# Patient Record
Sex: Female | Born: 1990 | Race: Black or African American | Hispanic: No | Marital: Single | Smoking: Never smoker
Health system: Southern US, Community
[De-identification: ages and names within clinical notes are randomized; demographics above are authoritative.]

## PROBLEM LIST (undated history)

## (undated) ENCOUNTER — Inpatient Hospital Stay (HOSPITAL_COMMUNITY): Payer: Self-pay

## (undated) DIAGNOSIS — Q613 Polycystic kidney, unspecified: Secondary | ICD-10-CM

## (undated) DIAGNOSIS — Z789 Other specified health status: Secondary | ICD-10-CM

---
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
## 2011-03-20 LAB — URINALYSIS, ROUTINE W REFLEX MICROSCOPIC
Glucose, UA: NEGATIVE
Ketones, ur: 15 — AB
Nitrite: NEGATIVE
Nitrite: POSITIVE — AB
Protein, ur: 30 — AB
Specific Gravity, Urine: >1.03 — ABNORMAL HIGH
Urobilinogen, UA: 0.2
Urobilinogen, UA: 1
Urobilinogen, UA: 0.2
pH: 6

## 2011-03-20 LAB — CBC
Hemoglobin: 11.4 — ABNORMAL LOW
MCHC: 34.3
RBC: 3.56 — ABNORMAL LOW
WBC: 9.4

## 2011-03-20 LAB — URINE MICROSCOPIC-ADD ON

## 2011-03-20 LAB — GC/CHLAMYDIA PROBE AMP, GENITAL
Chlamydia, DNA Probe: POSITIVE — AB
GC Probe Amp, Genital: NEGATIVE

## 2011-03-20 LAB — WET PREP, GENITAL

## 2011-03-20 LAB — URINE CULTURE

## 2011-03-20 LAB — PREGNANCY, URINE: Preg Test, Ur: POSITIVE

## 2011-03-20 LAB — HCG, QUANTITATIVE, PREGNANCY: hCG, Beta Chain, Quant, S: 51935 — ABNORMAL HIGH

## 2011-12-21 ENCOUNTER — Encounter (HOSPITAL_COMMUNITY): Payer: Self-pay | Admitting: *Deleted

## 2011-12-21 ENCOUNTER — Inpatient Hospital Stay (HOSPITAL_COMMUNITY)
Admission: AD | Admit: 2011-12-21 | Discharge: 2011-12-21 | Disposition: A | Discharge status: Home / Self Care | Payer: Self-pay | Source: Ambulatory Visit | Attending: Obstetrics and Gynecology | Admitting: Obstetrics and Gynecology

## 2011-12-21 DIAGNOSIS — O210 Mild hyperemesis gravidarum: Secondary | ICD-10-CM | POA: Insufficient documentation

## 2011-12-21 DIAGNOSIS — O219 Vomiting of pregnancy, unspecified: Secondary | ICD-10-CM

## 2011-12-21 LAB — URINE MICROSCOPIC-ADD ON

## 2011-12-21 LAB — POCT PREGNANCY, URINE: Preg Test, Ur: POSITIVE — AB

## 2011-12-21 LAB — URINALYSIS, ROUTINE W REFLEX MICROSCOPIC
Glucose, UA: NEGATIVE mg/dL
Ketones, ur: 15 mg/dL — AB
Nitrite: NEGATIVE
Protein, ur: 30 mg/dL — AB
Specific Gravity, Urine: >1.03 — ABNORMAL HIGH (ref 1.005–1.030)
Urobilinogen, UA: 0.2 mg/dL (ref 0.0–1.0)
pH: 6 (ref 5.0–8.0)

## 2011-12-21 MED ORDER — PROMETHAZINE HCL 25 MG PO TABS: 25.0000 mg | ORAL_TABLET | Freq: Four times a day (QID) | ORAL | Status: DC | PRN

## 2011-12-21 MED ORDER — PROMETHAZINE HCL 25 MG/ML IJ SOLN
25.0000 mg | Freq: Once | INTRAVENOUS | Status: AC
  Administered 2011-12-21: 25 mg via INTRAVENOUS
  Filled 2011-12-21: qty 1

## 2011-12-21 NOTE — Discharge Instructions (Signed)
Morning Sickness Morning sickness is when you feel sick to your stomach (nauseous) during pregnancy. This nauseous feeling may or may not come with throwing up (vomiting). It often occurs in the morning, but can be a problem any time of day. While morning sickness is unpleasant, it is usually harmless unless you develop severe and continual vomiting (hyperemesis gravidarum). This condition requires more intense treatment. CAUSES  The cause of morning sickness is not completely known but seems to be related to a sudden increase of two hormones:   Human chorionic gonadotropin (hCG).   Estrogen hormone.  These are elevated in the first part of the pregnancy. TREATMENT  Do not use any medicines (prescription, over-the-counter, or herbal) for morning sickness without first talking to your caregiver. Some patients are helped by the following:  Vitamin B6 (25mg every 8 hours) or vitamin B6 shots.   An antihistamine called doxylamine (10mg every 8 hours).   The herbal medication ginger.  HOME CARE INSTRUCTIONS   Taking multivitamins before getting pregnant can prevent or decrease the severity of morning sickness in most women.   Eat a piece of dry toast or unsalted crackers before getting out of bed in the morning.   Eat 5 or 6 small meals a day.   Eat dry and bland foods (rice, baked potato).   Do not drink liquids with your meals. Drink liquids between meals.   Avoid greasy, fatty, and spicy foods.   Get someone to cook for you if the smell of any food causes nausea and vomiting.   Avoid vitamin pills with iron because iron can cause nausea.   Snack on protein foods between meals if you are hungry.   Eat unsweetened gelatins for deserts.   Wear an acupressure wristband (worn for sea sickness) may be helpful.   Acupuncture may be helpful.   Do not smoke.   Get a humidifier to keep the air in your house free of odors.  SEEK MEDICAL CARE IF:   Your home remedies are not working  and you need medication.   You feel dizzy or lightheaded.   You are losing weight.   You need help with your diet.  SEEK IMMEDIATE MEDICAL CARE IF:   You have persistent and uncontrolled nausea and vomiting.   You pass out (faint).   You have a fever.  MAKE SURE YOU:   Understand these instructions.   Will watch your condition.   Will get help right away if you are not doing well or get worse.  Document Released: 07/31/2006 Document Revised: 05/29/2011 Document Reviewed: 05/28/2007 ExitCare Patient Information 2012 [employer]. 

## 2011-12-21 NOTE — MAU Provider Note (Signed)
  History     CSN: [id number]  Arrival date and time: 12/21/11 1438   First Provider Initiated Contact with Patient 12/21/11 1716      Chief Complaint  Patient presents with  . Emesis   HPI Colleen Oneill is a 21 y.o. G2P2002 at 6 1/[redacted] wks EGA.  She c/o nausea and vomiting x 3-4 days, vomiting 7-10x/day. No cramping, bleeding, change in discharge, UTI S&S.    History reviewed. No pertinent past medical history.  History reviewed. No pertinent past surgical history.  Family History  Problem Relation Age of Onset  . Other Neg Hx     History  Substance Use Topics  . Smoking status: Never Smoker   . Smokeless tobacco: Never Used  . Alcohol Use: No    Allergies: No Known Allergies  No prescriptions prior to admission    Review of Systems  Constitutional: Negative for fever and chills.  Gastrointestinal: Positive for nausea and vomiting. Negative for heartburn and abdominal pain.  Genitourinary: Negative for dysuria, urgency and frequency.   Physical Exam   Blood pressure 116/68, pulse 78, temperature 99.3 F (37.4 C), temperature source Oral, resp. rate 18, height 5\' 8"  (1.727 m), weight 131 lb 9.6 oz (59.693 kg), last menstrual period 11/09/2011.  Physical Exam  Constitutional: She is oriented to person, place, and time. She appears well-developed and well-nourished.  GI: Soft. There is no tenderness. There is no rebound and no guarding.  Musculoskeletal: Normal range of motion.  Neurological: She is alert and oriented to person, place, and time.  Skin: Skin is warm and dry.  Psychiatric: She has a normal mood and affect. Her behavior is normal.    MAU Course  Procedures  MDM IV in Pt feeling better, keeping crackers and soda down.   Assessment and Plan  Rx Phenergan tabs Make an appt with [facility] for care.  Bessye Stith, Olegario Messier M. 12/21/2011, 5:20 PM

## 2011-12-21 NOTE — MAU Note (Signed)
Pt reports having n/v x 3 days. Has missed period LMP 5/19. Denies pain or fever. States unable to keep anything down.

## 2011-12-28 NOTE — MAU Provider Note (Signed)
Attestation of Attending Supervision of Advanced Practitioner: Evaluation and management procedures were performed by the PA/NP/CNM/OB Fellow under my supervision/collaboration. Chart reviewed and agree with management and plan.  Ammy Lienhard V 12/28/2011 10:55 AM

## 2012-08-08 ENCOUNTER — Inpatient Hospital Stay (HOSPITAL_COMMUNITY)
Admission: AD | Admit: 2012-08-08 | Discharge: 2012-08-12 | DRG: 766 | Disposition: A | Discharge status: Home / Self Care | Payer: Medicaid Other | Source: Ambulatory Visit | Attending: Obstetrics | Admitting: Obstetrics

## 2012-08-08 ENCOUNTER — Encounter (HOSPITAL_COMMUNITY): Payer: Self-pay | Admitting: *Deleted

## 2012-08-08 DIAGNOSIS — O34219 Maternal care for unspecified type scar from previous cesarean delivery: Principal | ICD-10-CM | POA: Diagnosis present

## 2012-08-08 DIAGNOSIS — O9903 Anemia complicating the puerperium: Secondary | ICD-10-CM | POA: Diagnosis not present

## 2012-08-08 DIAGNOSIS — Z98891 History of uterine scar from previous surgery: Secondary | ICD-10-CM

## 2012-08-08 DIAGNOSIS — D649 Anemia, unspecified: Secondary | ICD-10-CM | POA: Diagnosis not present

## 2012-08-08 NOTE — MAU Note (Signed)
Pt states contractions every 7-8 min and feeling increased pressure

## 2012-08-09 ENCOUNTER — Inpatient Hospital Stay (HOSPITAL_COMMUNITY): Payer: Medicaid Other | Admitting: Registered Nurse

## 2012-08-09 ENCOUNTER — Encounter (HOSPITAL_COMMUNITY): Payer: Self-pay | Admitting: Registered Nurse

## 2012-08-09 ENCOUNTER — Encounter (HOSPITAL_COMMUNITY)
Admission: AD | Disposition: A | Discharge status: Home / Self Care | Payer: Self-pay | Source: Ambulatory Visit | Attending: Obstetrics

## 2012-08-09 LAB — CBC
MCH: 29.3 pg (ref 26.0–34.0)
MCHC: 33 g/dL (ref 30.0–36.0)
MCV: 88.8 fL (ref 78.0–100.0)
Platelets: 252 10*3/uL (ref 150–400)
RDW: 14.1 % (ref 11.5–15.5)

## 2012-08-09 LAB — RPR: RPR Ser Ql: NONREACTIVE

## 2012-08-09 LAB — TYPE AND SCREEN: Antibody Screen: NEGATIVE

## 2012-08-09 SURGERY — Surgical Case
Anesthesia: Spinal | Site: Abdomen | Wound class: Clean Contaminated

## 2012-08-09 MED ORDER — ONDANSETRON HCL 4 MG/2ML IJ SOLN
INTRAMUSCULAR | Status: DC | PRN
  Administered 2012-08-09: 4 mg via INTRAVENOUS

## 2012-08-09 MED ORDER — FENTANYL CITRATE 0.05 MG/ML IJ SOLN
INTRAMUSCULAR | Status: DC | PRN
  Administered 2012-08-09: 25 ug via INTRATHECAL

## 2012-08-09 MED ORDER — LACTATED RINGERS IV SOLN
INTRAVENOUS | Status: DC
  Administered 2012-08-09 (×2): via INTRAVENOUS

## 2012-08-09 MED ORDER — ZOLPIDEM TARTRATE 5 MG PO TABS: 5.0000 mg | ORAL_TABLET | Freq: Every evening | ORAL | Status: DC | PRN

## 2012-08-09 MED ORDER — SCOPOLAMINE 1 MG/3DAYS TD PT72
1.0000 | MEDICATED_PATCH | Freq: Once | TRANSDERMAL | Status: AC
  Administered 2012-08-09: 1.5 mg via TRANSDERMAL

## 2012-08-09 MED ORDER — DIPHENHYDRAMINE HCL 50 MG/ML IJ SOLN: 25.0000 mg | INTRAMUSCULAR | Status: DC | PRN

## 2012-08-09 MED ORDER — MORPHINE SULFATE (PF) 0.5 MG/ML IJ SOLN
INTRAMUSCULAR | Status: DC | PRN
  Administered 2012-08-09: .15 mg via INTRATHECAL

## 2012-08-09 MED ORDER — SODIUM CHLORIDE 0.9 % IJ SOLN: 3.0000 mL | INTRAMUSCULAR | Status: DC | PRN

## 2012-08-09 MED ORDER — TETANUS-DIPHTH-ACELL PERTUSSIS 5-2.5-18.5 LF-MCG/0.5 IM SUSP
0.5000 mL | Freq: Once | INTRAMUSCULAR | Status: AC
  Administered 2012-08-11: 0.5 mL via INTRAMUSCULAR
  Filled 2012-08-09: qty 0.5

## 2012-08-09 MED ORDER — HYDROMORPHONE HCL PF 1 MG/ML IJ SOLN: 0.5000 mg | Freq: Once | INTRAMUSCULAR | Status: DC

## 2012-08-09 MED ORDER — NALBUPHINE SYRINGE 5 MG/0.5 ML
5.0000 mg | INJECTION | INTRAMUSCULAR | Status: DC | PRN
  Filled 2012-08-09: qty 1

## 2012-08-09 MED ORDER — NALBUPHINE SYRINGE 5 MG/0.5 ML
INJECTION | INTRAMUSCULAR | Status: AC
  Administered 2012-08-09: 10 mg via SUBCUTANEOUS
  Filled 2012-08-09: qty 1

## 2012-08-09 MED ORDER — DIPHENHYDRAMINE HCL 25 MG PO CAPS: 25.0000 mg | ORAL_CAPSULE | ORAL | Status: DC | PRN

## 2012-08-09 MED ORDER — SCOPOLAMINE 1 MG/3DAYS TD PT72
MEDICATED_PATCH | TRANSDERMAL | Status: AC
  Administered 2012-08-09: 1.5 mg via TRANSDERMAL
  Filled 2012-08-09: qty 1

## 2012-08-09 MED ORDER — IBUPROFEN 600 MG PO TABS: 600.0000 mg | ORAL_TABLET | Freq: Four times a day (QID) | ORAL | Status: DC | PRN

## 2012-08-09 MED ORDER — MEPERIDINE HCL 25 MG/ML IJ SOLN: 6.2500 mg | INTRAMUSCULAR | Status: DC | PRN

## 2012-08-09 MED ORDER — LANOLIN HYDROUS EX OINT: 1.0000 "application " | TOPICAL_OINTMENT | CUTANEOUS | Status: DC | PRN

## 2012-08-09 MED ORDER — CEFAZOLIN SODIUM-DEXTROSE 2-3 GM-% IV SOLR
2.0000 g | Freq: Once | INTRAVENOUS | Status: AC
  Administered 2012-08-09: 2 g via INTRAVENOUS
  Filled 2012-08-09: qty 50

## 2012-08-09 MED ORDER — ONDANSETRON HCL 4 MG/2ML IJ SOLN: 4.0000 mg | Freq: Three times a day (TID) | INTRAMUSCULAR | Status: DC | PRN

## 2012-08-09 MED ORDER — IBUPROFEN 600 MG PO TABS
600.0000 mg | ORAL_TABLET | Freq: Four times a day (QID) | ORAL | Status: DC
  Administered 2012-08-10: 600 mg via ORAL
  Filled 2012-08-09 (×2): qty 1

## 2012-08-09 MED ORDER — KETOROLAC TROMETHAMINE 30 MG/ML IJ SOLN
30.0000 mg | Freq: Four times a day (QID) | INTRAMUSCULAR | Status: AC | PRN
  Administered 2012-08-09: 30 mg via INTRAVENOUS
  Filled 2012-08-09: qty 1

## 2012-08-09 MED ORDER — LACTATED RINGERS IV SOLN
40.0000 [IU] | INTRAVENOUS | Status: DC | PRN
  Administered 2012-08-09: 40 [IU] via INTRAVENOUS

## 2012-08-09 MED ORDER — HYDROMORPHONE HCL PF 1 MG/ML IJ SOLN
0.2500 mg | INTRAMUSCULAR | Status: DC | PRN
  Administered 2012-08-09: 0.5 mg via INTRAVENOUS

## 2012-08-09 MED ORDER — ONDANSETRON HCL 4 MG PO TABS: 4.0000 mg | ORAL_TABLET | ORAL | Status: DC | PRN

## 2012-08-09 MED ORDER — OXYTOCIN 40 UNITS IN LACTATED RINGERS INFUSION - SIMPLE MED: 62.5000 mL/h | INTRAVENOUS | Status: AC

## 2012-08-09 MED ORDER — NALOXONE HCL 1 MG/ML IJ SOLN
1.0000 ug/kg/h | INTRAVENOUS | Status: DC | PRN
  Filled 2012-08-09: qty 2

## 2012-08-09 MED ORDER — METOCLOPRAMIDE HCL 5 MG/ML IJ SOLN: 10.0000 mg | Freq: Three times a day (TID) | INTRAMUSCULAR | Status: DC | PRN

## 2012-08-09 MED ORDER — PHENYLEPHRINE HCL 10 MG/ML IJ SOLN
INTRAMUSCULAR | Status: DC | PRN
  Administered 2012-08-09 (×2): 40 ug via INTRAVENOUS
  Administered 2012-08-09: 80 ug via INTRAVENOUS

## 2012-08-09 MED ORDER — LACTATED RINGERS IV SOLN
INTRAVENOUS | Status: DC
  Administered 2012-08-09 (×3): via INTRAVENOUS

## 2012-08-09 MED ORDER — NALBUPHINE SYRINGE 5 MG/0.5 ML
5.0000 mg | INJECTION | INTRAMUSCULAR | Status: DC | PRN
  Administered 2012-08-09: 10 mg via SUBCUTANEOUS
  Filled 2012-08-09: qty 1

## 2012-08-09 MED ORDER — KETOROLAC TROMETHAMINE 60 MG/2ML IM SOLN
60.0000 mg | Freq: Once | INTRAMUSCULAR | Status: AC | PRN
  Administered 2012-08-09: 60 mg via INTRAMUSCULAR

## 2012-08-09 MED ORDER — MEPERIDINE HCL 25 MG/ML IJ SOLN
INTRAMUSCULAR | Status: DC | PRN
  Administered 2012-08-09: 25 mg via INTRAVENOUS

## 2012-08-09 MED ORDER — PRENATAL MULTIVITAMIN CH
1.0000 | ORAL_TABLET | Freq: Every day | ORAL | Status: DC
  Administered 2012-08-10 – 2012-08-12 (×3): 1 via ORAL
  Filled 2012-08-09 (×3): qty 1

## 2012-08-09 MED ORDER — KETOROLAC TROMETHAMINE 60 MG/2ML IM SOLN
INTRAMUSCULAR | Status: AC
  Administered 2012-08-09: 60 mg via INTRAMUSCULAR
  Filled 2012-08-09: qty 2

## 2012-08-09 MED ORDER — MENTHOL 3 MG MT LOZG: 1.0000 | LOZENGE | OROMUCOSAL | Status: DC | PRN

## 2012-08-09 MED ORDER — CITRIC ACID-SODIUM CITRATE 334-500 MG/5ML PO SOLN
30.0000 mL | Freq: Once | ORAL | Status: AC
  Administered 2012-08-09: 30 mL via ORAL
  Filled 2012-08-09: qty 15

## 2012-08-09 MED ORDER — DIPHENHYDRAMINE HCL 50 MG/ML IJ SOLN: 12.5000 mg | INTRAMUSCULAR | Status: DC | PRN

## 2012-08-09 MED ORDER — WITCH HAZEL-GLYCERIN EX PADS: 1.0000 "application " | MEDICATED_PAD | CUTANEOUS | Status: DC | PRN

## 2012-08-09 MED ORDER — DIBUCAINE 1 % RE OINT: 1.0000 "application " | TOPICAL_OINTMENT | RECTAL | Status: DC | PRN

## 2012-08-09 MED ORDER — 0.9 % SODIUM CHLORIDE (POUR BTL) OPTIME
TOPICAL | Status: DC | PRN
  Administered 2012-08-09: 400 mL

## 2012-08-09 MED ORDER — FAMOTIDINE IN NACL 20-0.9 MG/50ML-% IV SOLN
20.0000 mg | Freq: Once | INTRAVENOUS | Status: AC
  Administered 2012-08-09: 20 mg via INTRAVENOUS
  Filled 2012-08-09: qty 50

## 2012-08-09 MED ORDER — SIMETHICONE 80 MG PO CHEW
80.0000 mg | CHEWABLE_TABLET | Freq: Three times a day (TID) | ORAL | Status: DC
  Administered 2012-08-09 – 2012-08-12 (×11): 80 mg via ORAL

## 2012-08-09 MED ORDER — SENNOSIDES-DOCUSATE SODIUM 8.6-50 MG PO TABS
2.0000 | ORAL_TABLET | Freq: Every day | ORAL | Status: DC
  Administered 2012-08-11: 2 via ORAL

## 2012-08-09 MED ORDER — ONDANSETRON HCL 4 MG/2ML IJ SOLN: 4.0000 mg | INTRAMUSCULAR | Status: DC | PRN

## 2012-08-09 MED ORDER — KETOROLAC TROMETHAMINE 30 MG/ML IJ SOLN: 30.0000 mg | Freq: Four times a day (QID) | INTRAMUSCULAR | Status: AC | PRN

## 2012-08-09 MED ORDER — HYDROMORPHONE HCL PF 1 MG/ML IJ SOLN
INTRAMUSCULAR | Status: AC
  Administered 2012-08-09: 0.5 mg via INTRAVENOUS
  Filled 2012-08-09: qty 1

## 2012-08-09 MED ORDER — SIMETHICONE 80 MG PO CHEW: 80.0000 mg | CHEWABLE_TABLET | ORAL | Status: DC | PRN

## 2012-08-09 MED ORDER — OXYCODONE-ACETAMINOPHEN 5-325 MG PO TABS
1.0000 | ORAL_TABLET | ORAL | Status: DC | PRN
  Administered 2012-08-09 – 2012-08-10 (×3): 1 via ORAL
  Filled 2012-08-09 (×3): qty 1

## 2012-08-09 MED ORDER — DIPHENHYDRAMINE HCL 25 MG PO CAPS: 25.0000 mg | ORAL_CAPSULE | Freq: Four times a day (QID) | ORAL | Status: DC | PRN

## 2012-08-09 MED ORDER — NALOXONE HCL 0.4 MG/ML IJ SOLN: 0.4000 mg | INTRAMUSCULAR | Status: DC | PRN

## 2012-08-09 MED ORDER — TERBUTALINE SULFATE 1 MG/ML IJ SOLN
0.2500 mg | Freq: Once | INTRAMUSCULAR | Status: AC
  Administered 2012-08-09: 0.25 mg via SUBCUTANEOUS
  Filled 2012-08-09: qty 1

## 2012-08-09 SURGICAL SUPPLY — 30 items
CLOTH BEACON ORANGE TIMEOUT ST (SAFETY) ×2 IMPLANT
DERMABOND ADVANCED (GAUZE/BANDAGES/DRESSINGS)
DERMABOND ADVANCED .7 DNX12 (GAUZE/BANDAGES/DRESSINGS) IMPLANT
DRAPE LG THREE QUARTER DISP (DRAPES) ×2 IMPLANT
DRSG OPSITE POSTOP 4X10 (GAUZE/BANDAGES/DRESSINGS) ×2 IMPLANT
DURAPREP 26ML APPLICATOR (WOUND CARE) ×2 IMPLANT
ELECT REM PT RETURN 9FT ADLT (ELECTROSURGICAL) ×2
ELECTRODE REM PT RTRN 9FT ADLT (ELECTROSURGICAL) ×1 IMPLANT
EXTRACTOR VACUUM M CUP 4 TUBE (SUCTIONS) IMPLANT
GLOVE BIO SURGEON STRL SZ8.5 (GLOVE) ×4 IMPLANT
GOWN PREVENTION PLUS LG XLONG (DISPOSABLE) ×4 IMPLANT
GOWN PREVENTION PLUS XXLARGE (GOWN DISPOSABLE) ×2 IMPLANT
KIT ABG SYR 3ML LUER SLIP (SYRINGE) IMPLANT
NEEDLE HYPO 25X5/8 SAFETYGLIDE (NEEDLE) IMPLANT
NS IRRIG 1000ML POUR BTL (IV SOLUTION) ×2 IMPLANT
PACK C SECTION WH (CUSTOM PROCEDURE TRAY) ×2 IMPLANT
PAD OB MATERNITY 4.3X12.25 (PERSONAL CARE ITEMS) ×2 IMPLANT
SLEEVE SCD COMPRESS KNEE MED (MISCELLANEOUS) ×2 IMPLANT
SUT CHROMIC 0 CT 802H (SUTURE) ×2 IMPLANT
SUT CHROMIC 1 CTX 36 (SUTURE) ×8 IMPLANT
SUT CHROMIC 2 0 SH (SUTURE) ×2 IMPLANT
SUT GUT PLAIN 0 CT-3 TAN 27 (SUTURE) IMPLANT
SUT MON AB 4-0 PS1 27 (SUTURE) ×2 IMPLANT
SUT VIC AB 0 CT1 18XCR BRD8 (SUTURE) IMPLANT
SUT VIC AB 0 CT1 8-18 (SUTURE)
SUT VIC AB 0 CTX 36 (SUTURE) ×2
SUT VIC AB 0 CTX36XBRD ANBCTRL (SUTURE) ×2 IMPLANT
TOWEL OR 17X24 6PK STRL BLUE (TOWEL DISPOSABLE) ×6 IMPLANT
TRAY FOLEY CATH 14FR (SET/KITS/TRAYS/PACK) ×2 IMPLANT
WATER STERILE IRR 1000ML POUR (IV SOLUTION) ×2 IMPLANT

## 2012-08-09 NOTE — MAU Note (Signed)
FHT 145-150 x 3 min after spinal placement

## 2012-08-09 NOTE — H&P (Signed)
This is Dr. Francoise Ceo dictating the history and physical on  Colleen Oneill she's a 22 year old gravida 3 para 2002 at 32 weeks and 1 day [facility] 08/15/2012 negative GBS the patient had 1 vaginal delivery and and a section and she is now in early labor and desires repeat section on admission cervix 2 cm and then she changed to 3-4 and she got terbutaline because she had eaten 4 hours prior to admission to the hospital and for a C-section this a.m. Past medical history negative Past surgical history 1 C-section Social history negative System review noncontributory Physical exam revealed a well-developed female in no distress HEENT negative Lungs clear to P&A Heart regular rhythm no murmurs no gallops Breasts negative Abdomen term Pelvic as described above Extremities negative

## 2012-08-09 NOTE — Anesthesia Procedure Notes (Signed)

## 2012-08-09 NOTE — Anesthesia Postprocedure Evaluation (Signed)
  Anesthesia Post-op Note  Patient: Colleen Oneill  Procedure(s) Performed: Procedure(s): Repeat CESAREAN SECTION of baby boy  at 11  APGAR 9/9 (N/A)  Patient Location: Mother/Baby  Anesthesia Type:Spinal  Level of Consciousness: awake, alert  and oriented  Airway and Oxygen Therapy: Patient Spontanous Breathing  Post-op Pain: none  Post-op Assessment: Post-op Vital signs reviewed, Patient's Cardiovascular Status Stable, No headache, No backache, No residual numbness and No residual motor weakness  Post-op Vital Signs: Reviewed and stable  Complications: No apparent anesthesia complications

## 2012-08-09 NOTE — MAU Note (Signed)
To prepare pt for a C/s at 0400-to give her 8 hrs since eating-Darlene,RN in OR aware and sttes she will tell Dr Sherron Ales

## 2012-08-09 NOTE — Preoperative (Signed)
Beta Blockers   Reason not to administer Beta Blockers:Not Applicable 

## 2012-08-09 NOTE — Anesthesia Preprocedure Evaluation (Addendum)

## 2012-08-09 NOTE — Transfer of Care (Signed)
Immediate Anesthesia Transfer of Care Note  Patient: Colleen Oneill  Procedure(s) Performed: Procedure(s): Repeat CESAREAN SECTION of baby boy  at 21  APGAR 9/9 (N/A)  Patient Location: PACU  Anesthesia Type:Spinal  Level of Consciousness: awake, alert  and oriented  Airway & Oxygen Therapy: Patient Spontanous Breathing  Post-op Assessment: Report given to PACU RN  Post vital signs: Reviewed  Complications: No apparent anesthesia complications

## 2012-08-09 NOTE — Anesthesia Postprocedure Evaluation (Signed)
  Anesthesia Post-op Note  Patient: Colleen Oneill  Procedure(s) Performed: Procedure(s): Repeat CESAREAN SECTION of baby boy  at 70  APGAR 9/9 (N/A)   Patient is awake, responsive, moving her legs, and has signs of resolution of her numbness. Pain and nausea are reasonably well controlled. Vital signs are stable and clinically acceptable. Oxygen saturation is clinically acceptable. There are no apparent anesthetic complications at this time. Patient is ready for discharge.

## 2012-08-09 NOTE — Op Note (Signed)
preop diagnosis previous cesarean section at term in labor desires repeat Postop diagnosis is same Anesthesia spinal Surgeon Dr. Francoise Ceo Procedure patient placed on the operating table in the supine position abdomen prepped and draped bladder emptied with a Foley catheter transverse suprapubic incision made through the old scar carried to the rectus fascia fascia cleaned and incised the length of the incision recti muscles retracted laterally peritoneum incised longitudinally transverse incision made on the visceroperitoneum above the bladder bladder mobilized inferiorly transverse low uterine incision made fluid clear patient delivered from the LOA position of a female Apgar 99 weighing 7 lbs. 10 oz. Placenta removed manually anteriorly and sent to labor and delivery uterine cavity clean with dry laps uterine incision closed in 2 layers with continuous suture  of #1 chromic hemostasis satisfactory the lap and sponge column peritoneum continuous with 2-0 chromic fascia continuous within of 0 Dexon and the skin shows a subcuticular stitch of 4-0 Monocryl blood loss was 700 cc patient tolerated the procedure well

## 2012-08-09 NOTE — OR Nursing (Signed)
50 ml blood loss during fundal massage by DLWegner RN, cord blood x 2 to nursery

## 2012-08-10 ENCOUNTER — Encounter (HOSPITAL_COMMUNITY): Payer: Self-pay | Admitting: Obstetrics

## 2012-08-10 LAB — CBC
HCT: 24.2 % — ABNORMAL LOW (ref 36.0–46.0)
MCH: 29 pg (ref 26.0–34.0)
MCHC: 32.6 g/dL (ref 30.0–36.0)
MCV: 89 fL (ref 78.0–100.0)
RDW: 14.4 % (ref 11.5–15.5)

## 2012-08-10 MED ORDER — OXYCODONE-ACETAMINOPHEN 5-325 MG/5ML PO SOLN
5.0000 mL | ORAL | Status: DC | PRN
  Administered 2012-08-10 – 2012-08-12 (×4): 5 mL via ORAL
  Filled 2012-08-10 (×4): qty 5

## 2012-08-10 MED ORDER — IBUPROFEN 100 MG/5ML PO SUSP
600.0000 mg | Freq: Four times a day (QID) | ORAL | Status: DC
  Administered 2012-08-10 – 2012-08-12 (×7): 600 mg via ORAL
  Filled 2012-08-10 (×10): qty 30

## 2012-08-10 NOTE — Progress Notes (Signed)
UR completed 

## 2012-08-10 NOTE — Progress Notes (Signed)
Subjective: Postpartum Day 1: Cesarean Delivery Patient reports tolerating PO, + flatus and no problems voiding.    Objective: Vital signs in last 24 hours: Temp:  [97.5 F (36.4 C)-99 F (37.2 C)] 98.3 F (36.8 C) (02/18 0553) Pulse Rate:  [62-96] 92 (02/18 0553) Resp:  [18-20] 18 (02/18 0553) BP: (110-122)/(52-74) 122/71 mmHg (02/18 0553) SpO2:  [95 %-99 %] 99 % (02/18 0030)  Physical Exam:  General: alert and no distress Lochia: appropriate Uterine Fundus: firm Incision: healing well DVT Evaluation: No evidence of DVT seen on physical exam.   Recent Labs  08/09/12 0043 08/10/12 0515  HGB 10.2* 7.9*  HCT 30.9* 24.2*    Assessment/Plan: Status post Cesarean section. Doing well postoperatively.   Anemia.  Clinically stable.  Iron Rx. Continue current care.  HARPER,CHARLES A 08/10/2012, 9:11 AM

## 2012-08-10 NOTE — Clinical Social Work Maternal (Signed)
Clinical Social Work Department PSYCHOSOCIAL ASSESSMENT - MATERNAL/CHILD 08/10/2012  Patient:  Colleen, Oneill  Account Number:  [id number]  Admit Date:  08/08/2012  Marjo Bicker Name:   Colleen Oneill    Clinical Social Worker:  Nobie Putnam, LCSW   Date/Time:  08/10/2012 12:00 N  Date Referred:  08/10/2012   Referral source  CN     Referred reason  Other - See comment   Other referral source:    I:  FAMILY / HOME ENVIRONMENT Child's legal guardian:  PARENT  Guardian - Name Guardian - Age Guardian - Address  Colleen Oneill [address]; [place], [place] [address]  Colleen Oneill 24 (same as above)   Other household support members/support persons Name Relationship DOB  Colleen Oneill OTHER FOB's mother   SISTER 34 year old twins (FOB)   BROTHER 49 year old (FOB)   Other support:   Colleen Oneill, mother    II  PSYCHOSOCIAL DATA Information Source:  Patient Interview  Event organiser Employment:   Surveyor, quantity resources:  Self Pay If Medicaid - [employer]:   Clinical biochemist  WIC   School / Grade:   Maternity Care Coordinator / Child Services Coordination / Early Interventions:  Cultural issues impacting care:    III  STRENGTHS Strengths  Adequate Resources  Home prepared for Child (including basic supplies)  Supportive family/friends   Strength comment:    IV  RISK FACTORS AND CURRENT PROBLEMS Current Problem:  YES   Risk Factor & Current Problem Patient Issue Family Issue Risk Factor / Current Problem Comment  Knowledge/Cognitive Deficit Y N     V  SOCIAL WORK ASSESSMENT CSW met with 22 year old, G3P3 referred for an assessment of her current social situation & concern about her inability to care for this baby, as per her mother.  Pt is currently living with the FOB & his family.  Her other children, ages 2 & 4, live with her mother, Colleen Oneill.  Pt made a voluntary plan to place the children with her  mother.  Pt gave this CSW verbal permission  to contact her mother to discuss her situation further.  Pt's mother was only able to speak to this CSW briefly, as she was working.  Colleen Oneill told CSW that she is concerned that this infant would "go without," if discharged with her daughter.  She also told CSW that FOB does not "treat her well," & can not provide for this baby.  CSW confirmed that pt does have an open [facility] CPS with Colleen Oneill at this time however Colleen Oneill does not have any safety concerns about this pt parenting.  The CPS worker did express concerns about this pt's lack of resources & unstable housing situation.  CSW also spoke with Colleen Oneill, Maternity Child Coordinator, [facility]), to ask if any concerns were noted. She told CSW that pt was hard to contact and was never given a direct # to the pt.  She also agrees that this pt lack resources.  According to staff, pt is providing appropriate care to the infant.  She reports having all the necessary supplies for the infant and good support.  Pt denies history of depression/SI.  Pt does appear to be cognitively delayed but is not able to verbalize a formal diagnoses.  She does not receive disability benefits.  FOB was at the bedside and appears to be supportive.  After speaking with pt's mother, current CPS worker & [facility],  this  CSW reported concerns to CPS & requested that additional services be put in place to help this pt parent appropriately & ensure stability of infant .  CSW will continue follow and assist further as needed until discharged.      VI SOCIAL WORK PLAN Social Work Plan  No Further Intervention Required / No Barriers to Discharge   Type of pt/family education:   If child protective services report - county:  [place] If child protective services report - date:  08/10/2012 Information/referral to community resources comment:   Other social work plan:

## 2012-08-10 NOTE — Progress Notes (Signed)
CPS accepted the case and assigned current worker, Dawn Brown. She plans to come visit with the pt & infant tomorrow.   

## 2012-08-11 NOTE — Progress Notes (Signed)
Subjective: Postpartum Day 2: Cesarean Delivery Patient reports tolerating PO, + flatus and no problems voiding.    Objective: Vital signs in last 24 hours: Temp:  [98.3 F (36.8 C)] 98.3 F (36.8 C) (02/18 2250) Pulse Rate:  [92-96] 96 (02/18 2250) Resp:  [18] 18 (02/18 2250) BP: (104-122)/(65-71) 104/65 mmHg (02/18 2250) SpO2:  [95 %] 95 % (02/18 2250)  Physical Exam:  General: alert and no distress Lochia: appropriate Uterine Fundus: firm Incision: healing well DVT Evaluation: No evidence of DVT seen on physical exam.   Recent Labs  08/09/12 0043 08/10/12 0515  HGB 10.2* 7.9*  HCT 30.9* 24.2*    Assessment/Plan: Status post Cesarean section. Doing well postoperatively.   Anemia.  Clinically stable.  Iron started. Continue current care.  HARPER,CHARLES A 08/11/2012, 5:02 AM

## 2012-08-12 MED ORDER — FERROUS SULFATE 300 (60 FE) MG/5ML PO SYRP: 300.0000 mg | ORAL_SOLUTION | Freq: Two times a day (BID) | ORAL | Status: DC

## 2012-08-12 MED ORDER — OXYCODONE-ACETAMINOPHEN 5-325 MG/5ML PO SOLN: 5.0000 mL | ORAL | Status: DC | PRN

## 2012-08-12 MED ORDER — IBUPROFEN 100 MG/5ML PO SUSP: 600.0000 mg | Freq: Four times a day (QID) | ORAL | Status: DC | PRN

## 2012-08-12 NOTE — Progress Notes (Signed)
Subjective: Postpartum Day 3: Cesarean Delivery Patient reports tolerating PO, + flatus, + BM and no problems voiding.    Objective: Vital signs in last 24 hours: Temp:  [97.9 F (36.6 C)-98.1 F (36.7 C)] 98.1 F (36.7 C) (02/19 1825) Pulse Rate:  [85-93] 93 (02/19 1825) Resp:  [18] 18 (02/19 1825) BP: (99-128)/(63-77) 128/77 mmHg (02/19 1825)  Physical Exam:  General: alert and no distress Lochia: appropriate Uterine Fundus: firm Incision: healing well DVT Evaluation: No evidence of DVT seen on physical exam.   Recent Labs  08/10/12 0515  HGB 7.9*  HCT 24.2*    Assessment/Plan: Status post Cesarean section. Doing well postoperatively.  Discharge home with standard precautions and return to clinic in 2 weeks.  HARPER,CHARLES A 08/12/2012, 4:47 AM

## 2012-08-12 NOTE — Discharge Summary (Signed)
Obstetric Discharge Summary Reason for Admission: onset of labor and cesarean section Prenatal Procedures: ultrasound Intrapartum Procedures: cesarean: low cervical, transverse Postpartum Procedures: none Complications-Operative and Postpartum: none Hemoglobin  Date Value Range Status  08/10/2012 7.9* 12.0 - 15.0 g/dL Final     REPEATED TO VERIFY     DELTA CHECK NOTED     HCT  Date Value Range Status  08/10/2012 24.2* 36.0 - 46.0 % Final    Physical Exam:  General: alert and no distress Lochia: appropriate Uterine Fundus: firm Incision: healing well DVT Evaluation: No evidence of DVT seen on physical exam.  Discharge Diagnoses: Term Pregnancy-delivered  Discharge Information: Date: 08/12/2012 Activity: pelvic rest Diet: routine Medications: PNV, Ibuprofen, Iron and Percocet Condition: stable Instructions: refer to practice specific booklet Discharge to: home Follow-up Information   Follow up with Arion Morgan A, MD. Schedule an appointment as soon as possible for a visit in 2 weeks.   Contact information:   [address] [place] [address] [phone]       Newborn Data: Live born female  Birth Weight: 7 lb 10.2 oz (3465 g) APGAR: 9, 9  Home with mother.  Loree Shehata A 08/12/2012, 5:02 AM

## 2012-11-18 ENCOUNTER — Encounter (HOSPITAL_COMMUNITY): Payer: Self-pay | Admitting: *Deleted

## 2012-11-18 DIAGNOSIS — Z3202 Encounter for pregnancy test, result negative: Secondary | ICD-10-CM | POA: Insufficient documentation

## 2012-11-18 DIAGNOSIS — R509 Fever, unspecified: Secondary | ICD-10-CM | POA: Insufficient documentation

## 2012-11-18 DIAGNOSIS — R109 Unspecified abdominal pain: Secondary | ICD-10-CM | POA: Insufficient documentation

## 2012-11-18 LAB — URINE MICROSCOPIC-ADD ON

## 2012-11-18 LAB — COMPREHENSIVE METABOLIC PANEL
ALT: 8 U/L (ref 0–35)
AST: 15 U/L (ref 0–37)
Albumin: 4.1 g/dL (ref 3.5–5.2)
Alkaline Phosphatase: 43 U/L (ref 39–117)
CO2: 20 mEq/L (ref 19–32)
Chloride: 100 mEq/L (ref 96–112)
GFR calc non Af Amer: >90 mL/min (ref >90)
Potassium: 3 mEq/L — ABNORMAL LOW (ref 3.5–5.1)
Sodium: 135 mEq/L (ref 135–145)
Total Bilirubin: 0.4 mg/dL (ref 0.3–1.2)

## 2012-11-18 LAB — CBC WITH DIFFERENTIAL/PLATELET
Basophils Absolute: 0 10*3/uL (ref 0.0–0.1)
Basophils Relative: 0 % (ref 0–1)
HCT: 32.1 % — ABNORMAL LOW (ref 36.0–46.0)
Lymphocytes Relative: 12 % (ref 12–46)
Monocytes Absolute: 0.5 10*3/uL (ref 0.1–1.0)
Neutro Abs: 6.8 10*3/uL (ref 1.7–7.7)
Neutrophils Relative %: 82 % — ABNORMAL HIGH (ref 43–77)
Platelets: 182 10*3/uL (ref 150–400)
RDW: 13.6 % (ref 11.5–15.5)
WBC: 8.3 10*3/uL (ref 4.0–10.5)

## 2012-11-18 LAB — URINALYSIS, ROUTINE W REFLEX MICROSCOPIC
Bilirubin Urine: NEGATIVE
Glucose, UA: NEGATIVE mg/dL
Protein, ur: 100 mg/dL — AB
Urobilinogen, UA: 4 mg/dL — ABNORMAL HIGH (ref 0.0–1.0)

## 2012-11-18 MED ORDER — ACETAMINOPHEN 325 MG PO TABS
650.0000 mg | ORAL_TABLET | Freq: Once | ORAL | Status: AC
  Administered 2012-11-18: 650 mg via ORAL
  Filled 2012-11-18: qty 2

## 2012-11-18 MED ORDER — ACETAMINOPHEN 160 MG/5ML PO SOLN
ORAL | Status: AC
  Filled 2012-11-18: qty 20.3

## 2012-11-18 MED ORDER — ONDANSETRON 4 MG PO TBDP
8.0000 mg | ORAL_TABLET | Freq: Once | ORAL | Status: AC
  Administered 2012-11-18: 8 mg via ORAL
  Filled 2012-11-18: qty 2

## 2012-11-18 NOTE — ED Notes (Signed)
Triage RN notified of pt's temperature

## 2012-11-18 NOTE — ED Notes (Signed)
Pt reports vomiting, fever and (L) flank pain that started yesterday.  Denies meds PTA.

## 2012-11-18 NOTE — ED Notes (Signed)
NURSE FIRST ROUNDS : NURSE EXPLAINED DELAY , WAIT TIME AND PROCESS , FEVER IMPROVED , DENIES PAIN AT THIS TIME , RESPIRATIONS UNLABORED , SITTING WITH FAMILY AT WAITING AREA WITH NO DISTRESS.

## 2012-11-19 ENCOUNTER — Emergency Department (HOSPITAL_COMMUNITY)
Admission: EM | Admit: 2012-11-19 | Discharge: 2012-11-19 | Disposition: A | Discharge status: Home / Self Care | Payer: Self-pay | Attending: Emergency Medicine | Admitting: Emergency Medicine

## 2012-11-19 ENCOUNTER — Emergency Department (HOSPITAL_COMMUNITY)
Admission: EM | Admit: 2012-11-19 | Discharge: 2012-11-19 | Discharge status: Against Medical Advice | Payer: Self-pay | Attending: Emergency Medicine | Admitting: Emergency Medicine

## 2012-11-19 ENCOUNTER — Encounter (HOSPITAL_COMMUNITY): Payer: Self-pay | Admitting: Emergency Medicine

## 2012-11-19 DIAGNOSIS — Z79899 Other long term (current) drug therapy: Secondary | ICD-10-CM | POA: Insufficient documentation

## 2012-11-19 DIAGNOSIS — R1031 Right lower quadrant pain: Secondary | ICD-10-CM | POA: Insufficient documentation

## 2012-11-19 DIAGNOSIS — R630 Anorexia: Secondary | ICD-10-CM | POA: Insufficient documentation

## 2012-11-19 DIAGNOSIS — N12 Tubulo-interstitial nephritis, not specified as acute or chronic: Secondary | ICD-10-CM | POA: Insufficient documentation

## 2012-11-19 DIAGNOSIS — R509 Fever, unspecified: Secondary | ICD-10-CM | POA: Insufficient documentation

## 2012-11-19 DIAGNOSIS — R112 Nausea with vomiting, unspecified: Secondary | ICD-10-CM | POA: Insufficient documentation

## 2012-11-19 LAB — URINE MICROSCOPIC-ADD ON

## 2012-11-19 LAB — URINALYSIS, ROUTINE W REFLEX MICROSCOPIC
Ketones, ur: 15 mg/dL — AB
Nitrite: NEGATIVE
Protein, ur: 100 mg/dL — AB
Urobilinogen, UA: 2 mg/dL — ABNORMAL HIGH (ref 0.0–1.0)

## 2012-11-19 LAB — BASIC METABOLIC PANEL
BUN: 13 mg/dL (ref 6–23)
Calcium: 9.1 mg/dL (ref 8.4–10.5)
Creatinine, Ser: 0.65 mg/dL (ref 0.50–1.10)
GFR calc Af Amer: >90 mL/min (ref >90)
GFR calc non Af Amer: >90 mL/min (ref >90)
Potassium: 2.9 mEq/L — ABNORMAL LOW (ref 3.5–5.1)

## 2012-11-19 LAB — CBC WITH DIFFERENTIAL/PLATELET
Basophils Relative: 0 % (ref 0–1)
Eosinophils Absolute: 0 10*3/uL (ref 0.0–0.7)
Eosinophils Relative: 0 % (ref 0–5)
Hemoglobin: 10.5 g/dL — ABNORMAL LOW (ref 12.0–15.0)
MCH: 28.5 pg (ref 26.0–34.0)
MCHC: 33.7 g/dL (ref 30.0–36.0)
MCV: 84.8 fL (ref 78.0–100.0)
Monocytes Absolute: 0.7 10*3/uL (ref 0.1–1.0)
Monocytes Relative: 8 % (ref 3–12)
Neutrophils Relative %: 80 % — ABNORMAL HIGH (ref 43–77)

## 2012-11-19 MED ORDER — SULFAMETHOXAZOLE-TRIMETHOPRIM 800-160 MG PO TABS: 1.0000 | ORAL_TABLET | Freq: Two times a day (BID) | ORAL | Status: DC

## 2012-11-19 MED ORDER — ONDANSETRON HCL 4 MG PO TABS: 4.0000 mg | ORAL_TABLET | Freq: Four times a day (QID) | ORAL | Status: DC

## 2012-11-19 MED ORDER — POTASSIUM CHLORIDE 10 MEQ/100ML IV SOLN
10.0000 meq | Freq: Once | INTRAVENOUS | Status: AC
  Administered 2012-11-19: 10 meq via INTRAVENOUS
  Filled 2012-11-19: qty 100

## 2012-11-19 MED ORDER — ONDANSETRON HCL 4 MG/2ML IJ SOLN
4.0000 mg | Freq: Once | INTRAMUSCULAR | Status: AC
  Administered 2012-11-19: 4 mg via INTRAVENOUS
  Filled 2012-11-19: qty 2

## 2012-11-19 MED ORDER — CEPHALEXIN 500 MG PO CAPS: 500.0000 mg | ORAL_CAPSULE | Freq: Three times a day (TID) | ORAL | Status: DC

## 2012-11-19 MED ORDER — DEXTROSE 5 % IV SOLN
1.0000 g | Freq: Once | INTRAVENOUS | Status: AC
  Administered 2012-11-19: 1 g via INTRAVENOUS
  Filled 2012-11-19: qty 10

## 2012-11-19 MED ORDER — SODIUM CHLORIDE 0.9 % IV BOLUS (SEPSIS)
1000.0000 mL | Freq: Once | INTRAVENOUS | Status: AC
  Administered 2012-11-19: 1000 mL via INTRAVENOUS

## 2012-11-19 MED ORDER — POTASSIUM CHLORIDE CRYS ER 20 MEQ PO TBCR
40.0000 meq | EXTENDED_RELEASE_TABLET | Freq: Once | ORAL | Status: AC
  Administered 2012-11-19: 40 meq via ORAL
  Filled 2012-11-19: qty 2

## 2012-11-19 MED ORDER — MORPHINE SULFATE 4 MG/ML IJ SOLN
4.0000 mg | Freq: Once | INTRAMUSCULAR | Status: AC
  Administered 2012-11-19: 4 mg via INTRAVENOUS
  Filled 2012-11-19: qty 1

## 2012-11-19 NOTE — ED Provider Notes (Signed)
History     CSN: [id number]  Arrival date & time 11/19/12  1102   First MD Initiated Contact with Patient 11/19/12 1108      Chief Complaint  Patient presents with  . Urinary Tract Infection    (Consider location/radiation/quality/duration/timing/severity/associated sxs/prior treatment) HPI  22 year old female presents complaining of fever and abdominal pain. Patient reports gradual onset of pain to the right lower quadrant for the past 3 days. Describe pain as a achy sharp sensation worsening with movement, nonradiating, 8/10. She has fever as high as 103 relief with taking ibuprofen. She has endorsed nausea with nonbilious nonbloody vomits. Vomitus usually happens after eating and she has decrease in appetite. She denies having any specific dysuria or hematuria. No report of headache, neck stiffness,  Chest pain, shortness of breath, vaginal discharge, or rash. She was seen last night for complaints, her UA shows evidence of urinary tract infection however patient left prior to receiving treatment because she has to return home to take care of her kids. Her symptoms persist, prompting her to come to the ER for further management. Patient is a G3 P3. Patient also reports last bowel movement was 4 days ago. She usually has bowel movements every other day. She denies constipation and able to pass flatus. Has prior history of cesarean section.  History reviewed. No pertinent past medical history.  Past Surgical History  Procedure Laterality Date  . Cesarean section    . Cesarean section N/A 08/09/2012    Procedure: Repeat CESAREAN SECTION of baby boy  at 0431  APGAR 9/9;  Surgeon: Kathreen Cosier, MD;  Location: [facility];  Service: Obstetrics;  Laterality: N/A;    Family History  Problem Relation Age of Onset  . Other Neg Hx     History  Substance Use Topics  . Smoking status: Never Smoker   . Smokeless tobacco: Never Used  . Alcohol Use: No    OB History   Grav Para Term  Preterm Abortions TAB SAB Ect Mult Living   3 3 3       3       Review of Systems  Constitutional:       10 Systems reviewed and all are negative for acute change except as noted in the HPI.     Allergies  Review of patient's allergies indicates no known allergies.  Home Medications   Current Outpatient Rx  Name  Route  Sig  Dispense  Refill  . ferrous sulfate 300 (60 FE) MG/5ML syrup   Oral   Take 5 mLs (300 mg total) by mouth 2 (two) times daily.   300 mL   5   . ibuprofen (ADVIL,MOTRIN) 100 MG/5ML suspension   Oral   Take 30 mLs (600 mg total) by mouth every 6 (six) hours as needed for fever.   473 mL   5   . oxyCODONE-acetaminophen (ROXICET) 5-325 MG/5ML solution   Oral   Take 5-10 mLs by mouth every 4 (four) hours as needed for pain.   500 mL   0   . Prenatal Vit-Fe Fumarate-FA (MULTIVITAMIN-PRENATAL) 27-0.8 MG TABS   Oral   Take 1 tablet by mouth daily.         Marland Kitchen EXPIRED: promethazine (PHENERGAN) 25 MG tablet   Oral   Take 1 tablet (25 mg total) by mouth every 6 (six) hours as needed for nausea.   30 tablet   0     BP 112/69  Pulse 96  Temp(Src) 99.2  F (37.3 C)  Resp 16  SpO2 99%  Physical Exam  Nursing note and vitals reviewed. Constitutional: She appears well-developed and well-nourished. No distress.  HENT:  Head: Normocephalic and atraumatic.  Eyes: Conjunctivae are normal.  Neck: Normal range of motion. Neck supple.  Cardiovascular: Normal rate and regular rhythm.   Pulmonary/Chest: Effort normal and breath sounds normal. She exhibits no tenderness.  Abdominal: Soft. Bowel sounds are normal. She exhibits no distension. There is tenderness (Right lower quadrant and suprapubic tenderness with positive obturator sign. No Murphy sign.). There is no rebound and no guarding.  Genitourinary: Vagina normal and uterus normal. There is no rash or lesion on the right labia. There is no rash or lesion on the left labia. Cervix exhibits no motion  tenderness and no discharge. Right adnexum displays no mass and no tenderness. Left adnexum displays no mass and no tenderness. No erythema, tenderness or bleeding around the vagina. No vaginal discharge found.  No CVA tenderness  Chaperone present  Lymphadenopathy:       Right: No inguinal adenopathy present.       Left: No inguinal adenopathy present.    ED Course  Procedures (including critical care time)  11:26 AM Patient has evidence of urinary tract infection from urinalysis obtained yesterday. Rocephin given in the ED.  Pelvic exam without evidence suggestive of PID.  No CMT.    1:24 PM Patient's potassium is 2.9 here. Supplementation given. Will continue with IV hydration.  Pregnancy test from yesterday is negative.    2:07 PM Pt felt better, able to tolerates PO.  WIll d/c with Keflex, culture sent.  Recommend K+ supplementation and have K+ recheck by PCP.  Resources given.  Otherwise pt stable for discharge.  Care discussed with attending.    Labs Reviewed  WET PREP, GENITAL - Abnormal; Notable for the following:    Clue Cells Wet Prep HPF POC FEW (*)    WBC, Wet Prep HPF POC MANY (*)    All other components within normal limits  CBC WITH DIFFERENTIAL - Abnormal; Notable for the following:    RBC 3.68 (*)    Hemoglobin 10.5 (*)    HCT 31.2 (*)    Neutrophils Relative % 80 (*)    All other components within normal limits  BASIC METABOLIC PANEL - Abnormal; Notable for the following:    Potassium 2.9 (*)    Glucose, Bld 158 (*)    All other components within normal limits  URINALYSIS, ROUTINE W REFLEX MICROSCOPIC - Abnormal; Notable for the following:    APPearance TURBID (*)    Hgb urine dipstick MODERATE (*)    Bilirubin Urine SMALL (*)    Ketones, ur 15 (*)    Protein, ur 100 (*)    Urobilinogen, UA 2.0 (*)    Leukocytes, UA LARGE (*)    All other components within normal limits  URINE MICROSCOPIC-ADD ON - Abnormal; Notable for the following:    Squamous  Epithelial / LPF FEW (*)    Bacteria, UA MANY (*)    All other components within normal limits  GC/CHLAMYDIA PROBE AMP  URINE CULTURE   No results found.   1. Pyelonephritis       MDM  BP 113/71  Pulse 83  Temp(Src) 99.2 F (37.3 C)  Resp 16  SpO2 100%  I have reviewed nursing notes and vital signs.   I reviewed available ER/hospitalization records thought the EMR         [facility]  Laveda Norman, PA-C 11/19/12 1454

## 2012-11-19 NOTE — ED Provider Notes (Signed)
Patient seen/examined in the Emergency Department in conjunction with Midlevel Provider Laveda Norman Patient reports flank pain, abdominal pain and fever Exam : pt awake/alert.  No distress.  Abdomen soft on my evaluation. +right CVA tenderness Plan: plan is to treat for probable pyelonephritis I doubt acute appendicitis   Colleen Gaskins, MD 11/19/12 1207

## 2012-11-19 NOTE — ED Provider Notes (Signed)
Medical screening examination/treatment/procedure(s) were conducted as a shared visit with non-physician practitioner(s) and myself.  I personally evaluated the patient during the encounter  Pt stable in the ED, no distress On my eval, I do not feel she  Has acute appendicitis   Joya Gaskins, MD 11/19/12 [phone]

## 2012-11-19 NOTE — ED Notes (Signed)
PA at bedside. Colleen Oneill

## 2012-11-19 NOTE — ED Notes (Signed)
NURSE FIRST ROUNDS ; UNABLE TO LOCATE PT. AT TRIAGE SEVERAL TIMES .

## 2012-11-19 NOTE — ED Notes (Signed)
Fever and vomiting was here yesterday but could not stay is in pain

## 2012-11-20 LAB — GC/CHLAMYDIA PROBE AMP
CT Probe RNA: NEGATIVE
GC Probe RNA: NEGATIVE

## 2012-11-20 LAB — URINE CULTURE: Colony Count: >=100000

## 2012-11-21 ENCOUNTER — Telehealth (HOSPITAL_COMMUNITY): Payer: Self-pay | Admitting: Emergency Medicine

## 2012-11-21 LAB — URINE CULTURE

## 2012-11-21 NOTE — ED Notes (Signed)
Post ED Visit - Positive Culture Follow-up  Culture report reviewed by antimicrobial stewardship pharmacist: []  Wes Dulaney, Pharm.D., BCPS []  Celedonio Miyamoto, [address].D., BCPS []  Georgina Pillion, Pharm.D., BCPS []  [place], [address].D., BCPS, AAHIVP [x]  Estella Husk, Pharm.D., BCPS, AAHIVP  Positive urine culture Treated with Keflex, organism sensitive to the same and no further patient follow-up is required at this time.  Colleen Oneill 11/21/2012, 5:04 PM

## 2013-01-27 ENCOUNTER — Inpatient Hospital Stay (HOSPITAL_COMMUNITY)
Admission: AD | Admit: 2013-01-27 | Discharge: 2013-01-28 | Disposition: A | Discharge status: Home / Self Care | Payer: Medicaid Other | Source: Ambulatory Visit | Attending: Obstetrics | Admitting: Obstetrics

## 2013-01-27 ENCOUNTER — Encounter (HOSPITAL_COMMUNITY): Payer: Self-pay | Admitting: *Deleted

## 2013-01-27 DIAGNOSIS — N912 Amenorrhea, unspecified: Secondary | ICD-10-CM | POA: Insufficient documentation

## 2013-01-27 DIAGNOSIS — R112 Nausea with vomiting, unspecified: Secondary | ICD-10-CM | POA: Insufficient documentation

## 2013-01-27 DIAGNOSIS — O219 Vomiting of pregnancy, unspecified: Secondary | ICD-10-CM

## 2013-01-27 DIAGNOSIS — Z3201 Encounter for pregnancy test, result positive: Secondary | ICD-10-CM | POA: Insufficient documentation

## 2013-01-27 LAB — URINALYSIS, ROUTINE W REFLEX MICROSCOPIC
Bilirubin Urine: NEGATIVE
Nitrite: NEGATIVE
Protein, ur: 30 mg/dL — AB
Specific Gravity, Urine: 1.025 (ref 1.005–1.030)
Urobilinogen, UA: 1 mg/dL (ref 0.0–1.0)

## 2013-01-27 LAB — URINE MICROSCOPIC-ADD ON

## 2013-01-27 LAB — POCT PREGNANCY, URINE: Preg Test, Ur: POSITIVE — AB

## 2013-01-27 MED ORDER — MAGNESIUM HYDROXIDE 400 MG/5ML PO SUSP
30.0000 mL | Freq: Once | ORAL | Status: AC
  Administered 2013-01-27: 30 mL via ORAL
  Filled 2013-01-27: qty 30

## 2013-01-27 MED ORDER — ONDANSETRON 8 MG PO TBDP
8.0000 mg | ORAL_TABLET | Freq: Once | ORAL | Status: AC
  Administered 2013-01-27: 8 mg via ORAL
  Filled 2013-01-27: qty 1

## 2013-01-27 NOTE — MAU Note (Signed)
Missed period, pos home test. No bleeding or d/c.  Nausea and vomiting

## 2013-01-27 NOTE — MAU Provider Note (Signed)
Chief Complaint: Abdominal Pain, Possible Pregnancy and Nausea   First Provider Initiated Contact with Patient 01/28/2013 at 2200.  SUBJECTIVE HPI: Colleen Oneill is a 22 y.o. [phone] at [redacted]w[redacted]d by LMP who presents with nausea and vomiting and positive home pregnancy test. Hasn't tried anything for the nausea and vomiting. Tolerating POs. denies fever, chills, diarrhea, sick contacts, vaginal bleeding, vaginal discharge, abdominal pain or urinary complaints.  History reviewed. No pertinent past medical history. OB History   Grav Para Term Preterm Abortions TAB SAB Ect Mult Living   4 3 3       3      # Outc Date GA Lbr Len/2nd Wgt Sex Del Anes PTL Lv   1 TRM 2010    F SVD None  Yes   2 TRM 2011    M LTCS EPI  Yes   3 TRM 2/14 [redacted]w[redacted]d 00:00 3.465kg(7lb10.2oz) M LTCS Spinal  Yes   4 CUR              Past Surgical History  Procedure Laterality Date  . Cesarean section    . Cesarean section N/A 08/09/2012    Procedure: Repeat CESAREAN SECTION of baby boy  at 0431  APGAR 9/9;  Surgeon: Kathreen Cosier, MD;  Location: [facility];  Service: Obstetrics;  Laterality: N/A;   History   Social History  . Marital Status: Single    Spouse Name: N/A    Number of Children: N/A  . Years of Education: N/A   Occupational History  . Not on file.   Social History Main Topics  . Smoking status: Never Smoker   . Smokeless tobacco: Never Used  . Alcohol Use: No  . Drug Use: No  . Sexually Active: Yes    Birth Control/ Protection: None   Other Topics Concern  . Not on file   Social History Narrative  . No narrative on file   No current facility-administered medications on file prior to encounter.   No current outpatient prescriptions on file prior to encounter.   No Known Allergies  ROS: Pertinent items in HPI  OBJECTIVE Blood pressure 100/58, pulse 51, temperature 97.7 F (36.5 C), temperature source Axillary, resp. rate 18, weight 59.421 kg (131 lb), last menstrual period  12/09/2012. GENERAL: Well-developed, well-nourished female in no acute distress.  HEENT: Normocephalic, mucous membranes moist.  HEART: normal rate RESP: normal effort ABDOMEN: Soft, non-tender EXTREMITIES: Nontender, no edema NEURO: Alert and oriented SPECULUM EXAM: Deferred  LAB RESULTS Results for orders placed during the hospital encounter of 01/27/13 (from the past 24 hour(s))  URINALYSIS, ROUTINE W REFLEX MICROSCOPIC     Status: Abnormal   Collection Time    01/27/13  5:20 PM      Result Value Range   Color, Urine YELLOW  YELLOW   APPearance HAZY (*) CLEAR   Specific Gravity, Urine 1.025  1.005 - 1.030   pH 6.5  5.0 - 8.0   Glucose, UA NEGATIVE  NEGATIVE mg/dL   Hgb urine dipstick SMALL (*) NEGATIVE   Bilirubin Urine NEGATIVE  NEGATIVE   Ketones, ur 40 (*) NEGATIVE mg/dL   Protein, ur 30 (*) NEGATIVE mg/dL   Urobilinogen, UA 1.0  0.0 - 1.0 mg/dL   Nitrite NEGATIVE  NEGATIVE   Leukocytes, UA MODERATE (*) NEGATIVE  URINE MICROSCOPIC-ADD ON     Status: Abnormal   Collection Time    01/27/13  5:20 PM      Result Value Range   Squamous Epithelial /  LPF MANY (*) RARE   WBC, UA 21-50  <3 WBC/hpf   Bacteria, UA FEW (*) RARE   Urine-Other MUCOUS PRESENT    POCT PREGNANCY, URINE     Status: Abnormal   Collection Time    01/27/13  5:32 PM      Result Value Range   Preg Test, Ur POSITIVE (*) NEGATIVE    IMAGING No results found.  MAU COURSE 0050: Vomited x 1 since Zofran. Nausea continues. Thinks she can keep down pill. Phenergan PO ordered.   Feeling better after Phenergan. Keeping down fluids.  ASSESSMENT 1. Nausea and vomiting in pregnancy prior to [redacted] weeks gestation     PLAN Discharge home in stable condition. Advance diet slowly. Urine culture pending. (Possible contaminated urine specimen.)     Follow-up Information   Follow up with [facility].      Follow up with THE [facility] OF [place] MATERNITY ADMISSIONS. (As needed if symptoms  worsen)    Contact information:   [address] [id number] [place] [address] [phone]       Medication List    STOP taking these medications       cephALEXin 500 MG capsule  Commonly known as:  KEFLEX     ibuprofen 200 MG tablet  Commonly known as:  ADVIL,MOTRIN     ondansetron 4 MG tablet  Commonly known as:  ZOFRAN      TAKE these medications       prenatal multivitamin Tabs tablet  Take 1 tablet by mouth daily at 12 noon.     promethazine 25 MG suppository  Commonly known as:  PHENERGAN  Place 1 suppository (25 mg total) rectally every 6 (six) hours as needed for nausea.     promethazine 25 MG tablet  Commonly known as:  PHENERGAN  Take 1 tablet (25 mg total) by mouth every 6 (six) hours as needed for nausea.       [place], CNM 01/28/2013  1:30 AM

## 2013-01-28 MED ORDER — PROMETHAZINE HCL 25 MG RE SUPP: 25.0000 mg | Freq: Four times a day (QID) | RECTAL | Status: DC | PRN

## 2013-01-28 MED ORDER — PROMETHAZINE HCL 25 MG PO TABS: 25.0000 mg | ORAL_TABLET | Freq: Four times a day (QID) | ORAL | Status: DC | PRN

## 2013-01-28 MED ORDER — PROMETHAZINE HCL 25 MG PO TABS
25.0000 mg | ORAL_TABLET | Freq: Once | ORAL | Status: AC
  Administered 2013-01-28: 25 mg via ORAL
  Filled 2013-01-28: qty 1

## 2013-01-29 ENCOUNTER — Inpatient Hospital Stay (HOSPITAL_COMMUNITY): Payer: Medicaid Other

## 2013-01-29 ENCOUNTER — Encounter (HOSPITAL_COMMUNITY): Payer: Self-pay | Admitting: *Deleted

## 2013-01-29 ENCOUNTER — Inpatient Hospital Stay (HOSPITAL_COMMUNITY)
Admission: AD | Admit: 2013-01-29 | Discharge: 2013-01-29 | Disposition: A | Discharge status: Home / Self Care | Payer: Medicaid Other | Source: Ambulatory Visit | Attending: Obstetrics & Gynecology | Admitting: Obstetrics & Gynecology

## 2013-01-29 DIAGNOSIS — O209 Hemorrhage in early pregnancy, unspecified: Secondary | ICD-10-CM | POA: Insufficient documentation

## 2013-01-29 DIAGNOSIS — N390 Urinary tract infection, site not specified: Secondary | ICD-10-CM

## 2013-01-29 DIAGNOSIS — O2390 Unspecified genitourinary tract infection in pregnancy, unspecified trimester: Secondary | ICD-10-CM | POA: Insufficient documentation

## 2013-01-29 LAB — CBC
HCT: 32.1 % — ABNORMAL LOW (ref 36.0–46.0)
Hemoglobin: 11.1 g/dL — ABNORMAL LOW (ref 12.0–15.0)
RDW: 14.9 % (ref 11.5–15.5)
WBC: 6.1 10*3/uL (ref 4.0–10.5)

## 2013-01-29 LAB — URINALYSIS, ROUTINE W REFLEX MICROSCOPIC
Glucose, UA: NEGATIVE mg/dL
Specific Gravity, Urine: 1.02 (ref 1.005–1.030)

## 2013-01-29 LAB — URINE MICROSCOPIC-ADD ON

## 2013-01-29 LAB — HCG, QUANTITATIVE, PREGNANCY: hCG, Beta Chain, Quant, S: 35483 m[IU]/mL — ABNORMAL HIGH (ref <5)

## 2013-01-29 MED ORDER — CEPHALEXIN 500 MG PO CAPS: 500.0000 mg | ORAL_CAPSULE | Freq: Four times a day (QID) | ORAL | Status: DC

## 2013-01-29 NOTE — MAU Note (Signed)
Pt presents with complaints of vaginal bleeding since last night at 930. Denies any pain.

## 2013-01-29 NOTE — MAU Note (Signed)
Pt. Here due to bleeding that started last night around 2130. Denies pain. Last intercourse was in June. Denies any strenuous activities before bleeding occurred. Did have bleeding with second pregnancy.

## 2013-01-29 NOTE — MAU Provider Note (Signed)
Attestation of Attending Supervision of Advanced Practitioner (CNM/NP): Evaluation and management procedures were performed by the Advanced Practitioner under my supervision and collaboration.  I have reviewed the Advanced Practitioner's note and chart, and I agree with the management and plan.  HARRAWAY-SMITH, Evren Shankland 2:10 PM     

## 2013-01-29 NOTE — MAU Provider Note (Signed)
History     CSN: [id number]  Arrival date and time: 01/29/13 1113   First Provider Initiated Contact with Patient 01/29/13 1152      Chief Complaint  Patient presents with  . Vaginal Bleeding   HPI Colleen Oneill 22 y.o. [redacted]w[redacted]d  Comes to MAU with vaginal bleeding since 9:30 last night.  No abdominal pain.  Has been on Keflex for a UTI previously in this pregnancy.  OB History   Grav Para Term Preterm Abortions TAB SAB Ect Mult Living   4 3 3       3       History reviewed. No pertinent past medical history.  Past Surgical History  Procedure Laterality Date  . Cesarean section    . Cesarean section N/A 08/09/2012    Procedure: Repeat CESAREAN SECTION of baby boy  at 0431  APGAR 9/9;  Surgeon: Kathreen Cosier, MD;  Location: [facility];  Service: Obstetrics;  Laterality: N/A;    Family History  Problem Relation Age of Onset  . Other Neg Hx     History  Substance Use Topics  . Smoking status: Never Smoker   . Smokeless tobacco: Never Used  . Alcohol Use: No    Allergies: No Known Allergies  Prescriptions prior to admission  Medication Sig Dispense Refill  . Prenatal Vit-Fe Fumarate-FA (PRENATAL MULTIVITAMIN) TABS tablet Take 1 tablet by mouth daily at 12 noon.      . promethazine (PHENERGAN) 25 MG suppository Place 1 suppository (25 mg total) rectally every 6 (six) hours as needed for nausea.  12 each  1  . promethazine (PHENERGAN) 25 MG tablet Take 1 tablet (25 mg total) by mouth every 6 (six) hours as needed for nausea.  30 tablet  1    Review of Systems  Constitutional: Negative for fever.  Gastrointestinal: Negative for nausea, vomiting and abdominal pain.  Genitourinary:       No vaginal discharge. Vaginal bleeding. No dysuria.   Physical Exam   Blood pressure 119/74, pulse 66, temperature 97.9 F (36.6 C), temperature source Oral, resp. rate 18, last menstrual period 12/09/2012.  Physical Exam  Nursing note and vitals reviewed. Constitutional: She is  oriented to person, place, and time. She appears well-developed and well-nourished. No distress.  HENT:  Head: Normocephalic.  Eyes: EOM are normal.  Neck: Neck supple.  GI: Soft. There is no tenderness.  Musculoskeletal: Normal range of motion.  Neurological: She is alert and oriented to person, place, and time.  Skin: Skin is warm and dry.  Psychiatric: She has a normal mood and affect.    MAU Course  Procedures  MDM Clinical Data: 22 year old pregnant female with vaginal bleeding.  Estimated gestational age of [redacted] weeks 2 days by LMP.  OBSTETRIC <14 WK [place] AND TRANSVAGINAL OB US  Technique: Both transabdominal and transvaginal ultrasound  examinations were performed for complete evaluation of the  gestation as well as the maternal uterus, adnexal regions, and  pelvic cul-de-sac. Transvaginal technique was performed to assess  early pregnancy.  Comparison: None.  Intrauterine gestational sac: Visualized and normal in shape  Yolk sac: Visualized  Embryo: Visualized  Cardiac Activity: Visualized  Heart Rate: 131 bpm  CRL: 8 mm 6 w 6 d [place] EDC: 09/18/2013  Maternal uterus/adnexae:  A moderate to large subchorionic hemorrhage is noted.  The ovaries bilaterally are unremarkable.  There is no evidence of free fluid or adnexal mass.  IMPRESSION:  Single living intrauterine gestation with estimated gestational age  of [redacted] weeks 6 days by this ultrasound.  Moderate to large subchorionic hemorrhage.   Blood type O positive   Assessment and Plan  Bleeding in pregnancy  - Subchorionic hemorrhage UTI  Plan Urine culture pending. Keflex 500 mg PO QID x 7 days (#28) no refills - capsules ordered as client cannot swallow pills and will open capsules to put on food to swallow. Begin prenatal care as soon as possible Expect to have vaginal bleeding Pelvic rest.    Colleen Oneill 01/29/2013, 12:01 PM

## 2013-01-30 LAB — URINE CULTURE

## 2013-01-31 LAB — URINE CULTURE

## 2013-02-01 ENCOUNTER — Encounter (HOSPITAL_COMMUNITY): Payer: Self-pay | Admitting: *Deleted

## 2013-02-01 ENCOUNTER — Inpatient Hospital Stay (HOSPITAL_COMMUNITY)
Admission: AD | Admit: 2013-02-01 | Discharge: 2013-02-02 | Disposition: A | Discharge status: Home / Self Care | Payer: Medicaid Other | Source: Ambulatory Visit | Attending: Obstetrics & Gynecology | Admitting: Obstetrics & Gynecology

## 2013-02-01 ENCOUNTER — Other Ambulatory Visit: Payer: Self-pay | Admitting: Medical

## 2013-02-01 DIAGNOSIS — N390 Urinary tract infection, site not specified: Secondary | ICD-10-CM | POA: Insufficient documentation

## 2013-02-01 DIAGNOSIS — O219 Vomiting of pregnancy, unspecified: Secondary | ICD-10-CM

## 2013-02-01 DIAGNOSIS — O210 Mild hyperemesis gravidarum: Secondary | ICD-10-CM | POA: Insufficient documentation

## 2013-02-01 DIAGNOSIS — O2390 Unspecified genitourinary tract infection in pregnancy, unspecified trimester: Secondary | ICD-10-CM | POA: Insufficient documentation

## 2013-02-01 DIAGNOSIS — O2341 Unspecified infection of urinary tract in pregnancy, first trimester: Secondary | ICD-10-CM

## 2013-02-01 LAB — URINALYSIS, ROUTINE W REFLEX MICROSCOPIC
Glucose, UA: NEGATIVE mg/dL
Nitrite: NEGATIVE
Protein, ur: 100 mg/dL — AB

## 2013-02-01 LAB — URINE MICROSCOPIC-ADD ON

## 2013-02-01 MED ORDER — METOCLOPRAMIDE HCL 5 MG/ML IJ SOLN
10.0000 mg | Freq: Once | INTRAMUSCULAR | Status: AC
  Administered 2013-02-01: 10 mg via INTRAVENOUS
  Filled 2013-02-01: qty 2

## 2013-02-01 MED ORDER — SODIUM CHLORIDE 0.9 % IV BOLUS (SEPSIS)
1000.0000 mL | Freq: Once | INTRAVENOUS | Status: AC
  Administered 2013-02-02: 1000 mL via INTRAVENOUS

## 2013-02-01 MED ORDER — DEXTROSE 5 % IV SOLN
1.0000 g | Freq: Once | INTRAVENOUS | Status: AC
Start: 2013-02-01 — End: 2013-02-02
  Administered 2013-02-02: 1 g via INTRAVENOUS
  Filled 2013-02-01: qty 10

## 2013-02-01 MED ORDER — DEXTROSE 5 % IN LACTATED RINGERS IV BOLUS
1000.0000 mL | Freq: Once | INTRAVENOUS | Status: AC
  Administered 2013-02-01: 1000 mL via INTRAVENOUS

## 2013-02-01 MED ORDER — ONDANSETRON 8 MG/NS 50 ML IVPB
8.0000 mg | Freq: Once | INTRAVENOUS | Status: AC
  Administered 2013-02-01: 8 mg via INTRAVENOUS
  Filled 2013-02-01: qty 8

## 2013-02-01 MED ORDER — CEPHALEXIN 500 MG PO CAPS: 500.0000 mg | ORAL_CAPSULE | Freq: Four times a day (QID) | ORAL | Status: DC

## 2013-02-01 NOTE — Progress Notes (Signed)
Patient called MAU stating that medication previously prescribed was too expensive. Noted that Dr. Clearance Coots had his office call in [facility] which is very expensive, however the Keflex recommended by Nolene Bernheim, NP after patient's last MAU visit is on the $4 list. I have confirmed this with the patient's pharmacy ([facility] on [address] Rx was resent and patient informed to pick up Rx today.   Freddi Starr, PA-C 02/01/2013 9:39 AM

## 2013-02-01 NOTE — MAU Note (Signed)
PT SAYS SHE WAS HERE ON 8-7 AND 8-9-   FOR NAUSEA-   GAVE HER PHENERGAN-     TOOK LAST AT 8 AM - AND VOMIT ED PILL.    HAS VOMITED  X7 TODAY.      GAVE HER ZOFRAN HERE-  HELPED.     LAST ATE TACOS  YESTERDAY  -  NO FOOD TODAY.  DRANK WATER  TODAY  AT 3PM- VOMITED.Marland Kitchen

## 2013-02-01 NOTE — MAU Note (Addendum)
Pt states she started having nausea and vomiting last week. Pt states she was seen her and was given phenergan. Pt feel her nausea and vomiting 'got worse"

## 2013-02-01 NOTE — MAU Provider Note (Signed)
Chief Complaint: Morning Sickness   First Provider Initiated Contact with Patient 02/01/13 2132     SUBJECTIVE HPI: Colleen Oneill is a 22 y.o. [phone] at [redacted]w[redacted]d by LMP who presents with worsening of nausea vomiting of pregnancy. Was prescribed Phenergan last week. Nausea vomiting worse rather than better. Denies fever, chills, diarrhea, constipation, sick contacts, vaginal bleeding, vaginal discharge, abdominal pain.    History reviewed. No pertinent past medical history. OB History   Grav Para Term Preterm Abortions TAB SAB Ect Mult Living   4 3 3       3      # Outc Date GA Lbr Len/2nd Wgt Sex Del Anes PTL Lv   1 TRM 2010    F SVD None  Yes   2 TRM 2011    M LTCS EPI  Yes   3 TRM 2/14 100w1d 00:00 3.465kg(7lb10.2oz) M LTCS Spinal  Yes   4 CUR              Past Surgical History  Procedure Laterality Date  . Cesarean section    . Cesarean section N/A 08/09/2012    Procedure: Repeat CESAREAN SECTION of baby boy  at 0431  APGAR 9/9;  Surgeon: Kathreen Cosier, MD;  Location: [facility];  Service: Obstetrics;  Laterality: N/A;   History   Social History  . Marital Status: Single    Spouse Name: N/A    Number of Children: N/A  . Years of Education: N/A   Occupational History  . Not on file.   Social History Main Topics  . Smoking status: Never Smoker   . Smokeless tobacco: Never Used  . Alcohol Use: No  . Drug Use: No  . Sexually Active: Yes    Birth Control/ Protection: None   Other Topics Concern  . Not on file   Social History Narrative  . No narrative on file   No current facility-administered medications on file prior to encounter.   Current Outpatient Prescriptions on File Prior to Encounter  Medication Sig Dispense Refill  . Prenatal Vit-Fe Fumarate-FA (PRENATAL MULTIVITAMIN) TABS tablet Take 1 tablet by mouth daily at 12 noon.      . promethazine (PHENERGAN) 25 MG tablet Take 1 tablet (25 mg total) by mouth every 6 (six) hours as needed for nausea.  30 tablet  1    No Known Allergies  ROS: Pertinent items in HPI  OBJECTIVE Blood pressure 123/84, pulse 94, temperature 98 F (36.7 C), temperature source Oral, resp. rate 20, height 5\' 8"  (1.727 m), weight 57.267 kg (126 lb 4 oz), last menstrual period 12/09/2012. GENERAL: Well-developed, well-nourished female in no acute distress. Lethargic appearing. HEENT: Normocephalic, if his membranes dry. HEART: normal rate RESP: normal effort ABDOMEN: Soft, non-tender, no CVA tenderness. EXTREMITIES: Nontender, no edema NEURO: Alert and oriented SPECULUM EXAM: Deferred  LAB RESULTS Results for orders placed during the hospital encounter of 02/01/13 (from the past 24 hour(s))  URINALYSIS, ROUTINE W REFLEX MICROSCOPIC     Status: Abnormal   Collection Time    02/01/13  8:26 PM      Result Value Range   Color, Urine YELLOW  YELLOW   APPearance CLEAR  CLEAR   Specific Gravity, Urine >1.030 (*) 1.005 - 1.030   pH 6.0  5.0 - 8.0   Glucose, UA NEGATIVE  NEGATIVE mg/dL   Hgb urine dipstick SMALL (*) NEGATIVE   Bilirubin Urine SMALL (*) NEGATIVE   Ketones, ur 15 (*) NEGATIVE mg/dL  Protein, ur 100 (*) NEGATIVE mg/dL   Urobilinogen, UA 0.2  0.0 - 1.0 mg/dL   Nitrite NEGATIVE  NEGATIVE   Leukocytes, UA MODERATE (*) NEGATIVE  URINE MICROSCOPIC-ADD ON     Status: Abnormal   Collection Time    02/01/13  8:26 PM      Result Value Range   Squamous Epithelial / LPF FEW (*) RARE   WBC, UA TOO NUMEROUS TO COUNT  <3 WBC/hpf   RBC / HPF 21-50  <3 RBC/hpf   Bacteria, UA MANY (*) RARE    IMAGING N/A  MAU COURSE Zofran and D5 LR given.  Minimal improvement in nausea with Zofran. No further vomiting. Unable to tolerate POs. Reglan and second liter fluid ordered. Rocephin ordered due to patient not being able to start antibiotics for UTI.  Feeling better and tolerating liquids by mouth.  ASSESSMENT Nausea and vomiting of pregnancy. UTI in pregnancy.  PLAN Discharge home in stable condition. Advance  diet slowly.   Follow-up Information   Follow up with [facility].      Follow up with THE [facility] OF [place] MATERNITY ADMISSIONS. (As needed if symptoms worsen)    Contact information:   [address] [id number] [place] [address] [phone]       Medication List         cephALEXin 500 MG capsule  Commonly known as:  KEFLEX  Take 1 capsule (500 mg total) by mouth 4 (four) times daily.     metoCLOPramide 10 MG tablet  Commonly known as:  REGLAN  Take 1 tablet (10 mg total) by mouth 3 (three) times daily with meals.     prenatal multivitamin Tabs tablet  Take 1 tablet by mouth daily at 12 noon.     promethazine 25 MG tablet  Commonly known as:  PHENERGAN  Take 1 tablet (25 mg total) by mouth every 6 (six) hours as needed for nausea.       [place], [place] 02/01/2013  9:31 PM

## 2013-02-02 DIAGNOSIS — O219 Vomiting of pregnancy, unspecified: Secondary | ICD-10-CM

## 2013-02-03 LAB — URINE CULTURE

## 2013-02-03 MED ORDER — METOCLOPRAMIDE HCL 10 MG PO TABS: 10.0000 mg | ORAL_TABLET | Freq: Three times a day (TID) | ORAL | Status: DC

## 2013-02-06 NOTE — MAU Provider Note (Signed)
Attestation of Attending Supervision of Advanced Practitioner (CNM/NP): Evaluation and management procedures were performed by the Advanced Practitioner under my supervision and collaboration. I have reviewed the Advanced Practitioner's note and chart, and I agree with the management and plan.  LEGGETT,KELLY H. 4:32 PM

## 2013-03-08 ENCOUNTER — Encounter: Payer: Self-pay | Admitting: Obstetrics

## 2013-03-14 ENCOUNTER — Encounter: Payer: Self-pay | Admitting: Obstetrics

## 2013-03-14 ENCOUNTER — Ambulatory Visit (INDEPENDENT_AMBULATORY_CARE_PROVIDER_SITE_OTHER): Payer: Medicaid Other | Admitting: Obstetrics

## 2013-03-14 VITALS — BP 116/72 | Temp 98.9°F | Wt 134.0 lb

## 2013-03-14 DIAGNOSIS — O219 Vomiting of pregnancy, unspecified: Secondary | ICD-10-CM

## 2013-03-14 DIAGNOSIS — Z3480 Encounter for supervision of other normal pregnancy, unspecified trimester: Secondary | ICD-10-CM

## 2013-03-14 DIAGNOSIS — O210 Mild hyperemesis gravidarum: Secondary | ICD-10-CM

## 2013-03-14 DIAGNOSIS — Z3481 Encounter for supervision of other normal pregnancy, first trimester: Secondary | ICD-10-CM

## 2013-03-14 MED ORDER — ONDANSETRON 8 MG PO TBDP: 8.0000 mg | ORAL_TABLET | Freq: Three times a day (TID) | ORAL | Status: DC | PRN

## 2013-03-14 NOTE — Progress Notes (Signed)
P 64 Patient had early bleeding that seems to have subsided. Subjective:    Colleen Oneill is being seen today for her first obstetrical visit.  This is a planned pregnancy. She is at [redacted]w[redacted]d gestation. Her obstetrical history is significant for none. Relationship with FOB: significant other, living together. Patient does intend to breast feed. Pregnancy history fully reviewed.  Menstrual History: OB History   Grav Para Term Preterm Abortions TAB SAB Ect Mult Living   4 3 3       3       Menarche age: 86  Patient's last menstrual period was 12/09/2012.    The following portions of the patient's history were reviewed and updated as appropriate: allergies, current medications, past family history, past medical history, past social history, past surgical history and problem list.  Review of Systems Pertinent items are noted in HPI.    Objective:    General appearance: alert and no distress Abdomen: normal findings: soft, non-tender Pelvic: cervix normal in appearance, external genitalia normal, no adnexal masses or tenderness, no cervical motion tenderness, vagina normal without discharge and uterus ~ 13 weeks size, soft, NT. Extremities: extremities normal, atraumatic, no cyanosis or edema    Assessment:    Pregnancy at [redacted]w[redacted]d weeks    Plan:    Initial labs drawn. Prenatal vitamins.  Counseling provided regarding continued use of seat belts, cessation of alcohol consumption, smoking or use of illicit drugs; infection precautions i.e., influenza/TDAP immunizations, toxoplasmosis,CMV, parvovirus, listeria and varicella; workplace safety, exercise during pregnancy; routine dental care, safe medications, sexual activity, hot tubs, saunas, pools, travel, caffeine use, fish and methlymercury, potential toxins, hair treatments, varicose veins Weight gain recommendations reviewed: underweight/BMI< 18.5--> gain 28 - 40 lbs; normal weight/BMI 18.5 - 24.9--> gain 25 - 35 lbs; overweight/BMI 25 -  29.9--> gain 15 - 25 lbs; obese/BMI >30->gain  11 - 20 lbs Problem list reviewed and updated. AFP3 discussed: requested. Role of ultrasound in pregnancy discussed; fetal survey: requested. Amniocentesis discussed: not indicated. Follow up in 2 weeks. 50% of 20 min visit spent on counseling and coordination of care.

## 2013-03-15 ENCOUNTER — Encounter: Payer: Self-pay | Admitting: Obstetrics

## 2013-03-15 DIAGNOSIS — O219 Vomiting of pregnancy, unspecified: Secondary | ICD-10-CM | POA: Insufficient documentation

## 2013-03-15 LAB — OBSTETRIC PANEL
Basophils Absolute: 0 10*3/uL (ref 0.0–0.1)
Eosinophils Relative: 1 % (ref 0–5)
Hepatitis B Surface Ag: NEGATIVE
Lymphocytes Relative: 24 % (ref 12–46)
Lymphs Abs: 1.9 10*3/uL (ref 0.7–4.0)
Neutrophils Relative %: 69 % (ref 43–77)
Platelets: 307 10*3/uL (ref 150–400)
RBC: 3.85 MIL/uL — ABNORMAL LOW (ref 3.87–5.11)
RDW: 15.2 % (ref 11.5–15.5)
Rubella: 1.05 Index — ABNORMAL HIGH (ref <0.90)
WBC: 7.9 10*3/uL (ref 4.0–10.5)

## 2013-03-15 LAB — PAP IG W/ RFLX HPV ASCU

## 2013-03-15 LAB — WET PREP BY MOLECULAR PROBE
Gardnerella vaginalis: POSITIVE — AB
Trichomonas vaginosis: NEGATIVE

## 2013-03-15 LAB — HIV ANTIBODY (ROUTINE TESTING W REFLEX): HIV: NONREACTIVE

## 2013-03-15 LAB — GC/CHLAMYDIA PROBE AMP
CT Probe RNA: NEGATIVE
GC Probe RNA: NEGATIVE

## 2013-03-15 LAB — CULTURE, OB URINE
Colony Count: NO GROWTH
Organism ID, Bacteria: NO GROWTH

## 2013-03-16 LAB — HEMOGLOBINOPATHY EVALUATION
Hemoglobin Other: 0 %
Hgb A: 97.4 % (ref 96.8–97.8)
Hgb F Quant: 0 % (ref 0.0–2.0)
Hgb S Quant: 0 %

## 2013-03-18 ENCOUNTER — Telehealth: Payer: Self-pay | Admitting: *Deleted

## 2013-03-18 MED ORDER — VITAMIN D 1000 UNITS PO TABS: 1000.0000 [IU] | ORAL_TABLET | Freq: Every day | ORAL | Status: DC

## 2013-03-18 MED ORDER — METRONIDAZOLE 500 MG PO TABS: 500.0000 mg | ORAL_TABLET | Freq: Two times a day (BID) | ORAL | Status: DC

## 2013-03-18 NOTE — Telephone Encounter (Signed)
Message copied by Glendell Docker on Fri Mar 18, 2013 12:18 PM ------      Message from: Coral Ceo A      Created: Wed Mar 16, 2013  1:15 PM       Vitamin D 1000mg  po daily.      Flagyl 500mg  po bid x 7 days. ------

## 2013-03-18 NOTE — Telephone Encounter (Signed)
Call placed to patient, patients mother Olegario Messier stated patient was not available. Message was left with patients mother to have patient return phone call regarding lab results. Rx for flagyl sent to pharmacy. Vitamin D 1000 updated in patients chart

## 2013-03-20 NOTE — Telephone Encounter (Signed)
Attempted to contact patient, voice recording reached stating patient has a voice mailbox that has not been set up. Unable to leave message.

## 2013-03-22 NOTE — Telephone Encounter (Signed)
Patient notified of results- Rx at pharmacy.

## 2013-03-29 ENCOUNTER — Encounter: Payer: Self-pay | Admitting: Obstetrics

## 2013-03-29 ENCOUNTER — Ambulatory Visit (INDEPENDENT_AMBULATORY_CARE_PROVIDER_SITE_OTHER): Payer: Medicaid Other | Admitting: Obstetrics

## 2013-03-29 VITALS — BP 110/77 | Temp 98.2°F | Wt 137.0 lb

## 2013-03-29 DIAGNOSIS — Z369 Encounter for antenatal screening, unspecified: Secondary | ICD-10-CM

## 2013-03-29 DIAGNOSIS — Z3482 Encounter for supervision of other normal pregnancy, second trimester: Secondary | ICD-10-CM

## 2013-03-29 DIAGNOSIS — Z3480 Encounter for supervision of other normal pregnancy, unspecified trimester: Secondary | ICD-10-CM

## 2013-03-29 LAB — POCT URINALYSIS DIPSTICK
Bilirubin, UA: NEGATIVE
Blood, UA: NEGATIVE
Glucose, UA: NEGATIVE
Nitrite, UA: NEGATIVE
Spec Grav, UA: 1.02
pH, UA: 5

## 2013-03-29 NOTE — Progress Notes (Signed)
Pulse: 86

## 2013-03-30 LAB — AFP, QUAD SCREEN
Age Alone: 1:1150 {titer}
Down Syndrome Scr Risk Est: 1:3890 {titer}
HCG, Total: 30875 m[IU]/mL
INH: 399.1 pg/mL
MoM for INH: 2.03
MoM for hCG: 1.21
Open Spina bifida: NEGATIVE
Tri 18 Scr Risk Est: NEGATIVE
Trisomy 18 (Edward) Syndrome Interp.: <1:145000 {titer}

## 2013-04-22 ENCOUNTER — Telehealth: Payer: Self-pay | Admitting: *Deleted

## 2013-04-27 ENCOUNTER — Encounter: Payer: Self-pay | Admitting: Obstetrics

## 2013-04-27 ENCOUNTER — Ambulatory Visit (INDEPENDENT_AMBULATORY_CARE_PROVIDER_SITE_OTHER): Payer: Medicaid Other | Admitting: Obstetrics

## 2013-04-27 VITALS — BP 122/79 | Temp 98.4°F | Wt 144.0 lb

## 2013-04-27 DIAGNOSIS — Z3480 Encounter for supervision of other normal pregnancy, unspecified trimester: Secondary | ICD-10-CM

## 2013-04-27 DIAGNOSIS — Z369 Encounter for antenatal screening, unspecified: Secondary | ICD-10-CM

## 2013-04-27 DIAGNOSIS — Z3482 Encounter for supervision of other normal pregnancy, second trimester: Secondary | ICD-10-CM

## 2013-04-27 LAB — POCT URINALYSIS DIPSTICK
Leukocytes, UA: NEGATIVE
Nitrite, UA: NEGATIVE
Protein, UA: NEGATIVE
Urobilinogen, UA: NEGATIVE
pH, UA: 7

## 2013-04-27 NOTE — Progress Notes (Signed)
HR - 90 PT in office for routine OB visit, denies concerns at this time

## 2013-04-27 NOTE — Telephone Encounter (Signed)
error 

## 2013-04-27 NOTE — Addendum Note (Signed)
Addended by: George Hugh on: 04/27/2013 05:31 PM   Modules accepted: Orders

## 2013-04-27 NOTE — Addendum Note (Signed)
Addended by: Coral Ceo A on: 04/27/2013 02:17 PM   Modules accepted: Orders

## 2013-05-04 ENCOUNTER — Ambulatory Visit (INDEPENDENT_AMBULATORY_CARE_PROVIDER_SITE_OTHER): Payer: Medicaid Other

## 2013-05-04 ENCOUNTER — Ambulatory Visit (INDEPENDENT_AMBULATORY_CARE_PROVIDER_SITE_OTHER): Payer: Medicaid Other | Admitting: Obstetrics

## 2013-05-04 ENCOUNTER — Encounter: Payer: Medicaid Other | Admitting: Obstetrics

## 2013-05-04 ENCOUNTER — Encounter: Payer: Self-pay | Admitting: Obstetrics & Gynecology

## 2013-05-04 VITALS — BP 121/79 | Temp 98.1°F | Wt 145.8 lb

## 2013-05-04 DIAGNOSIS — Z369 Encounter for antenatal screening, unspecified: Secondary | ICD-10-CM

## 2013-05-04 DIAGNOSIS — Z1389 Encounter for screening for other disorder: Secondary | ICD-10-CM

## 2013-05-04 DIAGNOSIS — Z3480 Encounter for supervision of other normal pregnancy, unspecified trimester: Secondary | ICD-10-CM

## 2013-05-04 LAB — POCT URINALYSIS DIPSTICK
Bilirubin, UA: NEGATIVE
Blood, UA: NEGATIVE
Nitrite, UA: NEGATIVE
Protein, UA: NEGATIVE
pH, UA: 7

## 2013-05-04 LAB — US OB DETAIL + 14 WK

## 2013-05-04 NOTE — Progress Notes (Signed)
Pulse: 93

## 2013-05-05 ENCOUNTER — Encounter: Payer: Self-pay | Admitting: Obstetrics

## 2013-05-05 LAB — US OB DETAIL + 14 WK

## 2013-05-06 ENCOUNTER — Encounter: Payer: Medicaid Other | Admitting: Obstetrics

## 2013-05-09 ENCOUNTER — Encounter: Payer: Medicaid Other | Admitting: Obstetrics

## 2013-05-10 ENCOUNTER — Other Ambulatory Visit: Payer: Medicaid Other

## 2013-05-11 ENCOUNTER — Ambulatory Visit (INDEPENDENT_AMBULATORY_CARE_PROVIDER_SITE_OTHER): Payer: Medicaid Other | Admitting: Obstetrics

## 2013-05-11 DIAGNOSIS — Z3480 Encounter for supervision of other normal pregnancy, unspecified trimester: Secondary | ICD-10-CM

## 2013-05-14 ENCOUNTER — Encounter: Payer: Self-pay | Admitting: Obstetrics

## 2013-05-23 ENCOUNTER — Encounter: Payer: Self-pay | Admitting: Obstetrics

## 2013-05-23 LAB — US OB DETAIL + 14 WK

## 2013-05-30 ENCOUNTER — Encounter: Payer: Self-pay | Admitting: Obstetrics

## 2013-05-30 ENCOUNTER — Ambulatory Visit (INDEPENDENT_AMBULATORY_CARE_PROVIDER_SITE_OTHER): Payer: Medicaid Other | Admitting: Obstetrics

## 2013-05-30 VITALS — BP 129/81 | Temp 97.4°F | Wt 151.0 lb

## 2013-05-30 DIAGNOSIS — Z3480 Encounter for supervision of other normal pregnancy, unspecified trimester: Secondary | ICD-10-CM

## 2013-05-30 DIAGNOSIS — Z3482 Encounter for supervision of other normal pregnancy, second trimester: Secondary | ICD-10-CM

## 2013-05-30 LAB — POCT URINALYSIS DIPSTICK
Bilirubin, UA: NEGATIVE
Blood, UA: NEGATIVE
Glucose, UA: NEGATIVE
Nitrite, UA: NEGATIVE
Spec Grav, UA: 1.015

## 2013-05-30 NOTE — Progress Notes (Signed)
Pulse- 96 

## 2013-06-13 ENCOUNTER — Other Ambulatory Visit: Payer: Medicaid Other

## 2013-06-13 ENCOUNTER — Encounter: Payer: Medicaid Other | Admitting: Obstetrics

## 2013-07-12 ENCOUNTER — Encounter: Payer: Medicaid Other | Admitting: Obstetrics

## 2013-07-20 ENCOUNTER — Ambulatory Visit (INDEPENDENT_AMBULATORY_CARE_PROVIDER_SITE_OTHER): Payer: Medicaid Other | Admitting: Obstetrics

## 2013-07-20 ENCOUNTER — Encounter: Payer: Self-pay | Admitting: Obstetrics

## 2013-07-20 VITALS — BP 127/44 | Wt 156.5 lb

## 2013-07-20 DIAGNOSIS — Z3480 Encounter for supervision of other normal pregnancy, unspecified trimester: Secondary | ICD-10-CM

## 2013-07-20 LAB — POCT URINALYSIS DIPSTICK
BILIRUBIN UA: NEGATIVE
Glucose, UA: NEGATIVE
Ketones, UA: NEGATIVE
Nitrite, UA: NEGATIVE
PH UA: 6
PROTEIN UA: NEGATIVE
RBC UA: NEGATIVE
Spec Grav, UA: 1.015
Urobilinogen, UA: NEGATIVE

## 2013-07-20 NOTE — Progress Notes (Signed)
Pulse-  Routine Ob visit visit. No concerns at this time.

## 2013-07-28 ENCOUNTER — Encounter (HOSPITAL_COMMUNITY): Payer: Self-pay | Admitting: Advanced Practice Midwife

## 2013-07-28 ENCOUNTER — Inpatient Hospital Stay (HOSPITAL_COMMUNITY)
Admission: AD | Admit: 2013-07-28 | Discharge: 2013-07-29 | Disposition: A | Discharge status: Home / Self Care | Payer: Medicaid Other | Source: Ambulatory Visit | Attending: Obstetrics | Admitting: Obstetrics

## 2013-07-28 DIAGNOSIS — O34219 Maternal care for unspecified type scar from previous cesarean delivery: Secondary | ICD-10-CM | POA: Insufficient documentation

## 2013-07-28 DIAGNOSIS — O4700 False labor before 37 completed weeks of gestation, unspecified trimester: Secondary | ICD-10-CM | POA: Insufficient documentation

## 2013-07-28 DIAGNOSIS — M549 Dorsalgia, unspecified: Secondary | ICD-10-CM | POA: Insufficient documentation

## 2013-07-28 DIAGNOSIS — O09893 Supervision of other high risk pregnancies, third trimester: Secondary | ICD-10-CM

## 2013-07-28 LAB — URINALYSIS, ROUTINE W REFLEX MICROSCOPIC
BILIRUBIN URINE: NEGATIVE
Glucose, UA: NEGATIVE mg/dL
HGB URINE DIPSTICK: NEGATIVE
KETONES UR: NEGATIVE mg/dL
Nitrite: NEGATIVE
Protein, ur: NEGATIVE mg/dL
SPECIFIC GRAVITY, URINE: 1.02 (ref 1.005–1.030)
UROBILINOGEN UA: 0.2 mg/dL (ref 0.0–1.0)
pH: 6 (ref 5.0–8.0)

## 2013-07-28 LAB — URINE MICROSCOPIC-ADD ON

## 2013-07-28 LAB — FETAL FIBRONECTIN: Fetal Fibronectin: NEGATIVE

## 2013-07-28 MED ORDER — BETAMETHASONE SOD PHOS & ACET 6 (3-3) MG/ML IJ SUSP
12.0000 mg | Freq: Once | INTRAMUSCULAR | Status: AC
  Administered 2013-07-29: 12 mg via INTRAMUSCULAR
  Filled 2013-07-28: qty 2

## 2013-07-28 MED ORDER — NIFEDIPINE 10 MG PO CAPS
10.0000 mg | ORAL_CAPSULE | ORAL | Status: DC | PRN
  Administered 2013-07-28 (×2): 10 mg via ORAL
  Filled 2013-07-28 (×2): qty 1

## 2013-07-28 NOTE — MAU Note (Signed)
Contractions x 1 hour, back to back and intense per patient. Denies LOF or VB. Positive fetal movement.

## 2013-07-28 NOTE — MAU Provider Note (Signed)
Chief Complaint:  Contractions and Back Pain  First Provider Initiated Contact with Patient 07/28/13 2236    HPI: Colleen Oneill is a 23 y.o. [phone] at 3767w0d who presents to maternity admissions reporting mild-moderate contractions every 5-7 minutes x 1 hour. No problems with preterm labor this pregnancy. Denies contractions, leakage of fluid or vaginal bleeding. Good fetal movement.   Pregnancy Course: Complicated by a close pregnancy spacing and history of C-section x2.  Past Medical History: History reviewed. No pertinent past medical history.  Past obstetric history: OB History  Gravida Para Term Preterm AB SAB TAB Ectopic Multiple Living  4 3 3       3     # Outcome Date GA Lbr Len/2nd Weight Sex Delivery Anes PTL Lv  4 CUR           3 TRM 08/09/12 3655w1d  3.465 kg (7 lb 10.2 oz) M LTCS Spinal  Y  2 TRM 10/15/09 5757w0d 14:00 2.92 kg (6 lb 7 oz) M LTCS Spinal  Y     Comments: Face presentation, arrest of descent  1 TRM 08/10/08 6764w4d 14:00 2.892 kg (6 lb 6 oz) F SVD None  Y      Past Surgical History: Past Surgical History  Procedure Laterality Date  . Cesarean section    . Cesarean section N/A 08/09/2012    Procedure: Repeat CESAREAN SECTION of baby boy  at 0431  APGAR 9/9;  Surgeon: Kathreen CosierBernard A Marshall, MD;  Location: [facility];  Service: Obstetrics;  Laterality: N/A;     Family History: Family History  Problem Relation Age of Onset  . Other Neg Hx   . Kidney disease Father   . Diabetes Maternal Grandmother     Social History: History  Substance Use Topics  . Smoking status: Never Smoker   . Smokeless tobacco: Never Used  . Alcohol Use: No    Allergies: No Known Allergies  Meds:  Prescriptions prior to admission  Medication Sig Dispense Refill  . cholecalciferol (VITAMIN D) 1000 UNITS tablet Take 1 tablet (1,000 Units total) by mouth daily.      . Prenatal Vit-Fe Fumarate-FA (PRENATAL MULTIVITAMIN) TABS tablet Take 1 tablet by mouth daily at 12 noon.         ROS: Pertinent findings in history of present illness.  Physical Exam  Blood pressure 120/65, pulse 85, temperature 98.3 F (36.8 C), temperature source Oral, resp. rate 20, height 5\' 8"  (1.727 m), weight 74.844 kg (165 lb), last menstrual period 12/09/2012, SpO2 100.00%. GENERAL: Well-developed, well-nourished female in no acute distress.  HEENT: normocephalic HEART: normal rate RESP: normal effort ABDOMEN: Soft, non-tender, gravid appropriate for gestational age EXTREMITIES: Nontender, no edema NEURO: alert and oriented SPECULUM EXAM: NEFG, physiologic discharge, no blood, cervix clean Dilation: 1 Effacement (%): 50 Cervical Position: Posterior Station: Ballotable Presentation: Vertex Exam by:: Ivonne AndrewV Floriene Jeschke CNM  FHT:  Baseline 120-130 , moderate variability, accelerations present, no decelerations Contractions: q 15-20 mins, mild   Labs: Results for orders placed during the hospital encounter of 07/28/13 (from the past 24 hour(s))  URINALYSIS, ROUTINE W REFLEX MICROSCOPIC     Status: Abnormal   Collection Time    07/28/13  9:39 PM      Result Value Range   Color, Urine YELLOW  YELLOW   APPearance CLEAR  CLEAR   Specific Gravity, Urine 1.020  1.005 - 1.030   pH 6.0  5.0 - 8.0   Glucose, UA NEGATIVE  NEGATIVE mg/dL   Hgb  urine dipstick NEGATIVE  NEGATIVE   Bilirubin Urine NEGATIVE  NEGATIVE   Ketones, ur NEGATIVE  NEGATIVE mg/dL   Protein, ur NEGATIVE  NEGATIVE mg/dL   Urobilinogen, UA 0.2  0.0 - 1.0 mg/dL   Nitrite NEGATIVE  NEGATIVE   Leukocytes, UA SMALL (*) NEGATIVE  URINE MICROSCOPIC-ADD ON     Status: Abnormal   Collection Time    07/28/13  9:39 PM      Result Value Range   Squamous Epithelial / LPF FEW (*) RARE   WBC, UA 7-10  <3 WBC/hpf   Bacteria, UA FEW (*) RARE   Urine-Other MUCOUS PRESENT    FETAL FIBRONECTIN     Status: None   Collection Time    07/28/13 10:30 PM      Result Value Range   Fetal Fibronectin NEGATIVE  NEGATIVE    Imaging:  No  results found.  MAU Course: Fetal fibronectin, push oral fluids, Procardia.  Patient sleeping to contractions when seen in maternity room. Denies feeling any more contractions. Will give betamethasone series do to cervical exam and close pregnancy spacing increasing risk of preterm delivery.  Assessment: 1. Preterm uterine contractions, antepartum   2. Short interval between pregnancies complicating pregnancy in third trimester, antepartum     Plan: Discharge home in stable condition. Preterm labor precautions and fetal kick counts. Increase fluids and rest. Pelvic rest x1 week.      Follow-up Information   Follow up with [facility] [place] [facility] On 07/30/2013. (morning for second steroid shot.)    Contact information:   [address] [id number] [place] [address] [phone]      Follow up with [facility] On 08/03/2013. (as scheduled or  as needed if symptoms worsen)    Contact information:   [address] [place] [address] [phone]       Medication List         cholecalciferol 1000 UNITS tablet  Commonly known as:  VITAMIN D  Take 1 tablet (1,000 Units total) by mouth daily.     prenatal multivitamin Tabs tablet  Take 1 tablet by mouth daily at 12 noon.       [place], [place] 07/29/2013 12:33 AM

## 2013-07-28 NOTE — MAU Note (Signed)
Pt reports contractions for the last two hours

## 2013-07-29 DIAGNOSIS — O4700 False labor before 37 completed weeks of gestation, unspecified trimester: Secondary | ICD-10-CM

## 2013-07-29 LAB — URINE CULTURE
COLONY COUNT: NO GROWTH
CULTURE: NO GROWTH

## 2013-07-29 NOTE — Discharge Instructions (Signed)
Preterm Labor Information Preterm labor is when labor starts at less than 37 weeks of pregnancy. The normal length of a pregnancy is 39 to 41 weeks. CAUSES Often, there is no identifiable underlying cause as to why a woman goes into preterm labor. One of the most common known causes of preterm labor is infection. Infections of the uterus, cervix, vagina, amniotic sac, bladder, kidney, or even the lungs (pneumonia) can cause labor to start. Other suspected causes of preterm labor include:   Urogenital infections, such as yeast infections and bacterial vaginosis.   Uterine abnormalities (uterine shape, uterine septum, fibroids, or bleeding from the placenta).   A cervix that has been operated on (it may fail to stay closed).   Malformations in the fetus.   Multiple gestations (twins, triplets, and so on).   Breakage of the amniotic sac.  RISK FACTORS  Having a previous history of preterm labor.   Having premature rupture of membranes (PROM).   Having a placenta that covers the opening of the cervix (placenta previa).   Having a placenta that separates from the uterus (placental abruption).   Having a cervix that is too weak to hold the fetus in the uterus (incompetent cervix).   Having too much fluid in the amniotic sac (polyhydramnios).   Taking illegal drugs or smoking while pregnant.   Not gaining enough weight while pregnant.   Being younger than 5818 and older than 22 years old.   Having a low socioeconomic status.   Being African American. SYMPTOMS Signs and symptoms of preterm labor include:   Menstrual-like cramps, abdominal pain, or back pain.  Uterine contractions that are regular, as frequent as six in an hour, regardless of their intensity (may be mild or painful).  Contractions that start on the top of the uterus and spread down to the lower abdomen and back.   A sense of increased pelvic pressure.   A watery or bloody mucus discharge that  comes from the vagina.  TREATMENT Depending on the length of the pregnancy and other circumstances, your health care provider may suggest bed rest. If necessary, there are medicines that can be given to stop contractions and to mature the fetal lungs. If labor happens before 34 weeks of pregnancy, a prolonged hospital stay may be recommended. Treatment depends on the condition of both you and the fetus.  WHAT SHOULD YOU DO IF YOU THINK YOU ARE IN PRETERM LABOR? Call your health care provider right away. You will need to go to the hospital to get checked immediately. HOW CAN YOU PREVENT PRETERM LABOR IN FUTURE PREGNANCIES? You should:   Stop smoking if you smoke.  Maintain healthy weight gain and avoid chemicals and drugs that are not necessary.  Be watchful for any type of infection.  Inform your health care provider if you have a known history of preterm labor. Document Released: 08/30/2003 Document Revised: 02/09/2013 Document Reviewed: 07/12/2012 [facility] Patient Information 2014 [place] [employer] [place].  Fetal Movement Counts Patient Name: __________________________________________________ Patient Due Date: ____________________ Performing a fetal movement count is highly recommended in high-risk pregnancies, but it is good for every pregnant woman to do. Your caregiver may ask you to start counting fetal movements at 28 weeks of the pregnancy. Fetal movements often increase:  After eating a full meal.  After physical activity.  After eating or drinking something sweet or cold.  At rest. Pay attention to when you feel the baby is most active. This will help you notice a pattern of your  baby's sleep and wake cycles and what factors contribute to an increase in fetal movement. It is important to perform a fetal movement count at the same time each day when your baby is normally most active.  HOW TO COUNT FETAL MOVEMENTS 1. Find a quiet and comfortable area to sit or lie down on your left  side. Lying on your left side provides the best blood and oxygen circulation to your baby. 2. Write down the day and time on a sheet of paper or in a journal. 3. Start counting kicks, flutters, swishes, rolls, or jabs in a 2 hour period. You should feel at least 10 movements within 2 hours. 4. If you do not feel 10 movements in 2 hours, wait 2 3 hours and count again. Look for a change in the pattern or not enough counts in 2 hours. SEEK MEDICAL CARE IF:  You feel less than 10 counts in 2 hours, tried twice.  There is no movement in over an hour.  The pattern is changing or taking longer each day to reach 10 counts in 2 hours.  You feel the baby is not moving as he or she usually does. Date: ____________ Movements: ____________ Start time: ____________ Colleen MartinFinish time: ____________  Date: ____________ Movements: ____________ Start time: ____________ Colleen MartinFinish time: ____________ Date: ____________ Movements: ____________ Start time: ____________ Colleen MartinFinish time: ____________ Date: ____________ Movements: ____________ Start time: ____________ Colleen MartinFinish time: ____________ Date: ____________ Movements: ____________ Start time: ____________ Colleen MartinFinish time: ____________ Date: ____________ Movements: ____________ Start time: ____________ Colleen MartinFinish time: ____________ Date: ____________ Movements: ____________ Start time: ____________ Colleen MartinFinish time: ____________ Date: ____________ Movements: ____________ Start time: ____________ Colleen MartinFinish time: ____________  Date: ____________ Movements: ____________ Start time: ____________ Colleen MartinFinish time: ____________ Date: ____________ Movements: ____________ Start time: ____________ Colleen MartinFinish time: ____________ Date: ____________ Movements: ____________ Start time: ____________ Colleen MartinFinish time: ____________ Date: ____________ Movements: ____________ Start time: ____________ Colleen MartinFinish time: ____________ Date: ____________ Movements: ____________ Start time: ____________ Colleen MartinFinish time:  ____________ Date: ____________ Movements: ____________ Start time: ____________ Colleen MartinFinish time: ____________ Date: ____________ Movements: ____________ Start time: ____________ Colleen MartinFinish time: ____________  Date: ____________ Movements: ____________ Start time: ____________ Colleen MartinFinish time: ____________ Date: ____________ Movements: ____________ Start time: ____________ Colleen MartinFinish time: ____________ Date: ____________ Movements: ____________ Start time: ____________ Colleen MartinFinish time: ____________ Date: ____________ Movements: ____________ Start time: ____________ Colleen MartinFinish time: ____________ Date: ____________ Movements: ____________ Start time: ____________ Colleen MartinFinish time: ____________ Date: ____________ Movements: ____________ Start time: ____________ Colleen MartinFinish time: ____________ Date: ____________ Movements: ____________ Start time: ____________ Colleen MartinFinish time: ____________  Date: ____________ Movements: ____________ Start time: ____________ Colleen MartinFinish time: ____________ Date: ____________ Movements: ____________ Start time: ____________ Colleen MartinFinish time: ____________ Date: ____________ Movements: ____________ Start time: ____________ Colleen MartinFinish time: ____________ Date: ____________ Movements: ____________ Start time: ____________ Colleen MartinFinish time: ____________ Date: ____________ Movements: ____________ Start time: ____________ Colleen MartinFinish time: ____________ Date: ____________ Movements: ____________ Start time: ____________ Colleen MartinFinish time: ____________ Date: ____________ Movements: ____________ Start time: ____________ Colleen MartinFinish time: ____________  Date: ____________ Movements: ____________ Start time: ____________ Colleen MartinFinish time: ____________ Date: ____________ Movements: ____________ Start time: ____________ Colleen MartinFinish time: ____________ Date: ____________ Movements: ____________ Start time: ____________ Colleen MartinFinish time: ____________ Date: ____________ Movements: ____________ Start time: ____________ Colleen MartinFinish time: ____________ Date: ____________ Movements:  ____________ Start time: ____________ Colleen MartinFinish time: ____________ Date: ____________ Movements: ____________ Start time: ____________ Colleen MartinFinish time: ____________ Date: ____________ Movements: ____________ Start time: ____________ Colleen MartinFinish time: ____________  Date: ____________ Movements: ____________ Start time: ____________ Colleen MartinFinish time: ____________ Date: ____________ Movements: ____________ Start time: ____________ Colleen MartinFinish time: ____________ Date: ____________ Movements: ____________ Start time: ____________ Colleen MartinFinish time:  Date: ____________ Movements: ____________ Start time: ____________ Finish time: ____________ °Date: ____________ Movements: ____________ Start time: ____________ Finish time: ____________ °Date: ____________ Movements: ____________ Start time: ____________ Finish time: ____________ °Date: ____________ Movements: ____________ Start time: ____________ Finish time: ____________  °Date: ____________ Movements: ____________ Start time: ____________ Finish time: ____________ °Date: ____________ Movements: ____________ Start time: ____________ Finish time: ____________ °Date: ____________ Movements: ____________ Start time: ____________ Finish time: ____________ °Date: ____________ Movements: ____________ Start time: ____________ Finish time: ____________ °Date: ____________ Movements: ____________ Start time: ____________ Finish time: ____________ °Date: ____________ Movements: ____________ Start time: ____________ Finish time: ____________ °Date: ____________ Movements: ____________ Start time: ____________ Finish time: ____________  °Date: ____________ Movements: ____________ Start time: ____________ Finish time: ____________ °Date: ____________ Movements: ____________ Start time: ____________ Finish time: ____________ °Date: ____________ Movements: ____________ Start time: ____________ Finish time: ____________ °Date: ____________ Movements: ____________ Start time: ____________ Finish  time: ____________ °Date: ____________ Movements: ____________ Start time: ____________ Finish time: ____________ °Date: ____________ Movements: ____________ Start time: ____________ Finish time: ____________ °Document Released: 07/09/2006 Document Revised: 05/26/2012 Document Reviewed: 04/05/2012 °[employer]® Patient Information ©2014 [employer]. ° °

## 2013-07-30 ENCOUNTER — Inpatient Hospital Stay (HOSPITAL_COMMUNITY)
Admission: AD | Admit: 2013-07-30 | Discharge: 2013-07-31 | Disposition: A | Discharge status: Home / Self Care | Payer: Medicaid Other | Source: Ambulatory Visit | Attending: Obstetrics | Admitting: Obstetrics

## 2013-07-30 DIAGNOSIS — O4700 False labor before 37 completed weeks of gestation, unspecified trimester: Secondary | ICD-10-CM

## 2013-07-30 DIAGNOSIS — O2390 Unspecified genitourinary tract infection in pregnancy, unspecified trimester: Secondary | ICD-10-CM | POA: Insufficient documentation

## 2013-07-30 DIAGNOSIS — O34219 Maternal care for unspecified type scar from previous cesarean delivery: Secondary | ICD-10-CM | POA: Insufficient documentation

## 2013-07-30 DIAGNOSIS — N390 Urinary tract infection, site not specified: Secondary | ICD-10-CM | POA: Insufficient documentation

## 2013-07-30 DIAGNOSIS — O2340 Unspecified infection of urinary tract in pregnancy, unspecified trimester: Secondary | ICD-10-CM

## 2013-07-30 HISTORY — DX: Other specified health status: Z78.9

## 2013-07-31 ENCOUNTER — Encounter (HOSPITAL_COMMUNITY): Payer: Self-pay

## 2013-07-31 DIAGNOSIS — O4700 False labor before 37 completed weeks of gestation, unspecified trimester: Secondary | ICD-10-CM

## 2013-07-31 LAB — URINALYSIS, ROUTINE W REFLEX MICROSCOPIC
Bilirubin Urine: NEGATIVE
Glucose, UA: NEGATIVE mg/dL
Hgb urine dipstick: NEGATIVE
KETONES UR: NEGATIVE mg/dL
Nitrite: NEGATIVE
PH: 6 (ref 5.0–8.0)
Protein, ur: NEGATIVE mg/dL
Specific Gravity, Urine: 1.025 (ref 1.005–1.030)
Urobilinogen, UA: 1 mg/dL (ref 0.0–1.0)

## 2013-07-31 LAB — URINE MICROSCOPIC-ADD ON

## 2013-07-31 MED ORDER — BETAMETHASONE SOD PHOS & ACET 6 (3-3) MG/ML IJ SUSP
12.0000 mg | INTRAMUSCULAR | Status: AC
Start: 2013-07-31 — End: 2013-07-31
  Administered 2013-07-31: 12 mg via INTRAMUSCULAR
  Filled 2013-07-31: qty 2

## 2013-07-31 MED ORDER — NITROFURANTOIN MONOHYD MACRO 100 MG PO CAPS
100.0000 mg | ORAL_CAPSULE | ORAL | Status: AC
  Administered 2013-07-31: 100 mg via ORAL
  Filled 2013-07-31: qty 1

## 2013-07-31 MED ORDER — NITROFURANTOIN MONOHYD MACRO 100 MG PO CAPS: 100.0000 mg | ORAL_CAPSULE | Freq: Two times a day (BID) | ORAL | Status: DC

## 2013-07-31 NOTE — MAU Provider Note (Signed)
Chief Complaint:  Contractions   First Provider Initiated Contact with Patient 07/31/13 0021      HPI: Colleen Oneill is a 23 y.o. [phone] at 5533w3dwho presents to maternity admissions reporting contractions 6x/hour in last 2 hours.  She was seen in MAU 07/28/13 and had BMZ injection early in am on 07/29/13 with plan to return in 24 hours for second dose. She had negative FFN on 07/28/13.  She was unable to come in last night and did not get injection.  She reports good fetal movement, denies LOF, vaginal bleeding, vaginal itching/burning, urinary symptoms, h/a, dizziness, n/v, or fever/chills.     Past Medical History: Past Medical History  Diagnosis Date  . Medical history non-contributory     Past obstetric history: OB History  Gravida Para Term Preterm AB SAB TAB Ectopic Multiple Living  4 3 3       3     # Outcome Date GA Lbr Len/2nd Weight Sex Delivery Anes PTL Lv  4 CUR           3 TRM 08/09/12 862w1d  3.465 kg (7 lb 10.2 oz) M LTCS Spinal  Y  2 TRM 10/15/09 3321w0d 14:00 2.92 kg (6 lb 7 oz) M LTCS Spinal  Y     Comments: Face presentation, arrest of descent  1 TRM 08/10/08 2149w4d 14:00 2.892 kg (6 lb 6 oz) F SVD None  Y      Past Surgical History: Past Surgical History  Procedure Laterality Date  . Cesarean section    . Cesarean section N/A 08/09/2012    Procedure: Repeat CESAREAN SECTION of baby boy  at 0431  APGAR 9/9;  Surgeon: Kathreen CosierBernard A Marshall, MD;  Location: [facility];  Service: Obstetrics;  Laterality: N/A;    Family History: Family History  Problem Relation Age of Onset  . Other Neg Hx   . Kidney disease Father   . Diabetes Maternal Grandmother     Social History: History  Substance Use Topics  . Smoking status: Never Smoker   . Smokeless tobacco: Never Used  . Alcohol Use: No    Allergies: No Known Allergies  Meds:  Prescriptions prior to admission  Medication Sig Dispense Refill  . cholecalciferol (VITAMIN D) 1000 UNITS tablet Take 1 tablet (1,000 Units  total) by mouth daily.      . Prenatal Vit-Fe Fumarate-FA (PRENATAL MULTIVITAMIN) TABS tablet Take 1 tablet by mouth daily at 12 noon.        ROS: Pertinent findings in history of present illness.  Physical Exam  Blood pressure 118/65, pulse 107, temperature 98.4 F (36.9 C), temperature source Oral, resp. rate 18, height 5\' 8"  (1.727 m), weight 73.199 kg (161 lb 6 oz), last menstrual period 12/09/2012. GENERAL: Well-developed, well-nourished female in no acute distress.  HEENT: normocephalic HEART: normal rate RESP: normal effort ABDOMEN: Soft, non-tender, gravid appropriate for gestational age EXTREMITIES: Nontender, no edema NEURO: alert and oriented  Dilation: 1 Effacement (%): 50 Cervical Position: Posterior Station: Ballotable Presentation: Vertex Exam by:: Shirlyn GoltzLisa Leftwich-Kerby CNM  Cervix unchanged from exam 48 hours ago  FHT:  Baseline 135 , moderate variability, accelerations present, no decelerations Contractions: q 10-15 mins with irritability in between, mild to palpation   Labs: Results for orders placed during the hospital encounter of 07/30/13 (from the past 24 hour(s))  URINALYSIS, ROUTINE W REFLEX MICROSCOPIC     Status: Abnormal   Collection Time    07/30/13 11:48 PM      Result Value  Range   Color, Urine YELLOW  YELLOW   APPearance CLEAR  CLEAR   Specific Gravity, Urine 1.025  1.005 - 1.030   pH 6.0  5.0 - 8.0   Glucose, UA NEGATIVE  NEGATIVE mg/dL   Hgb urine dipstick NEGATIVE  NEGATIVE   Bilirubin Urine NEGATIVE  NEGATIVE   Ketones, ur NEGATIVE  NEGATIVE mg/dL   Protein, ur NEGATIVE  NEGATIVE mg/dL   Urobilinogen, UA 1.0  0.0 - 1.0 mg/dL   Nitrite NEGATIVE  NEGATIVE   Leukocytes, UA SMALL (*) NEGATIVE  URINE MICROSCOPIC-ADD ON     Status: Abnormal   Collection Time    07/30/13 11:48 PM      Result Value Range   Squamous Epithelial / LPF FEW (*) RARE   WBC, UA 7-10  <3 WBC/hpf   Bacteria, UA FEW (*) RARE   Urine-Other MUCOUS PRESENT       Assessment: 1. Threatened preterm labor   2. UTI (urinary tract infection) during pregnancy     Plan: BMZ second dose given in MAU Discharge home PTL precautions and fetal kick counts Treat for UTI in pregnancy based on leukocytes, WBCs. Urine sent for culture Macrobid 100 mg BID x7 days.  First dose given in MAU r/t late hour. F/U in office with Dr Clearance Coots Return to MAU as needed      Follow-up Information   Follow up with HARPER,CHARLES A, MD.   Specialty:  Obstetrics and Gynecology   Contact information:   [address] [place] [address] [phone]        Medication List         cholecalciferol 1000 UNITS tablet  Commonly known as:  VITAMIN D  Take 1 tablet (1,000 Units total) by mouth daily.     nitrofurantoin (macrocrystal-monohydrate) 100 MG capsule  Commonly known as:  MACROBID  Take 1 capsule (100 mg total) by mouth 2 (two) times daily.     prenatal multivitamin Tabs tablet  Take 1 tablet by mouth daily at 12 noon.        Sharen Counter Certified Nurse-Midwife 07/31/2013 1:03 AM

## 2013-07-31 NOTE — MAU Note (Signed)
Felt more than 6 contractions an hour , started 2 hours ago.  States she is returning to get Betamethasone shot, the second injection.  No bleeding, no leaking, Reports while having contractions in last 2 hours, baby not moving as well, states now is moving better since here.

## 2013-07-31 NOTE — Discharge Instructions (Signed)
Pregnancy and Urinary Tract Infection °A urinary tract infection (UTI) is a bacterial infection of the urinary tract. Infection of the urinary tract can include the ureters, kidneys (pyelonephritis), bladder (cystitis), and urethra (urethritis). All pregnant women should be screened for bacteria in the urinary tract. Identifying and treating a UTI will decrease the risk of preterm labor and developing more serious infections in both the mother and baby. °CAUSES °Bacteria germs cause almost all UTIs.  °RISK FACTORS °Many factors can increase your chances of getting a UTI during pregnancy. These include: °· Having a short urethra. °· Poor toilet and hygiene habits. °· Sexual intercourse. °· Blockage of urine along the urinary tract. °· Problems with the pelvic muscles or nerves. °· Diabetes. °· Obesity. °· Bladder problems after having several children. °· Previous history of UTI. °SIGNS AND SYMPTOMS  °· Pain, burning, or a stinging feeling when urinating. °· Suddenly feeling the need to urinate right away (urgency). °· Loss of bladder control (urinary incontinence). °· Frequent urination, more than is common with pregnancy. °· Lower abdominal or back discomfort. °· Cloudy urine. °· Blood in the urine (hematuria). °· Fever.  °When the kidneys are infected, the symptoms may be: °· Back pain. °· Flank pain on the right side more so than the left. °· Fever. °· Chills. °· Nausea. °· Vomiting. °DIAGNOSIS  °A urinary tract infection is usually diagnosed through urine tests. Additional tests and procedures are sometimes done. These may include: °· Ultrasound exam of the kidneys, ureters, bladder, and urethra. °· Looking in the bladder with a lighted tube (cystoscopy). °TREATMENT °Typically, UTIs can be treated with antibiotic medicines.  °HOME CARE INSTRUCTIONS  °· Only take over-the-counter or prescription medicines as directed by your health care provider. If you were prescribed antibiotics, take them as directed. Finish  them even if you start to feel better. °· Drink enough fluids to keep your urine clear or pale yellow. °· Do not have sexual intercourse until the infection is gone and your health care provider says it is okay. °· Make sure you are tested for UTIs throughout your pregnancy. These infections often come back.  °Preventing a UTI in the Future °· Practice good toilet habits. Always wipe from front to back. Use the tissue only once. °· Do not hold your urine. Empty your bladder as soon as possible when the urge comes. °· Do not douche or use deodorant sprays. °· Wash with soap and warm water around the genital area and the anus. °· Empty your bladder before and after sexual intercourse. °· Wear underwear with a cotton crotch. °· Avoid caffeine and carbonated drinks. They can irritate the bladder. °· Drink cranberry juice or take cranberry pills. This may decrease the risk of getting a UTI. °· Do not drink alcohol. °· Keep all your appointments and tests as scheduled.  °SEEK MEDICAL CARE IF:  °· Your symptoms get worse. °· You are still having fevers 2 or more days after treatment begins. °· You have a rash. °· You feel that you are having problems with medicines prescribed. °· You have abnormal vaginal discharge. °SEEK IMMEDIATE MEDICAL CARE IF:  °· You have back or flank pain. °· You have chills. °· You have blood in your urine. °· You have nausea and vomiting. °· You have contractions of your uterus. °· You have a gush of fluid from the vagina. °MAKE SURE YOU: °· Understand these instructions.   °· Will watch your condition.   °· Will get help right away if you are not doing   well or get worse.   °Document Released: 10/04/2010 Document Revised: 03/30/2013 Document Reviewed: 01/06/2013 °ExitCare® Patient Information ©2014 [employer]. °Preterm Labor Information °Preterm labor is when labor starts at less than 37 weeks of pregnancy. The normal length of a pregnancy is 39 to 41 weeks. °CAUSES °Often, there is no  identifiable underlying cause as to why a woman goes into preterm labor. One of the most common known causes of preterm labor is infection. Infections of the uterus, cervix, vagina, amniotic sac, bladder, kidney, or even the lungs (pneumonia) can cause labor to start. Other suspected causes of preterm labor include:  °· Urogenital infections, such as yeast infections and bacterial vaginosis.   °· Uterine abnormalities (uterine shape, uterine septum, fibroids, or bleeding from the placenta).   °· A cervix that has been operated on (it may fail to stay closed).   °· Malformations in the fetus.   °· Multiple gestations (twins, triplets, and so on).   °· Breakage of the amniotic sac.   °RISK FACTORS °· Having a previous history of preterm labor.   °· Having premature rupture of membranes (PROM).   °· Having a placenta that covers the opening of the cervix (placenta previa).   °· Having a placenta that separates from the uterus (placental abruption).   °· Having a cervix that is too weak to hold the fetus in the uterus (incompetent cervix).   °· Having too much fluid in the amniotic sac (polyhydramnios).   °· Taking illegal drugs or smoking while pregnant.   °· Not gaining enough weight while pregnant.   °· Being younger than 18 and older than 22 years old.   °· Having a low socioeconomic status.   °· Being African American. °SYMPTOMS °Signs and symptoms of preterm labor include:  °· Menstrual-like cramps, abdominal pain, or back pain. °· Uterine contractions that are regular, as frequent as six in an hour, regardless of their intensity (may be mild or painful). °· Contractions that start on the top of the uterus and spread down to the lower abdomen and back.   °· A sense of increased pelvic pressure.   °· A watery or bloody mucus discharge that comes from the vagina.   °TREATMENT °Depending on the length of the pregnancy and other circumstances, your health care provider may suggest bed rest. If necessary, there are  medicines that can be given to stop contractions and to mature the fetal lungs. If labor happens before 34 weeks of pregnancy, a prolonged hospital stay may be recommended. Treatment depends on the condition of both you and the fetus.  °WHAT SHOULD YOU DO IF YOU THINK YOU ARE IN PRETERM LABOR? °Call your health care provider right away. You will need to go to the hospital to get checked immediately. °HOW CAN YOU PREVENT PRETERM LABOR IN FUTURE PREGNANCIES? °You should:  °· Stop smoking if you smoke.  °· Maintain healthy weight gain and avoid chemicals and drugs that are not necessary. °· Be watchful for any type of infection. °· Inform your health care provider if you have a known history of preterm labor. °Document Released: 08/30/2003 Document Revised: 02/09/2013 Document Reviewed: 07/12/2012 °ExitCare® Patient Information ©2014 [employer]. ° °

## 2013-08-02 LAB — URINE CULTURE: Colony Count: 15000

## 2013-08-03 ENCOUNTER — Ambulatory Visit (INDEPENDENT_AMBULATORY_CARE_PROVIDER_SITE_OTHER): Payer: Medicaid Other | Admitting: Obstetrics

## 2013-08-03 ENCOUNTER — Encounter: Payer: Self-pay | Admitting: Obstetrics

## 2013-08-03 VITALS — BP 124/76 | Temp 98.1°F | Wt 161.0 lb

## 2013-08-03 DIAGNOSIS — Z3480 Encounter for supervision of other normal pregnancy, unspecified trimester: Secondary | ICD-10-CM

## 2013-08-03 LAB — POCT URINALYSIS DIPSTICK
Bilirubin, UA: NEGATIVE
Glucose, UA: NEGATIVE
Ketones, UA: NEGATIVE
LEUKOCYTES UA: NEGATIVE
Nitrite, UA: NEGATIVE
Protein, UA: NEGATIVE
RBC UA: NEGATIVE
Spec Grav, UA: 1.02
UROBILINOGEN UA: NEGATIVE
pH, UA: 6

## 2013-08-03 NOTE — Progress Notes (Signed)
Pulse: 102

## 2013-08-16 ENCOUNTER — Encounter (HOSPITAL_COMMUNITY): Payer: Self-pay | Admitting: *Deleted

## 2013-08-16 ENCOUNTER — Inpatient Hospital Stay (HOSPITAL_COMMUNITY)
Admission: AD | Admit: 2013-08-16 | Discharge: 2013-08-16 | Disposition: A | Discharge status: Home / Self Care | Payer: Medicaid Other | Source: Ambulatory Visit | Attending: Obstetrics | Admitting: Obstetrics

## 2013-08-16 DIAGNOSIS — N390 Urinary tract infection, site not specified: Secondary | ICD-10-CM | POA: Insufficient documentation

## 2013-08-16 DIAGNOSIS — O479 False labor, unspecified: Secondary | ICD-10-CM

## 2013-08-16 DIAGNOSIS — O4700 False labor before 37 completed weeks of gestation, unspecified trimester: Secondary | ICD-10-CM | POA: Insufficient documentation

## 2013-08-16 DIAGNOSIS — O2390 Unspecified genitourinary tract infection in pregnancy, unspecified trimester: Secondary | ICD-10-CM | POA: Insufficient documentation

## 2013-08-16 LAB — URINALYSIS, ROUTINE W REFLEX MICROSCOPIC
BILIRUBIN URINE: NEGATIVE
Glucose, UA: NEGATIVE mg/dL
HGB URINE DIPSTICK: NEGATIVE
Ketones, ur: NEGATIVE mg/dL
NITRITE: NEGATIVE
PROTEIN: NEGATIVE mg/dL
Specific Gravity, Urine: >1.03 — ABNORMAL HIGH (ref 1.005–1.030)
UROBILINOGEN UA: 0.2 mg/dL (ref 0.0–1.0)
pH: 6 (ref 5.0–8.0)

## 2013-08-16 LAB — URINE MICROSCOPIC-ADD ON

## 2013-08-16 MED ORDER — LACTATED RINGERS IV BOLUS (SEPSIS): 1000.0000 mL | Freq: Once | INTRAVENOUS | Status: DC

## 2013-08-16 NOTE — MAU Provider Note (Signed)
History     CSN: [id number]  Arrival date and time: 08/16/13 [id number]   First Provider Initiated Contact with Patient 08/16/13 2006      Chief Complaint  Patient presents with  . Labor Eval   HPI Colleen Oneill is a 23 y.o. [phone] at 5168w5d who presents to MAU today with complaint of regular contractions q 5 minutes and increased pressure in the lower pelvis. The patient denies vaginal bleeding, discharge or LOF. Reports good fetal movement. No N/V/D or fever. Currently being treated for UTI on Macrobid. Pain rated at 6/10 with contractions.   OB History   Grav Para Term Preterm Abortions TAB SAB Ect Mult Living   4 3 3       3       Past Medical History  Diagnosis Date  . Medical history non-contributory     Past Surgical History  Procedure Laterality Date  . Cesarean section    . Cesarean section N/A 08/09/2012    Procedure: Repeat CESAREAN SECTION of baby boy  at 0431  APGAR 9/9;  Surgeon: Kathreen CosierBernard A Marshall, MD;  Location: [facility];  Service: Obstetrics;  Laterality: N/A;    Family History  Problem Relation Age of Onset  . Other Neg Hx   . Kidney disease Father   . Diabetes Maternal Grandmother     History  Substance Use Topics  . Smoking status: Never Smoker   . Smokeless tobacco: Never Used  . Alcohol Use: No    Allergies: No Known Allergies  Prescriptions prior to admission  Medication Sig Dispense Refill  . cholecalciferol (VITAMIN D) 1000 UNITS tablet Take 1 tablet (1,000 Units total) by mouth daily.      . nitrofurantoin, macrocrystal-monohydrate, (MACROBID) 100 MG capsule Take 1 capsule (100 mg total) by mouth 2 (two) times daily.  14 capsule  0  . Prenatal Vit-Fe Fumarate-FA (PRENATAL MULTIVITAMIN) TABS tablet Take 1 tablet by mouth daily at 12 noon.        Review of Systems  Constitutional: Negative for fever.  Gastrointestinal: Positive for abdominal pain. Negative for nausea, vomiting and diarrhea.  Genitourinary: Negative for dysuria, urgency  and frequency.       Neg - vaginal bleeding, discharge, LOF   Physical Exam   Blood pressure 107/77, pulse 82, temperature 98.3 F (36.8 C), temperature source Oral, resp. rate 18, height 5\' 8"  (1.727 m), weight 161 lb (73.029 kg), last menstrual period 12/09/2012.  Physical Exam  Constitutional: She is oriented to person, place, and time. She appears well-developed and well-nourished. No distress.  HENT:  Head: Normocephalic and atraumatic.  Cardiovascular: Normal rate.   Respiratory: Effort normal.  Neurological: She is alert and oriented to person, place, and time.  Skin: Skin is warm and dry. No erythema.  Psychiatric: She has a normal mood and affect.  Dilation: 2 Effacement (%): 50 Station: -3 Presentation: Vertex Exam by:: TINA, CNM- S  Results for orders placed during the hospital encounter of 08/16/13 (from the past 24 hour(s))  URINALYSIS, ROUTINE W REFLEX MICROSCOPIC     Status: Abnormal   Collection Time    08/16/13  7:30 PM      Result Value Ref Range   Color, Urine YELLOW  YELLOW   APPearance CLEAR  CLEAR   Specific Gravity, Urine >1.030 (*) 1.005 - 1.030   pH 6.0  5.0 - 8.0   Glucose, UA NEGATIVE  NEGATIVE mg/dL   Hgb urine dipstick NEGATIVE  NEGATIVE   Bilirubin  Urine NEGATIVE  NEGATIVE   Ketones, ur NEGATIVE  NEGATIVE mg/dL   Protein, ur NEGATIVE  NEGATIVE mg/dL   Urobilinogen, UA 0.2  0.0 - 1.0 mg/dL   Nitrite NEGATIVE  NEGATIVE   Leukocytes, UA SMALL (*) NEGATIVE  URINE MICROSCOPIC-ADD ON     Status: Abnormal   Collection Time    08/16/13  7:30 PM      Result Value Ref Range   Squamous Epithelial / LPF FEW (*) RARE   WBC, UA 7-10  <3 WBC/hpf   RBC / HPF 3-6  <3 RBC/hpf   Bacteria, UA FEW (*) RARE   Fetal Monitoring: Baseline: 120 bpm, moderate variability, + accelerations, no decelerations Contractions: irregular with moderate UI MAU Course  Procedures None  MDM Cervix unchanged from previous visit on first exam. Will monitor and recheck in  1 hour.  Cervix unchanged again after > 1 hour. Patient does not appear uncomfortable with contractions. Rates pain at 6/10 Discussed patient with Dr. Clearance Coots. Hydrate patient and then discharge home with labor precautions.  Assessment and Plan  A: Preterm contractions  P: Discharge home Recommended increased PO hydration as tolerated Labor precautions discussed Patient to follow-up with Dr. Clearance Coots as scheduled for routine prenatal care Patient may return to MAU as needed or if her condition were to change or worsen  Freddi Starr, PA-C  08/16/2013, 8:40 PM

## 2013-08-16 NOTE — MAU Note (Signed)
Pt is 23 yrs old and is a G4P3; was suppose to be taking Macrobid for UTI but did not pick up rx -(per pharmacy); pt states that she did pick-up rx and has been taking it;

## 2013-08-16 NOTE — MAU Note (Signed)
C/o contractions on&off since yesterday; @ 1830 today contractions became stronger and more regular;

## 2013-08-16 NOTE — Discharge Instructions (Signed)

## 2013-08-17 ENCOUNTER — Encounter: Payer: Medicaid Other | Admitting: Obstetrics

## 2013-08-20 ENCOUNTER — Inpatient Hospital Stay (HOSPITAL_COMMUNITY)
Admission: AD | Admit: 2013-08-20 | Discharge: 2013-08-20 | Disposition: A | Discharge status: Home / Self Care | Payer: Medicaid Other | Source: Ambulatory Visit | Attending: Obstetrics | Admitting: Obstetrics

## 2013-08-20 ENCOUNTER — Encounter (HOSPITAL_COMMUNITY): Payer: Self-pay | Admitting: *Deleted

## 2013-08-20 DIAGNOSIS — O99891 Other specified diseases and conditions complicating pregnancy: Secondary | ICD-10-CM | POA: Insufficient documentation

## 2013-08-20 DIAGNOSIS — M549 Dorsalgia, unspecified: Secondary | ICD-10-CM | POA: Insufficient documentation

## 2013-08-20 DIAGNOSIS — N949 Unspecified condition associated with female genital organs and menstrual cycle: Secondary | ICD-10-CM | POA: Insufficient documentation

## 2013-08-20 DIAGNOSIS — O9989 Other specified diseases and conditions complicating pregnancy, childbirth and the puerperium: Secondary | ICD-10-CM

## 2013-08-20 LAB — URINALYSIS, ROUTINE W REFLEX MICROSCOPIC
Bilirubin Urine: NEGATIVE
GLUCOSE, UA: NEGATIVE mg/dL
HGB URINE DIPSTICK: NEGATIVE
Ketones, ur: NEGATIVE mg/dL
Nitrite: NEGATIVE
Protein, ur: NEGATIVE mg/dL
Specific Gravity, Urine: 1.02 (ref 1.005–1.030)
Urobilinogen, UA: 1 mg/dL (ref 0.0–1.0)
pH: 7.5 (ref 5.0–8.0)

## 2013-08-20 LAB — URINE MICROSCOPIC-ADD ON

## 2013-08-20 MED ORDER — ACETAMINOPHEN 325 MG PO TABS
650.0000 mg | ORAL_TABLET | Freq: Once | ORAL | Status: AC
  Administered 2013-08-20: 650 mg via ORAL
  Filled 2013-08-20: qty 2

## 2013-08-20 NOTE — MAU Provider Note (Signed)
History     CSN: [id number]  Arrival date and time: 08/20/13 1008   Seen by provider at 1100    Chief Complaint  Patient presents with  . Pelvic Pain   HPI Colleen Oneill 23 y.o. [redacted]w[redacted]d  Comes to MAU with worsening low pelvic pain and midline low back pain.  States she miserable.  Is thinking she will have a C/S with this baby - has previously had one vaginal and then 2 C/S births.  Did not sleep well last night.  States her back pain was a problem.  Denies any leaking of fluid or vaginal bleeding.  Client is finishing antibiotics today  - prescribed in the office 2 weeks ago for UTI.  OB History   Grav Para Term Preterm Abortions TAB SAB Ect Mult Living   4 3 3       3       Past Medical History  Diagnosis Date  . Medical history non-contributory     Past Surgical History  Procedure Laterality Date  . Cesarean section    . Cesarean section N/A 08/09/2012    Procedure: Repeat CESAREAN SECTION of baby boy  at 0431  APGAR 9/9;  Surgeon: Kathreen Cosier, MD;  Location: [facility];  Service: Obstetrics;  Laterality: N/A;    Family History  Problem Relation Age of Onset  . Other Neg Hx   . Kidney disease Father   . Diabetes Maternal Grandmother     History  Substance Use Topics  . Smoking status: Never Smoker   . Smokeless tobacco: Never Used  . Alcohol Use: No    Allergies: No Known Allergies  Prescriptions prior to admission  Medication Sig Dispense Refill  . cholecalciferol (VITAMIN D) 1000 UNITS tablet Take 1 tablet (1,000 Units total) by mouth daily.      . nitrofurantoin, macrocrystal-monohydrate, (MACROBID) 100 MG capsule Take 1 capsule (100 mg total) by mouth 2 (two) times daily.  14 capsule  0  . Prenatal Vit-Fe Fumarate-FA (PRENATAL MULTIVITAMIN) TABS tablet Take 1 tablet by mouth daily at 12 noon.        Review of Systems  Constitutional: Negative for fever.  Gastrointestinal: Positive for abdominal pain. Negative for nausea and vomiting.  Genitourinary:        No vaginal discharge. No vaginal bleeding. No dysuria.  Musculoskeletal: Positive for back pain.   Physical Exam   Height 5\' 8"  (1.727 m), weight 165 lb 2 oz (74.9 kg), last menstrual period 12/09/2012, not currently breastfeeding.  Physical Exam  Nursing note and vitals reviewed. Constitutional: She is oriented to person, place, and time. She appears well-developed and well-nourished.  HENT:  Head: Normocephalic.  Eyes: EOM are normal.  Neck: Neck supple.  GI: Soft. There is no tenderness.  On fetal monitor - having irregular contractions.  FHT baseline 140 with moderate variability and 15x15 accerations noted - reactive strip.  Genitourinary:  Cervical exam by RN - 2 cm.  Musculoskeletal: Normal range of motion.  Back pain is at sacrum and has when she has a contraction.  Neurological: She is alert and oriented to person, place, and time.  Skin: Skin is warm and dry.  Psychiatric: She has a normal mood and affect.    MAU Course  Procedures Results for orders placed during the hospital encounter of 08/20/13 (from the past 24 hour(s))  URINALYSIS, ROUTINE W REFLEX MICROSCOPIC     Status: Abnormal   Collection Time    08/20/13 10:18 AM  Result Value Ref Range   Color, Urine YELLOW  YELLOW   APPearance CLEAR  CLEAR   Specific Gravity, Urine 1.020  1.005 - 1.030   pH 7.5  5.0 - 8.0   Glucose, UA NEGATIVE  NEGATIVE mg/dL   Hgb urine dipstick NEGATIVE  NEGATIVE   Bilirubin Urine NEGATIVE  NEGATIVE   Ketones, ur NEGATIVE  NEGATIVE mg/dL   Protein, ur NEGATIVE  NEGATIVE mg/dL   Urobilinogen, UA 1.0  0.0 - 1.0 mg/dL   Nitrite NEGATIVE  NEGATIVE   Leukocytes, UA LARGE (*) NEGATIVE  URINE MICROSCOPIC-ADD ON     Status: Abnormal   Collection Time    08/20/13 10:18 AM      Result Value Ref Range   Squamous Epithelial / LPF MANY (*) RARE   WBC, UA TOO NUMEROUS TO COUNT  <3 WBC/hpf   Bacteria, UA FEW (*) RARE   Urine-Other MUCOUS PRESENT      MDM Cervix has not  changed since client was seen in MAU on 08-16-13.  Irregular contractions.  Tylenol 650 mg PO given for back pain.   1125  Call to Dr. Gaynell FaceMarshall who is in surgery.   Assessment and Plan  Back pain in pregnancy, not in labor  Plan Reviewed with client reasons to return  - leaking of fluid, regular contractions that become progressively worse Encouraged increasing PO fluids Take Tylenol 325 mg 2 tablets by mouth every 4 hours if needed for pain. Keep your appointment in the office on Tuesday Call the office if you have questions or concerns.  BURLESON,TERRI 08/20/2013, 11:01 AM

## 2013-08-20 NOTE — Discharge Instructions (Signed)
Drink at least 8 8-oz glasses of water every day. Take Tylenol 325 mg 2 tablets by mouth every 4 hours if needed for pain. Keep your appointment in the office on Tuesday. Call your doctor if you have questions or concerns. Return if you develop regular, stronger contractions or have any vaginal bleeding or leaking of fluid.

## 2013-08-20 NOTE — MAU Note (Signed)
Pt states here for pelvic pain that now radiates to her back since this morning. Is intermittent. Denies bleeding, however does note clear vaginal discharge.

## 2013-08-22 ENCOUNTER — Inpatient Hospital Stay (HOSPITAL_COMMUNITY)
Admission: AD | Admit: 2013-08-22 | Discharge: 2013-08-22 | Disposition: A | Discharge status: Home / Self Care | Payer: Medicaid Other | Source: Ambulatory Visit | Attending: Obstetrics | Admitting: Obstetrics

## 2013-08-22 ENCOUNTER — Encounter (HOSPITAL_COMMUNITY): Payer: Self-pay | Admitting: *Deleted

## 2013-08-22 ENCOUNTER — Inpatient Hospital Stay (HOSPITAL_COMMUNITY)
Admission: AD | Admit: 2013-08-22 | Discharge: 2013-08-22 | Disposition: A | Discharge status: Home / Self Care | Payer: Medicaid Other | Source: Ambulatory Visit | Attending: Obstetrics & Gynecology | Admitting: Obstetrics & Gynecology

## 2013-08-22 DIAGNOSIS — O321XX0 Maternal care for breech presentation, not applicable or unspecified: Secondary | ICD-10-CM | POA: Diagnosis present

## 2013-08-22 DIAGNOSIS — O99891 Other specified diseases and conditions complicating pregnancy: Secondary | ICD-10-CM | POA: Insufficient documentation

## 2013-08-22 DIAGNOSIS — O34219 Maternal care for unspecified type scar from previous cesarean delivery: Secondary | ICD-10-CM | POA: Diagnosis present

## 2013-08-22 DIAGNOSIS — O9989 Other specified diseases and conditions complicating pregnancy, childbirth and the puerperium: Secondary | ICD-10-CM

## 2013-08-22 DIAGNOSIS — N898 Other specified noninflammatory disorders of vagina: Secondary | ICD-10-CM | POA: Insufficient documentation

## 2013-08-22 DIAGNOSIS — O4700 False labor before 37 completed weeks of gestation, unspecified trimester: Secondary | ICD-10-CM | POA: Insufficient documentation

## 2013-08-22 DIAGNOSIS — D649 Anemia, unspecified: Secondary | ICD-10-CM | POA: Diagnosis present

## 2013-08-22 DIAGNOSIS — O9902 Anemia complicating childbirth: Secondary | ICD-10-CM | POA: Diagnosis present

## 2013-08-22 LAB — URINALYSIS, ROUTINE W REFLEX MICROSCOPIC
Bilirubin Urine: NEGATIVE
Glucose, UA: NEGATIVE mg/dL
KETONES UR: NEGATIVE mg/dL
NITRITE: NEGATIVE
Protein, ur: NEGATIVE mg/dL
Specific Gravity, Urine: 1.02 (ref 1.005–1.030)
UROBILINOGEN UA: 2 mg/dL — AB (ref 0.0–1.0)
pH: 6.5 (ref 5.0–8.0)

## 2013-08-22 LAB — URINE MICROSCOPIC-ADD ON

## 2013-08-22 NOTE — MAU Note (Signed)
C/o ?SROM; back on efm; NP to assess for ROM;

## 2013-08-22 NOTE — MAU Note (Addendum)
Patient presents with complaints of contractions 5 minutes apart since 3:30am. In addition she is having pain at her c-section incision site that started yesterday. Denies vaginal bleeding and leaking of fluid. Reports good fetal movement.

## 2013-08-22 NOTE — MAU Note (Signed)
C/o incisional pain and ucs since 0330 this AM;

## 2013-08-22 NOTE — MAU Note (Signed)
PT SAYS SHE WAS HERE TODAY - WENT HOME AT 12.  WAS HERE FOR UC-   VE  2 CM.    HAS CONTINUED TO HAVE UC AT HOME -   RETURNING  FOR  UC.    DENIES HSV AND MRSA.    FIRST BABY WAS C/S- THIS  SHE IS UNDECIDED  FOR VBAC.

## 2013-08-22 NOTE — MAU Provider Note (Signed)
HPI:  Ms. Derrill MemoMacy R Gali is a 23 y.o. female [phone] at 4755w4d who presents for a labor evaluation. The nurse called me in prior to the patient being discharged home to perform a speculum exam. The nurse said she walked in the room and saw a small- moderate amount of clear fluid on the floor. The patient states she noticed it when she stood up to get dressed. The RN is concerned that the patient may have spilt water on the floor.     Objective:  GENERAL: Well-developed, well-nourished female in no acute distress.  HEENT: Normocephalic, atraumatic.   LUNGS: Effort normal HEART: Regular rate  SKIN: Warm, dry and without erythema PSYCH: Normal mood and affect  Speculum exam: Vagina - Small amount of creamy discharge, thick, mucus like discharge. No odor, no pooling of fluid  Cervix - Scant contact bleeding. Pt has had 2 cervical exams this AM.  Bimanual exam: Deferred, checked by RN  Chaperone present for exam.   MDM: Crist FatFern slide; negative   A:  Vaginal discharge in pregnancy  Negative fern slide, negative ROM  P:  RN to discharge patient home with labor precautions Pt instructed to return with any further leaking of fluid Kick counts     Iona HansenJennifer Irene Rasch, NP 08/22/2013 7:31 PM

## 2013-08-22 NOTE — Discharge Instructions (Signed)
Preterm Birth Preterm birth is a birth that happens before 37 weeks of pregnancy. Most pregnancies last about 39 41 weeks. Every week in the womb is important and is beneficial to the health of the infant. Infants born before 37 weeks of pregnancy are at a higher risk for complications. Depending on when the infant was born, he or she may be:  Late preterm. Born between 32 weeks and 37 weeks of pregnancy.  Very preterm. Born at less than 32 weeks of pregnancy.  Extremely preterm. Born at less than 25 weeks of pregnancy. The earlier a baby is born, the more likely the child will have issues related to prematurity. Complications and problems that can be seen in infants born too early include:  Problems breathing (respiratory distress syndrome).  Low birth weight.  Problems feeding.  Sleeping problems.  Yellowing of the skin (jaundice).  Infections such as pneumonia. Babies born very preterm or extremely preterm are at risk for more serious medical issues. These include:  More severe breathing issues.  Eyesight issues.  Brain development issues (intraventricular hemorrhage).  Behavioral and emotional development issues.  Growth and developmental delays.  Cerebral palsy.  Serious feeding or bowel complications (necrotizing enterocolitis). CAUSES  There are two broad categories of preterm birth.  Spontaneous preterm birth. This is a birth resulting from preterm labor (not medically induced) or preterm premature rupture of membranes (PPROM).  Indicated preterm birth. This is a birth resulting from labor being medically induced due to health, personal, or social reasons. RISK FACTORS Preterm birth may be related to certain medical conditions, lifestyle factors, or demographic factors encountered by the mother or fetus.  Medical conditions include:  Multiple gestations (twins, triplets, and so on).  Infection.  Diabetes.  Heart disease.  Kidney disease.  Cervical or  uterine abnormalities.  Being underweight.  High blood pressure or preeclampsia.  Premature rupture of membranes (PROM).  Birth defects in the fetus.  Lifestyle factors include:  Poor prenatal care.  Poor nutrition or anemia.  Cigarette smoking.  Consuming alcohol.  High levels of stress and lack of social or emotional support.  Exposure to chemical or environmental toxins.  Substance abuse.  Demographic factors include:  African-American ethnicity.  Age (younger than 4618 or older than 22 years of age).  Low socioeconomic status. Women with a history of preterm labor or who become pregnant within 3218 months of giving birth are also at increased risk for preterm birth. DIAGNOSIS  Your health care provider may request additional tests to diagnose underlying complications resulting from preterm birth. Tests on the infant may include:  Physical exam.  Blood tests.  Chest X-rays.  Heart-lung monitoring. TREATMENT  After birth, special care will be taken to assess any problems or complications for the infant. Supportive care will be provided for the infant. Treatment depends on what problems are present and any complications that develop. Some preterm infants are cared for in a neonatal intensive care unit. In general, care may include:  Maintaining temperature and oxygen in a clear heated box (baby isolette).  Monitoring the infant's heart rate, breathing, and level of oxygen in the blood.  Monitoring for signs of infection and, if needed, giving IV antibiotic medicine.  Inserting a feeding tube (nose, mouth) or giving IV nutrition if unable to feed.  Inserting a breathing tube (ventilation).  Respiration support (continuous positive airway pressure [CPAP] or oxygen). Treatment will change as the infant builds up strength and is able to breathe and eat on his or  her own. For some infants, no special treatment is necessary. Parents may be educated on the potential  health risks of prematurity to the infant. HOME CARE INSTRUCTIONS  Understand your infant's special conditions and needs. It may be reassuring to learn about infant CPR.  Monitor your infant in the car seat until he or she grows and matures. Infant car seats can cause breathing difficulties for preterm infants.  Keep your infant warm. Dress your infant in layers and keep him or her away from drafts, especially in cold months of the year.  Wash your hands thoroughly after going to the bathroom or changing a diaper. Late preterm infants may be more prone to infection.  Follow all your health care provider's instructions for providing support and care to your preterm infant.  Get support from organizations and groups that understand your challenges.  Follow up with your infant's health care provider as directed. Prevention There are some things you can do to help lower your risk of having a preterm infant in the future. These include:  Good prenatal care throughout the entire pregnancy. See a health care provider regularly for advice and tests.  Management of underlying medical conditions.  Proper self-care and lifestyle changes.  Proper diet and weight control.  Watching for signs of various infections. SEEK MEDICAL CARE IF:  Your infant has feeding difficulties.  Your infant has sleeping difficulties.  Your infant has breathing difficulties.  Your infant's skin starts to look yellow.  Your infant shows signs of infection, such as a stuffy nose, fever, crying, or bluish color of the skin. FOR MORE INFORMATION March of D[employer]: www.marchofdimes.com Prematurity.org: www.prematurity.org Document Released: 08/30/2003 Document Revised: 03/30/2013 Document Reviewed: 01/06/2013 [facility] Patient Information 2014 [place], [place].

## 2013-08-23 ENCOUNTER — Encounter (HOSPITAL_COMMUNITY)
Admission: AD | Disposition: A | Discharge status: Home / Self Care | Payer: Self-pay | Source: Ambulatory Visit | Attending: Obstetrics

## 2013-08-23 ENCOUNTER — Inpatient Hospital Stay (HOSPITAL_COMMUNITY)
Admission: AD | Admit: 2013-08-23 | Discharge: 2013-08-26 | DRG: 765 | Disposition: A | Discharge status: Home / Self Care | Payer: Medicaid Other | Source: Ambulatory Visit | Attending: Obstetrics | Admitting: Obstetrics

## 2013-08-23 ENCOUNTER — Encounter (HOSPITAL_COMMUNITY): Payer: Medicaid Other | Admitting: Anesthesiology

## 2013-08-23 ENCOUNTER — Inpatient Hospital Stay (HOSPITAL_COMMUNITY): Payer: Medicaid Other | Admitting: Anesthesiology

## 2013-08-23 ENCOUNTER — Encounter: Payer: Medicaid Other | Admitting: Obstetrics

## 2013-08-23 ENCOUNTER — Inpatient Hospital Stay (HOSPITAL_COMMUNITY): Payer: Medicaid Other

## 2013-08-23 ENCOUNTER — Encounter (HOSPITAL_COMMUNITY): Payer: Self-pay

## 2013-08-23 DIAGNOSIS — O4700 False labor before 37 completed weeks of gestation, unspecified trimester: Secondary | ICD-10-CM | POA: Diagnosis present

## 2013-08-23 DIAGNOSIS — O479 False labor, unspecified: Secondary | ICD-10-CM | POA: Diagnosis present

## 2013-08-23 LAB — CBC
HCT: 27.5 % — ABNORMAL LOW (ref 36.0–46.0)
Hemoglobin: 9 g/dL — ABNORMAL LOW (ref 12.0–15.0)
MCH: 27 pg (ref 26.0–34.0)
MCHC: 32.7 g/dL (ref 30.0–36.0)
MCV: 82.6 fL (ref 78.0–100.0)
Platelets: 261 10*3/uL (ref 150–400)
RBC: 3.33 MIL/uL — ABNORMAL LOW (ref 3.87–5.11)
RDW: 14.7 % (ref 11.5–15.5)
WBC: 16.6 10*3/uL — AB (ref 4.0–10.5)

## 2013-08-23 LAB — TYPE AND SCREEN
ABO/RH(D): O POS
Antibody Screen: NEGATIVE

## 2013-08-23 SURGERY — Surgical Case
Anesthesia: General

## 2013-08-23 MED ORDER — ACETAMINOPHEN 325 MG PO TABS: 650.0000 mg | ORAL_TABLET | ORAL | Status: DC | PRN

## 2013-08-23 MED ORDER — SCOPOLAMINE 1 MG/3DAYS TD PT72
MEDICATED_PATCH | TRANSDERMAL | Status: AC
  Filled 2013-08-23: qty 1

## 2013-08-23 MED ORDER — MORPHINE SULFATE (PF) 0.5 MG/ML IJ SOLN
INTRAMUSCULAR | Status: DC | PRN
  Administered 2013-08-23: .15 mg via EPIDURAL

## 2013-08-23 MED ORDER — DIPHENHYDRAMINE HCL 50 MG/ML IJ SOLN: 12.5000 mg | INTRAMUSCULAR | Status: DC | PRN

## 2013-08-23 MED ORDER — TETANUS-DIPHTH-ACELL PERTUSSIS 5-2.5-18.5 LF-MCG/0.5 IM SUSP: 0.5000 mL | Freq: Once | INTRAMUSCULAR | Status: DC

## 2013-08-23 MED ORDER — SIMETHICONE 80 MG PO CHEW
80.0000 mg | CHEWABLE_TABLET | Freq: Three times a day (TID) | ORAL | Status: DC
  Administered 2013-08-23 – 2013-08-26 (×6): 80 mg via ORAL
  Filled 2013-08-23 (×6): qty 1

## 2013-08-23 MED ORDER — OXYCODONE-ACETAMINOPHEN 5-325 MG PO TABS: 1.0000 | ORAL_TABLET | ORAL | Status: DC | PRN

## 2013-08-23 MED ORDER — IBUPROFEN 600 MG PO TABS
600.0000 mg | ORAL_TABLET | Freq: Four times a day (QID) | ORAL | Status: DC
  Administered 2013-08-24: 600 mg via ORAL
  Filled 2013-08-23: qty 1

## 2013-08-23 MED ORDER — LANOLIN HYDROUS EX OINT: 1.0000 "application " | TOPICAL_OINTMENT | CUTANEOUS | Status: DC | PRN

## 2013-08-23 MED ORDER — NALOXONE HCL 0.4 MG/ML IJ SOLN: 0.4000 mg | INTRAMUSCULAR | Status: DC | PRN

## 2013-08-23 MED ORDER — ONDANSETRON HCL 4 MG/2ML IJ SOLN: 4.0000 mg | INTRAMUSCULAR | Status: DC | PRN

## 2013-08-23 MED ORDER — PRENATAL MULTIVITAMIN CH: 1.0000 | ORAL_TABLET | Freq: Every day | ORAL | Status: DC

## 2013-08-23 MED ORDER — KETOROLAC TROMETHAMINE 30 MG/ML IJ SOLN
INTRAMUSCULAR | Status: AC
  Administered 2013-08-23: 30 mg via INTRAVENOUS
  Filled 2013-08-23: qty 1

## 2013-08-23 MED ORDER — DIPHENHYDRAMINE HCL 25 MG PO CAPS: 25.0000 mg | ORAL_CAPSULE | Freq: Four times a day (QID) | ORAL | Status: DC | PRN

## 2013-08-23 MED ORDER — LACTATED RINGERS IV SOLN: INTRAVENOUS | Status: DC

## 2013-08-23 MED ORDER — ZOLPIDEM TARTRATE 5 MG PO TABS: 5.0000 mg | ORAL_TABLET | Freq: Every evening | ORAL | Status: DC | PRN

## 2013-08-23 MED ORDER — MEPERIDINE HCL 25 MG/ML IJ SOLN: 6.2500 mg | INTRAMUSCULAR | Status: DC | PRN

## 2013-08-23 MED ORDER — SODIUM CHLORIDE 0.9 % IJ SOLN: 3.0000 mL | INTRAMUSCULAR | Status: DC | PRN

## 2013-08-23 MED ORDER — CITRIC ACID-SODIUM CITRATE 334-500 MG/5ML PO SOLN
30.0000 mL | Freq: Once | ORAL | Status: AC
  Administered 2013-08-23: 30 mL via ORAL

## 2013-08-23 MED ORDER — SIMETHICONE 80 MG PO CHEW
80.0000 mg | CHEWABLE_TABLET | ORAL | Status: DC
  Administered 2013-08-23 – 2013-08-25 (×3): 80 mg via ORAL
  Filled 2013-08-23 (×3): qty 1

## 2013-08-23 MED ORDER — OXYTOCIN 40 UNITS IN LACTATED RINGERS INFUSION - SIMPLE MED: 62.5000 mL/h | INTRAVENOUS | Status: DC

## 2013-08-23 MED ORDER — HYDROXYZINE HCL 25 MG PO TABS
25.0000 mg | ORAL_TABLET | Freq: Four times a day (QID) | ORAL | Status: DC | PRN
  Administered 2013-08-23: 25 mg via ORAL
  Filled 2013-08-23: qty 1

## 2013-08-23 MED ORDER — SENNOSIDES-DOCUSATE SODIUM 8.6-50 MG PO TABS
2.0000 | ORAL_TABLET | ORAL | Status: DC
  Administered 2013-08-23 – 2013-08-24 (×2): 1 via ORAL
  Filled 2013-08-23: qty 2
  Filled 2013-08-23 (×2): qty 1

## 2013-08-23 MED ORDER — DIPHENHYDRAMINE HCL 50 MG/ML IJ SOLN: 25.0000 mg | INTRAMUSCULAR | Status: DC | PRN

## 2013-08-23 MED ORDER — WITCH HAZEL-GLYCERIN EX PADS: 1.0000 "application " | MEDICATED_PAD | CUTANEOUS | Status: DC | PRN

## 2013-08-23 MED ORDER — CITRIC ACID-SODIUM CITRATE 334-500 MG/5ML PO SOLN
ORAL | Status: AC
  Filled 2013-08-23: qty 15

## 2013-08-23 MED ORDER — SCOPOLAMINE 1 MG/3DAYS TD PT72
1.0000 | MEDICATED_PATCH | Freq: Once | TRANSDERMAL | Status: DC
Start: 2013-08-23 — End: 2013-08-23
  Administered 2013-08-23: 1.5 mg via TRANSDERMAL

## 2013-08-23 MED ORDER — MENTHOL 3 MG MT LOZG: 1.0000 | LOZENGE | OROMUCOSAL | Status: DC | PRN

## 2013-08-23 MED ORDER — CEFAZOLIN SODIUM-DEXTROSE 2-3 GM-% IV SOLR
INTRAVENOUS | Status: AC
  Filled 2013-08-23: qty 50

## 2013-08-23 MED ORDER — NALBUPHINE HCL 10 MG/ML IJ SOLN
10.0000 mg | Freq: Four times a day (QID) | INTRAMUSCULAR | Status: DC | PRN
  Administered 2013-08-23: 10 mg via INTRAMUSCULAR
  Filled 2013-08-23: qty 1

## 2013-08-23 MED ORDER — LACTATED RINGERS IV BOLUS (SEPSIS)
1000.0000 mL | Freq: Once | INTRAVENOUS | Status: AC
  Administered 2013-08-23: 1000 mL via INTRAVENOUS

## 2013-08-23 MED ORDER — MORPHINE SULFATE (PF) 0.5 MG/ML IJ SOLN
INTRAMUSCULAR | Status: DC | PRN
  Administered 2013-08-23: 1 mg via EPIDURAL
  Administered 2013-08-23: 1.35 mg via EPIDURAL
  Administered 2013-08-23: 1 mg via INTRAVENOUS

## 2013-08-23 MED ORDER — DIPHENHYDRAMINE HCL 12.5 MG/5ML PO ELIX
25.0000 mg | ORAL_SOLUTION | Freq: Four times a day (QID) | ORAL | Status: DC | PRN
  Administered 2013-08-23: 12.5 mg via ORAL
  Filled 2013-08-23: qty 10

## 2013-08-23 MED ORDER — ONDANSETRON HCL 4 MG/2ML IJ SOLN: 4.0000 mg | Freq: Three times a day (TID) | INTRAMUSCULAR | Status: DC | PRN

## 2013-08-23 MED ORDER — CALCIUM CARBONATE ANTACID 500 MG PO CHEW: 2.0000 | CHEWABLE_TABLET | ORAL | Status: DC | PRN

## 2013-08-23 MED ORDER — DIPHENHYDRAMINE HCL 25 MG PO CAPS: 25.0000 mg | ORAL_CAPSULE | ORAL | Status: DC | PRN

## 2013-08-23 MED ORDER — FENTANYL CITRATE 0.05 MG/ML IJ SOLN
INTRAMUSCULAR | Status: DC | PRN
  Administered 2013-08-23 (×2): 50 ug via INTRAVENOUS

## 2013-08-23 MED ORDER — ONDANSETRON HCL 4 MG/2ML IJ SOLN
INTRAMUSCULAR | Status: AC
  Filled 2013-08-23: qty 2

## 2013-08-23 MED ORDER — OXYTOCIN 10 UNIT/ML IJ SOLN
40.0000 [IU] | INTRAMUSCULAR | Status: DC | PRN
  Administered 2013-08-23: 40 [IU] via INTRAVENOUS

## 2013-08-23 MED ORDER — MEDROXYPROGESTERONE ACETATE 150 MG/ML IM SUSP: 150.0000 mg | INTRAMUSCULAR | Status: DC | PRN

## 2013-08-23 MED ORDER — ONDANSETRON HCL 4 MG PO TABS: 4.0000 mg | ORAL_TABLET | ORAL | Status: DC | PRN

## 2013-08-23 MED ORDER — FENTANYL CITRATE 0.05 MG/ML IJ SOLN
INTRAMUSCULAR | Status: AC
  Filled 2013-08-23: qty 2

## 2013-08-23 MED ORDER — SENNOSIDES-DOCUSATE SODIUM 8.6-50 MG PO TABS: 2.0000 | ORAL_TABLET | ORAL | Status: DC

## 2013-08-23 MED ORDER — LACTATED RINGERS IV SOLN
INTRAVENOUS | Status: DC | PRN
  Administered 2013-08-23 (×2): via INTRAVENOUS

## 2013-08-23 MED ORDER — MORPHINE SULFATE 0.5 MG/ML IJ SOLN
INTRAMUSCULAR | Status: AC
  Filled 2013-08-23: qty 10

## 2013-08-23 MED ORDER — EPHEDRINE 5 MG/ML INJ
INTRAVENOUS | Status: AC
  Filled 2013-08-23: qty 10

## 2013-08-23 MED ORDER — IBUPROFEN 100 MG/5ML PO SUSP
600.0000 mg | Freq: Four times a day (QID) | ORAL | Status: DC
Start: 2013-08-23 — End: 2013-08-26
  Administered 2013-08-23 – 2013-08-26 (×12): 600 mg via ORAL
  Filled 2013-08-23 (×16): qty 30

## 2013-08-23 MED ORDER — HYDROMORPHONE HCL PF 1 MG/ML IJ SOLN
0.2500 mg | INTRAMUSCULAR | Status: DC | PRN
Start: 2013-08-23 — End: 2013-08-23

## 2013-08-23 MED ORDER — CEFAZOLIN SODIUM-DEXTROSE 2-3 GM-% IV SOLR
INTRAVENOUS | Status: DC | PRN
  Administered 2013-08-23: 2 g via INTRAVENOUS

## 2013-08-23 MED ORDER — IBUPROFEN 600 MG PO TABS: 600.0000 mg | ORAL_TABLET | Freq: Four times a day (QID) | ORAL | Status: DC

## 2013-08-23 MED ORDER — DEXTROSE 5 % IV SOLN
1.0000 ug/kg/h | INTRAVENOUS | Status: DC | PRN
  Filled 2013-08-23: qty 2

## 2013-08-23 MED ORDER — DIBUCAINE 1 % RE OINT: 1.0000 "application " | TOPICAL_OINTMENT | RECTAL | Status: DC | PRN

## 2013-08-23 MED ORDER — NALBUPHINE HCL 10 MG/ML IJ SOLN
10.0000 mg | INTRAMUSCULAR | Status: DC | PRN
  Filled 2013-08-23: qty 1

## 2013-08-23 MED ORDER — HYDROXYZINE HCL 50 MG/ML IM SOLN: 50.0000 mg | Freq: Four times a day (QID) | INTRAMUSCULAR | Status: DC | PRN

## 2013-08-23 MED ORDER — SIMETHICONE 80 MG PO CHEW: 80.0000 mg | CHEWABLE_TABLET | ORAL | Status: DC | PRN

## 2013-08-23 MED ORDER — NALBUPHINE HCL 10 MG/ML IJ SOLN: 5.0000 mg | INTRAMUSCULAR | Status: DC | PRN

## 2013-08-23 MED ORDER — KETOROLAC TROMETHAMINE 30 MG/ML IJ SOLN
30.0000 mg | Freq: Four times a day (QID) | INTRAMUSCULAR | Status: DC | PRN
  Administered 2013-08-23: 30 mg via INTRAVENOUS

## 2013-08-23 MED ORDER — SIMETHICONE 80 MG PO CHEW: 80.0000 mg | CHEWABLE_TABLET | ORAL | Status: DC

## 2013-08-23 MED ORDER — BUTORPHANOL TARTRATE 1 MG/ML IJ SOLN
1.0000 mg | Freq: Once | INTRAMUSCULAR | Status: AC
  Administered 2013-08-23: 1 mg via INTRAVENOUS
  Filled 2013-08-23: qty 1

## 2013-08-23 MED ORDER — KETOROLAC TROMETHAMINE 30 MG/ML IJ SOLN: 30.0000 mg | Freq: Four times a day (QID) | INTRAMUSCULAR | Status: DC | PRN

## 2013-08-23 MED ORDER — ONDANSETRON HCL 4 MG/2ML IJ SOLN
INTRAMUSCULAR | Status: DC | PRN
  Administered 2013-08-23: 4 mg via INTRAVENOUS

## 2013-08-23 MED ORDER — PRENATAL MULTIVITAMIN CH
1.0000 | ORAL_TABLET | Freq: Every day | ORAL | Status: DC
  Administered 2013-08-24: 1 via ORAL
  Filled 2013-08-23: qty 1

## 2013-08-23 MED ORDER — NIFEDIPINE 10 MG PO CAPS: 10.0000 mg | ORAL_CAPSULE | Freq: Once | ORAL | Status: DC

## 2013-08-23 MED ORDER — SIMETHICONE 80 MG PO CHEW: 80.0000 mg | CHEWABLE_TABLET | Freq: Three times a day (TID) | ORAL | Status: DC

## 2013-08-23 MED ORDER — OXYTOCIN 10 UNIT/ML IJ SOLN
INTRAMUSCULAR | Status: AC
  Filled 2013-08-23: qty 4

## 2013-08-23 MED ORDER — DOCUSATE SODIUM 100 MG PO CAPS: 100.0000 mg | ORAL_CAPSULE | Freq: Every day | ORAL | Status: DC

## 2013-08-23 MED ORDER — LACTATED RINGERS IV SOLN
INTRAVENOUS | Status: DC
  Administered 2013-08-23 (×5): via INTRAVENOUS

## 2013-08-23 MED ORDER — METOCLOPRAMIDE HCL 5 MG/ML IJ SOLN: 10.0000 mg | Freq: Three times a day (TID) | INTRAMUSCULAR | Status: DC | PRN

## 2013-08-23 MED ORDER — PHENYLEPHRINE 8 MG IN D5W 100 ML (0.08MG/ML) PREMIX OPTIME
INJECTION | INTRAVENOUS | Status: DC | PRN
  Administered 2013-08-23: 45 ug/min via INTRAVENOUS

## 2013-08-23 MED ORDER — PHENYLEPHRINE HCL 10 MG/ML IJ SOLN
INTRAMUSCULAR | Status: AC
  Filled 2013-08-23: qty 1

## 2013-08-23 SURGICAL SUPPLY — 45 items
CANISTER WOUND CARE 500ML ATS (WOUND CARE) IMPLANT
CLAMP CORD UMBIL (MISCELLANEOUS) IMPLANT
CLOTH BEACON ORANGE TIMEOUT ST (SAFETY) ×3 IMPLANT
CONTAINER PREFILL 10% NBF 15ML (MISCELLANEOUS) ×6 IMPLANT
DERMABOND ADVANCED (GAUZE/BANDAGES/DRESSINGS)
DERMABOND ADVANCED .7 DNX12 (GAUZE/BANDAGES/DRESSINGS) IMPLANT
DRAPE LG THREE QUARTER DISP (DRAPES) IMPLANT
DRSG OPSITE POSTOP 4X10 (GAUZE/BANDAGES/DRESSINGS) ×3 IMPLANT
DRSG VAC ATS LRG SENSATRAC (GAUZE/BANDAGES/DRESSINGS) IMPLANT
DRSG VAC ATS MED SENSATRAC (GAUZE/BANDAGES/DRESSINGS) IMPLANT
DRSG VAC ATS SM SENSATRAC (GAUZE/BANDAGES/DRESSINGS) IMPLANT
DURAPREP 26ML APPLICATOR (WOUND CARE) IMPLANT
ELECT REM PT RETURN 9FT ADLT (ELECTROSURGICAL) ×3
ELECTRODE REM PT RTRN 9FT ADLT (ELECTROSURGICAL) ×1 IMPLANT
EXTRACTOR VACUUM M CUP 4 TUBE (SUCTIONS) IMPLANT
EXTRACTOR VACUUM M CUP 4' TUBE (SUCTIONS)
GLOVE BIO SURGEON STRL SZ8 (GLOVE) ×6 IMPLANT
GOWN STRL REUS W/ TWL XL LVL3 (GOWN DISPOSABLE) ×2 IMPLANT
GOWN STRL REUS W/TWL LRG LVL3 (GOWN DISPOSABLE) ×3 IMPLANT
GOWN STRL REUS W/TWL XL LVL3 (GOWN DISPOSABLE) ×4
KIT ABG SYR 3ML LUER SLIP (SYRINGE) ×3 IMPLANT
NEEDLE HYPO 25X5/8 SAFETYGLIDE (NEEDLE) ×3 IMPLANT
NS IRRIG 1000ML POUR BTL (IV SOLUTION) ×6 IMPLANT
PACK C SECTION WH (CUSTOM PROCEDURE TRAY) ×3 IMPLANT
PAD OB MATERNITY 4.3X12.25 (PERSONAL CARE ITEMS) ×3 IMPLANT
RTRCTR C-SECT PINK 25CM LRG (MISCELLANEOUS) ×3 IMPLANT
STAPLER VISISTAT 35W (STAPLE) ×3 IMPLANT
SUT GUT PLAIN 0 CT-3 TAN 27 (SUTURE) IMPLANT
SUT MNCRL 0 VIOLET CTX 36 (SUTURE) ×3 IMPLANT
SUT MNCRL AB 4-0 PS2 18 (SUTURE) IMPLANT
SUT MON AB 2-0 CT1 27 (SUTURE) ×3 IMPLANT
SUT MON AB 2-0 SH 27 (SUTURE) ×2
SUT MON AB 2-0 SH27 (SUTURE) ×1 IMPLANT
SUT MON AB 3-0 SH 27 (SUTURE)
SUT MON AB 3-0 SH27 (SUTURE) IMPLANT
SUT MONOCRYL 0 CTX 36 (SUTURE) ×6
SUT PDS AB 0 CTX 60 (SUTURE) IMPLANT
SUT PLAIN 2 0 XLH (SUTURE) IMPLANT
SUT VIC AB 0 CTX 36 (SUTURE) ×2
SUT VIC AB 0 CTX36XBRD ANBCTRL (SUTURE) ×1 IMPLANT
SUT VIC AB 2-0 CT1 27 (SUTURE)
SUT VIC AB 2-0 CT1 TAPERPNT 27 (SUTURE) IMPLANT
TOWEL OR 17X24 6PK STRL BLUE (TOWEL DISPOSABLE) ×3 IMPLANT
TRAY FOLEY CATH 14FR (SET/KITS/TRAYS/PACK) ×3 IMPLANT
WATER STERILE IRR 1000ML POUR (IV SOLUTION) IMPLANT

## 2013-08-23 NOTE — Anesthesia Preprocedure Evaluation (Signed)
Anesthesia Evaluation  Patient identified by MRN, date of birth, ID band Patient awake    Reviewed: Allergy & Precautions, H&P , Patient's Chart, lab work & pertinent test results, reviewed documented beta blocker date and time   Airway Mallampati: II TM Distance: >3 FB Neck ROM: full    Dental no notable dental hx.    Pulmonary  breath sounds clear to auscultation  Pulmonary exam normal       Cardiovascular Rhythm:regular Rate:Normal     Neuro/Psych    GI/Hepatic   Endo/Other    Renal/GU      Musculoskeletal   Abdominal   Peds  Hematology   Anesthesia Other Findings Met in Hallway en route to OR Denies allegies, #160, No med problems Plan RSI with last FHR 60's  Reproductive/Obstetrics                           Anesthesia Physical Anesthesia Plan  ASA: II and emergent  Anesthesia Plan: General   Post-op Pain Management:    Induction: Intravenous  Airway Management Planned: Oral ETT  Additional Equipment:   Intra-op Plan:   Post-operative Plan: Extubation in OR  Informed Consent: I have reviewed the patients History and Physical, chart, labs and discussed the procedure including the risks, benefits and alternatives for the proposed anesthesia with the patient or authorized representative who has indicated his/her understanding and acceptance.   Dental Advisory Given and Dental advisory given  Plan Discussed with: CRNA and Surgeon  Anesthesia Plan Comments: (  Discussed general anesthesia, including possible nausea, instrumentation of airway, sore throat,pulmonary aspiration, etc. I asked if the were any outstanding questions, or  concerns before we proceeded. )        Anesthesia Quick Evaluation

## 2013-08-23 NOTE — Progress Notes (Signed)
At 0730 patient called out for pain medicine. I went to assess the patient. She was writhing around in the bed in severe pain. Fetal heart rate showed good variability, but decelerations. MD notified. I checked the patient's cervix and I could not feel a presenting part, when she was previously vertex. MD on phone and orders to get a STAT bedside US. Kirstie PeriAmy Wrenn, CNM came to the bedside a few minutes later. She checked her cervix and could not feel a presenting part either. Amy went with us to the OR. Faculty Practice MD on standby in OR until Dr. Clearance CootsHarper arrives. US at bedside and confirmed baby was breech. At this point fetus was in distress based on tracing. Pt. Was prepped for OR. On arrival to OR, FHR 130s. (See documentation and fetal monitoring strip for HR tracing). NICU notified.

## 2013-08-23 NOTE — Progress Notes (Signed)
08/23/13 1500  Clinical Encounter Type  Visited With Patient  Visit Type Initial;Spiritual support;Social support  Referral From Nurse  Spiritual Encounters  Spiritual Needs Emotional  Stress Factors  Patient Stress Factors Loss of control;Major life changes   Made initial visit to introduce spiritual care and chaplain availability, particularly given ante RN report of pt's delivery experience and baby's apgars.  Colleen Oneill used the visit to share and process her story of premature labor and delivery, speaking primarily about events and details, rather than feelings.  She names nervousness and concern about baby Elena's (spelling?) needs, contrasting this experience with her three previous term deliveries and rooming-in experiences.  She is looking forward to SO/FOB Walter's visit this afternoon, which will be his first time getting to see baby.  [place] will follow for support, but please also page as needs arise:  [phone].  Thank you.  [address]Chaplain Alton Tremblay [place], [place] [phone]

## 2013-08-23 NOTE — Anesthesia Procedure Notes (Signed)

## 2013-08-23 NOTE — Progress Notes (Signed)
UR completed 

## 2013-08-23 NOTE — H&P (Signed)
Colleen Oneill is Oneill 23 y.o. female presenting for contractions. Maternal Medical History:  Reason for admission: Contractions.  The patient has presented on multiple occasions during the past week with similar complaints.  Contractions: Onset was 1 week ago.   Frequency: regular.   Perceived severity is moderate.    Fetal activity: Perceived fetal activity is normal.    Prenatal complications: Threatened preterm labor  Prenatal Complications - Diabetes: none.    OB History   Grav Para Term Preterm Abortions TAB SAB Ect Mult Living   4 3 3       3      Past Medical History  Diagnosis Date  . Medical history non-contributory    Past Surgical History  Procedure Laterality Date  . Cesarean section    . Cesarean section N/Oneill 08/09/2012    Procedure: Repeat CESAREAN SECTION of baby boy  at 0431  APGAR 9/9;  Surgeon: Kathreen CosierBernard Oneill Marshall, MD;  Location: [facility];  Service: Obstetrics;  Laterality: N/Oneill;   Family History: family history includes Diabetes in her maternal grandmother; Kidney disease in her father. There is no history of Other. Social History:  reports that she has never smoked. She has never used smokeless tobacco. She reports that she does not drink alcohol or use illicit drugs.     Review of Systems  Constitutional: Negative for fever.  Eyes: Negative for blurred vision.  Respiratory: Negative for shortness of breath.   Gastrointestinal: Negative for vomiting.  Skin: Negative for rash.  Neurological: Negative for headaches.    Dilation: 3 Effacement (%): 50 Station: -2 Exam by:: C Jones RN Blood pressure 99/66, pulse 98, temperature 98.5 F (36.9 C), temperature source Oral, resp. rate 20, height 5\' 8"  (1.727 m), weight 75.751 kg (167 lb), last menstrual period 12/09/2012, not currently breastfeeding. Maternal Exam:  Abdomen: not evaluated.  Introitus: not evaluated.   Cervix: Cervix evaluated by digital exam.     Fetal Exam Fetal Monitor Review:  Variability: moderate (6-25 bpm).   Pattern: accelerations present and no decelerations.    Fetal State Assessment: Category I - tracings are normal.     Physical Exam  Constitutional: She appears well-developed.  HENT:  Head: Normocephalic.  Neck: Neck supple. No thyromegaly present.  Cardiovascular: Normal rate and regular rhythm.   Respiratory: Breath sounds normal.  GI: Soft. Bowel sounds are normal.  Skin: No rash noted.    Prenatal labs: ABO, Rh: O/POS/-- (09/22 1602) Antibody: NEG (09/22 1602) Rubella: 1.05 (09/22 1602) RPR: NON REAC (09/22 1602)  HBsAg: NEGATIVE (09/22 1602)  HIV: NON REACTIVE (09/22 1602)  GBS:     Assessment/Plan: Multipara @ 8363w5d.  H/O C/D x 2--last C/D < 18 mths ago.  False labor. Category I FHT  Admit Supportive measures; repeat C/D for progressive labor    Colleen Oneill,Colleen Oneill 08/23/2013, 5:08 AM

## 2013-08-23 NOTE — Anesthesia Postprocedure Evaluation (Signed)
  Anesthesia Post-op Note  Patient: Colleen Oneill  Procedure(s) Performed: Procedure(s): CESAREAN SECTION (N/A)  Patient is awake, responsive, moving her legs, and has signs of resolution of her numbness. Pain and nausea are reasonably well controlled. Vital signs are stable and clinically acceptable. Oxygen saturation is clinically acceptable. There are no apparent anesthetic complications at this time. Patient is ready for discharge.

## 2013-08-23 NOTE — Progress Notes (Signed)
Colleen Oneill is a 23 y.o. [phone] at 6381w5d by ultrasound, 6 week US admitted for Preterm labor  Called to room to evaluated patient at approx. 0800. On arrival, patient was receiving 10 L of 02. Side lying. IV access. OB US at bedside.   Subjective: Patient shaking, thriving in pain on my arrival. Reports she had a repeat c-section, and planned another repeat c-section. States she has pubic pain 10/10. Denies vaginal LOF or bleeding. Reports some mucus.   Objective: BP 126/61  Pulse 74  Temp(Src) 98.5 F (36.9 C) (Oral)  Resp 18  Ht 5\' 8"  (1.727 m)  Wt 166 lb (75.297 kg)  BMI 25.25 kg/m2  SpO2 99%  LMP 12/09/2012  Breastfeeding? Unknown   Total I/O In: 2800 [I.V.:2800] Out: 1750 [Urine:750; Blood:1000]  FHT:  FHR: 130 bpm, variability: moderate,  accelerations:  Present,  decelerations:  Present present UC:   irritability SVE:   Dilation: 3 Effacement (%): 50 Station: -2 Exam by:: Bedside Ultrasound  Labs: Lab Results  Component Value Date   WBC 16.6* 08/23/2013   HGB 9.0* 08/23/2013   HCT 27.5* 08/23/2013   MCV 82.6 08/23/2013   PLT 261 08/23/2013    Assessment / Plan: Category 3 FHR History of previous c-section x2 Severe Suprapubic Pain Breech presentation Anemia Called MD Clearance CootsHarper, patient to OR for emergent c-section due to NRFHT, labor and previous c-section.  Labor: Preterm labor Preeclampsia:  NA Fetal Wellbeing:  Category III Pain Control:  Anesthesia notified I/D:  n/a Anticipated MOD:  Repeat c-section  [facility] CtrWREN, Johnmark Geiger 08/23/2013, 1:13 PM

## 2013-08-23 NOTE — Brief Op Note (Signed)
08/23/2013  10:38 AM  PATIENT:  Colleen Oneill  23 y.o. female  PRE-OPERATIVE DIAGNOSIS:  non reassuring fetal heart tone  POST-OPERATIVE DIAGNOSIS:  non reassuring fetal heart tone  PROCEDURE:  Procedure(s): CESAREAN SECTION (N/A)  SURGEON:  Surgeon(s) and Role:    * Brock Badharles A Harper, MD - Primary  PHYSICIAN ASSISTANT:  Surgical Technician  ASSISTANTS: Surgical Technician   ANESTHESIA:   spinal  EBL:  Total I/O In: 2650 [I.V.:2650] Out: 1450 [Urine:450; Blood:1000]  BLOOD ADMINISTERED:none  DRAINS: Urinary Catheter (Foley)   LOCAL MEDICATIONS USED:  NONE  SPECIMEN:  Source of Specimen:  Placenta  DISPOSITION OF SPECIMEN:  PATHOLOGY  COUNTS:  YES  TOURNIQUET:  None  DICTATION: .Note written in EPIC  PLAN OF CARE: Admit to inpatient   PATIENT DISPOSITION:  PACU - hemodynamically stable.   Delay start of Pharmacological VTE agent (>24hrs) due to surgical blood loss or risk of bleeding: not applicable

## 2013-08-23 NOTE — Transfer of Care (Signed)
Immediate Anesthesia Transfer of Care Note  Patient: Colleen Oneill  Procedure(s) Performed: Procedure(s): CESAREAN SECTION (N/A)  Patient Location: PACU  Anesthesia Type:Spinal  Level of Consciousness: awake, alert , oriented and patient cooperative  Airway & Oxygen Therapy: Patient Spontanous Breathing  Post-op Assessment: Report given to PACU RN and Post -op Vital signs reviewed and stable  Post vital signs: Reviewed and stable  Complications: No apparent anesthesia complications

## 2013-08-23 NOTE — Lactation Note (Signed)
This note was copied from the chart of Colleen Oneill. Lactation Consultation Note     Initial consult with this mom of a NICU baby, now 9 hours post partum, and 36 5/[redacted] weeks gestation. The baby has a diagnosis of  neonatal depression, apgars 1,2,3, and is on a cooling blanket. This is mom's fourth time providing breast milk for a baby. She is familiar with both pumping and breast feeding. She demonstrated good hand expression technique, and along with pumping, expressed about 4 mls of colostrum.  Teaching done from the NICU booklet on how to provide EBm for a NICU baby. Mom is active with WIC, and knows to call for a DEP. Mom knows to call for questions/concerns.  Patient Name: Colleen Oneill UUVOZ'DToday's Date: 08/23/2013 Reason for consult: Initial assessment;NICU baby;Late preterm infant   Maternal Data Formula Feeding for Exclusion: Yes (baby in the NICU) Infant to breast within first hour of birth: No Breastfeeding delayed due to:: Infant status Has patient been taught Hand Expression?: Yes Does the patient have breastfeeding experience prior to this delivery?: Yes  Feeding    LATCH Score/Interventions                      Lactation Tools Discussed/Used Tools: Pump WIC Program: Yes (mom knows to call for DEP) Pump Review: Setup, frequency, and cleaning;Milk Storage;Other (comment) Initiated by:: clee RN - Mom refused ppumping earlier today, so was started at 9 hours post psrtum Date initiated:: 08/23/13   Consult Status Consult Status: Follow-up Date: 08/24/13 Follow-up type: In-patient    Alfred LevinsLee, Granvil Djordjevic Anne 08/23/2013, 5:44 PM

## 2013-08-23 NOTE — Progress Notes (Signed)
Colleen Oneill is a 23 y.o. [phone] at 7549w5d by LMP admitted for UC's  Subjective:   Objective: BP 122/79  Pulse 88  Temp(Src) 98.3 F (36.8 C) (Oral)  Resp 20  Ht 5\' 8"  (1.727 m)  Wt 167 lb (75.751 kg)  BMI 25.40 kg/m2  SpO2 99%  LMP 12/09/2012  Breastfeeding? Unknown   Total I/O In: 2650 [I.V.:2650] Out: 1450 [Urine:450; Blood:1000]  FHT:  Severe, prolonged decelerations. UC:   irregular SVE:   Dilation: 3 Effacement (%): 50 Station: -2 Exam by:: Bedside Ultrasound  Labs: Lab Results  Component Value Date   WBC 16.6* 08/23/2013   HGB 9.0* 08/23/2013   HCT 27.5* 08/23/2013   MCV 82.6 08/23/2013   PLT 261 08/23/2013    Assessment / Plan: 36 weeks.  Nonreassuring FHR.  Breech presentation.  Will proceed with urgent C/S delivery for Breech presentation in labor with NRFHR with prolonged severe FHR decelerations.  Labor: Latent phase Preeclampsia:  n/a Fetal Wellbeing:  Category III Pain Control:  none I/D:  n/a Anticipated MOD:  C/S  [name] A 08/23/2013, 10:22 AM

## 2013-08-23 NOTE — Op Note (Addendum)
Cesarean Section Procedure Note   Colleen MemoMacy R Vanderschaaf   08/23/2013  Indications: Breech Presentation and Prolonged severe decelerations in labor .  Previous C/S x 2.  Pre-operative Diagnosis: non reassuring fetal heart tone.  Breech presentation in labor.  Post-operative Diagnosis: Same   Surgeon: Murdock Jellison A  Assistants: Surgical Technician  Anesthesia: spinal  Procedure Details:  The patient was seen in the Holding Room. The risks, benefits, complications, treatment options, and expected outcomes were discussed with the patient. The patient concurred with the proposed plan, giving informed consent. The patient was identified as Colleen Oneill and the procedure verified as C-Section Delivery. A Time Out was held and the above information confirmed.  After induction of anesthesia, the patient was draped and prepped in the usual sterile manner. A transverse incision was made and carried down through the subcutaneous tissue to the fascia. The fascial incision was made and extended transversely. The fascia was separated from the underlying rectus tissue superiorly and inferiorly. The peritoneum was identified and entered. The peritoneal incision was extended longitudinally. The utero-vesical peritoneal reflection was incised transversely and the bladder flap was bluntly freed from the lower uterine segment. A low transverse uterine incision was made. Delivered from breech presentation was a 3030 gram living newborn female infant(s). APGAR (1 MIN): 1   APGAR (5 MINS): 2   APGAR (10 MINS): 3    A cord ph was sent. The umbilical cord was clamped and cut cord. A sample was obtained for evaluation. The placenta was removed Intact and appeared normal.  The uterine incision was closed with running locked sutures of 1-0 Monocryl. A second imbricating layer of the same suture was placed.  Hemostasis was observed. The paracolic gutters were irrigated. The parieto peritoneum was closed in a running fashion with  2-0 Vicryl.  The fascia was then reapproximated with running sutures of 0 Vicryl.  The skin was closed with staples.  Instrument, sponge, and needle counts were correct prior the abdominal closure and were correct at the conclusion of the case.    Findings:  Normal uterus, ovaries and tubes.   Estimated Blood Loss: 1000 ml  Total IV Fluids: 2650ml   Urine Output: 450CC OF clear urine  Specimens:  Placenta to Pathology  Complications: no complications  Disposition: PACU - hemodynamically stable.  Maternal Condition: stable   Baby condition / location:  NICU    Signed: Surgeon(s): Brock Badharles A Alainna Stawicki, MD

## 2013-08-23 NOTE — Addendum Note (Signed)
Addendum created 08/23/13 1109 by Cristela BlueKyle Jesstin Studstill, MD   Modules edited: Anesthesia Events

## 2013-08-23 NOTE — Plan of Care (Signed)
Problem: Phase I Progression Outcomes Goal: VS, stable, temp < 100.4 degrees F Outcome: Not Progressing Temp 100.6 oral

## 2013-08-24 ENCOUNTER — Encounter (HOSPITAL_COMMUNITY): Payer: Self-pay | Admitting: Obstetrics

## 2013-08-24 LAB — CBC
HCT: 19.9 % — ABNORMAL LOW (ref 36.0–46.0)
HEMOGLOBIN: 6.5 g/dL — AB (ref 12.0–15.0)
MCH: 27 pg (ref 26.0–34.0)
MCHC: 32.7 g/dL (ref 30.0–36.0)
MCV: 82.6 fL (ref 78.0–100.0)
PLATELETS: 216 10*3/uL (ref 150–400)
RBC: 2.41 MIL/uL — AB (ref 3.87–5.11)
RDW: 14.9 % (ref 11.5–15.5)
WBC: 9.6 10*3/uL (ref 4.0–10.5)

## 2013-08-24 LAB — RPR: RPR: NONREACTIVE

## 2013-08-24 MED ORDER — POLYSACCHARIDE IRON COMPLEX 150 MG PO CAPS
150.0000 mg | ORAL_CAPSULE | Freq: Every day | ORAL | Status: DC
  Administered 2013-08-24 – 2013-08-26 (×3): 150 mg via ORAL
  Filled 2013-08-24 (×4): qty 1

## 2013-08-24 MED ORDER — INFLUENZA VAC SPLIT QUAD 0.5 ML IM SUSP
0.5000 mL | INTRAMUSCULAR | Status: AC
  Administered 2013-08-25: 0.5 mL via INTRAMUSCULAR

## 2013-08-24 NOTE — Progress Notes (Signed)
Subjective: Postpartum Day 1: Cesarean Delivery Patient reports tolerating PO and no problems voiding.    Objective: Vital signs in last 24 hours: Temp:  [98 F (36.7 C)-100.6 F (38.1 C)] 98.4 F (36.9 C) (03/04 1100) Pulse Rate:  [72-90] 72 (03/04 1100) Resp:  [15-20] 20 (03/04 1100) BP: (111-120)/(50-70) 112/65 mmHg (03/04 1100) SpO2:  [97 %-100 %] 100 % (03/04 1100)  Physical Exam:  General: alert and no distress Lochia: appropriate Uterine Fundus: firm Incision: healing well DVT Evaluation: No evidence of DVT seen on physical exam.   Recent Labs  08/23/13 0540 08/24/13 0520  HGB 9.0* 6.5*  HCT 27.5* 19.9*    Assessment/Plan: Status post Cesarean section. Doing well postoperatively.  Anemia.  Clinically stable.  Iron Rx. Continue current care.  Colleen Oneill A 08/24/2013, 3:50 PM

## 2013-08-24 NOTE — Lactation Note (Signed)
This note was copied from the chart of Colleen Wyn ForsterMacy Mcdill. Lactation Consultation Note     Follow up consult with this mom, in NICU at her baby's bedside. She and baby are 24 hours post partum, and the baby is 1236 6/7 weeks corrected gestation. The baby is on a cooling blanket, on room air, stable. Mom is already expressing 20 mls at a time, so I told her to switch to standard setting, and to pump 15-30 minutes, until she stops dripping. [facility] and ask for a DEP. I will folow this family in the nICU  Patient Name: Colleen Oneill ZOXWR'UToday's Date: 08/24/2013 Reason for consult: Follow-up assessment;NICU baby   Maternal Data    Feeding    LATCH Score/Interventions                      Lactation Tools Discussed/Used WIC Program: Yes (mom encouraged to call WIC today for a DEP)   Consult Status Consult Status: Follow-up Date: 08/25/13 Follow-up type: In-patient    Alfred LevinsLee, Sayuri Rhames Anne 08/24/2013, 11:32 AM

## 2013-08-24 NOTE — Addendum Note (Signed)
Addendum created 08/24/13 [id number] by Graciela HusbandsWynn O Xia Stohr, CRNA   Modules edited: Notes Section   Notes Section:  File: [id number]

## 2013-08-24 NOTE — Progress Notes (Signed)
Results for Colleen Oneill, Colleen Oneill (MRN [id number]) as of 08/24/2013 05:41  Ref. Range 08/24/2013 05:20  WBC Latest Range: 4.0-10.5 K/uL 9.6  RBC Latest Range: 3.87-5.11 MIL/uL 2.41 (L)  Hemoglobin Latest Range: 12.0-15.0 g/dL 6.5 (LL)  HCT Latest Range: 36.0-46.0 % 19.9 (L)  MCV Latest Range: 78.0-100.0 fL 82.6  MCH Latest Range: 26.0-34.0 pg 27.0  MCHC Latest Range: 30.0-36.0 g/dL 40.932.7  RDW Latest Range: 11.5-15.5 % 14.9  Platelets Latest Range: 150-400 K/uL 216   Phone report to Dr. Clearance CootsHarper - no orders rec'd.

## 2013-08-24 NOTE — Anesthesia Postprocedure Evaluation (Signed)
Anesthesia Post Note  Patient: Colleen Oneill  Procedure(s) Performed: Procedure(s) (LRB): CESAREAN SECTION (N/A)  Anesthesia type: Spinal  Patient location: Mother/Baby  Post pain: Pain level controlled  Post assessment: Post-op Vital signs reviewed  Last Vitals:  Filed Vitals:   08/24/13 0523  BP: 111/59  Pulse: 79  Temp: 36.7 C  Resp: 15    Post vital signs: Reviewed  Level of consciousness: awake  Complications: No apparent anesthesia complications

## 2013-08-25 NOTE — Progress Notes (Signed)
Subjective: Postpartum Day 2: Cesarean Delivery Patient reports tolerating PO, + flatus and no problems voiding.    Objective: Vital signs in last 24 hours: Temp:  [98 F (36.7 C)-98.8 F (37.1 C)] 98 F (36.7 C) (03/05 0604) Pulse Rate:  [72-90] 82 (03/05 0604) Resp:  [16-20] 16 (03/05 0604) BP: (106-129)/(58-77) 129/68 mmHg (03/05 0604) SpO2:  [100 %] 100 % (03/05 0604)  Physical Exam:  General: alert and no distress Lochia: appropriate Uterine Fundus: firm Incision: healing well DVT Evaluation: No evidence of DVT seen on physical exam.   Recent Labs  08/23/13 0540 08/24/13 0520  HGB 9.0* 6.5*  HCT 27.5* 19.9*    Assessment/Plan: Status post Cesarean section. Doing well postoperatively.   Anemia.  Clinically stable. Continue current care.  HARPER,CHARLES A 08/25/2013, 9:01 AM

## 2013-08-26 MED ORDER — POLYSACCHARIDE IRON COMPLEX 150 MG PO CAPS: 150.0000 mg | ORAL_CAPSULE | Freq: Every day | ORAL | Status: DC

## 2013-08-26 MED ORDER — DIBUCAINE 1 % RE OINT: 1.0000 "application " | TOPICAL_OINTMENT | RECTAL | Status: DC | PRN

## 2013-08-26 MED ORDER — IBUPROFEN 100 MG/5ML PO SUSP: 600.0000 mg | Freq: Four times a day (QID) | ORAL | Status: DC

## 2013-08-26 MED ORDER — OXYCODONE-ACETAMINOPHEN 5-325 MG PO TABS: 1.0000 | ORAL_TABLET | ORAL | Status: DC | PRN

## 2013-08-26 NOTE — Progress Notes (Signed)
Pt discharged to home with mother.  Condition stable.  Pt planning to visit baby in NICU and leave hospital from there.  [facility]  No equipment for home ordered at discharge.

## 2013-08-26 NOTE — Lactation Note (Signed)
This note was copied from the chart of Colleen Wyn ForsterMacy Sandberg. Lactation Consultation Note    Follow up consult with this mom of a NICU baby, now 76 hours ost partum, and 37 1/7 weeks corrected gestation. Colleen Oneill is doing well, and is being warmed up today, to wean her off her cooling blanket. Mom is being discharged to home. [facility] Discharge teaching on pumping reviewed with mom. I will follow this family in the nICU.  Patient Name: Colleen Oneill GMWNU'UToday's Date: 08/26/2013 Reason for consult: Follow-up assessment;NICU baby;Late preterm infant   Maternal Data    Feeding    LATCH Score/Interventions                      Lactation Tools Discussed/Used Tools: Pump Breast pump type: Manual WIC Program: Yes (mom has a 3;30 appointment to get a DEP, she is being discharged today) Pump Review: Setup, frequency, and cleaning;Milk Storage;Other (comment) (encouraged to increase her frequency to 8 times a day)   Consult Status Consult Status: Follow-up Follow-up type:  (prn in NICU)    Alfred LevinsLee, Danyel Tobey Anne 08/26/2013, 12:41 PM

## 2013-08-26 NOTE — Discharge Summary (Signed)
Obstetric Discharge Summary Reason for Admission: onset of labor and Preterm labor Prenatal Procedures: ultrasound Intrapartum Procedures: cesarean: low cervical, transverse, NRFHT Postpartum Procedures: none Complications-Operative and Postpartum: none Hemoglobin  Date Value Ref Range Status  08/24/2013 6.5* 12.0 - 15.0 g/dL Final     REPEATED TO VERIFY     DELTA CHECK NOTED     CRITICAL RESULT CALLED TO, READ BACK BY AND VERIFIED WITH:     SPOKE TO KEYSB @ 0536 ON [id number] BY BOSTONC     HCT  Date Value Ref Range Status  08/24/2013 19.9* 36.0 - 46.0 % Final    Physical Exam:  General: alert and cooperative Lochia: appropriate Uterine Fundus: firm Incision: healing well, no significant drainage, no dehiscence, no significant erythema DVT Evaluation: No evidence of DVT seen on physical exam.  Discharge Diagnoses: Premature labor and possible abruption, emergency c-section  Discharge Information: Date: 08/26/2013 Activity: pelvic rest Diet: routine Medications: PNV, Ibuprofen and Percocet Condition: stable Instructions: refer to practice specific booklet Discharge to: home   Newborn Data: Live born female  Birth Weight: 6 lb 10.9 oz (3031 g) APGAR: 1, 2  Baby  remains in NICU.  WREN, AMY 08/26/2013, 9:28 AM

## 2013-08-28 ENCOUNTER — Other Ambulatory Visit: Payer: Self-pay | Admitting: Obstetrics

## 2013-09-20 ENCOUNTER — Ambulatory Visit: Payer: Medicaid Other | Admitting: Obstetrics

## 2014-04-24 ENCOUNTER — Encounter (HOSPITAL_COMMUNITY): Payer: Self-pay | Admitting: Obstetrics

## 2015-09-21 ENCOUNTER — Emergency Department (HOSPITAL_COMMUNITY)
Admission: EM | Admit: 2015-09-21 | Discharge: 2015-09-22 | Disposition: A | Discharge status: Home / Self Care | Payer: Medicaid Other | Attending: Emergency Medicine | Admitting: Emergency Medicine

## 2015-09-21 ENCOUNTER — Encounter (HOSPITAL_COMMUNITY): Payer: Self-pay | Admitting: Emergency Medicine

## 2015-09-21 DIAGNOSIS — N12 Tubulo-interstitial nephritis, not specified as acute or chronic: Secondary | ICD-10-CM | POA: Insufficient documentation

## 2015-09-21 DIAGNOSIS — Z79899 Other long term (current) drug therapy: Secondary | ICD-10-CM | POA: Diagnosis not present

## 2015-09-21 DIAGNOSIS — Z3202 Encounter for pregnancy test, result negative: Secondary | ICD-10-CM | POA: Insufficient documentation

## 2015-09-21 DIAGNOSIS — R319 Hematuria, unspecified: Secondary | ICD-10-CM | POA: Diagnosis present

## 2015-09-21 LAB — POC URINE PREG, ED: PREG TEST UR: NEGATIVE

## 2015-09-21 LAB — URINALYSIS, ROUTINE W REFLEX MICROSCOPIC
Bilirubin Urine: NEGATIVE
GLUCOSE, UA: NEGATIVE mg/dL
Ketones, ur: 15 mg/dL — AB
Nitrite: POSITIVE — AB
PROTEIN: 100 mg/dL — AB
Specific Gravity, Urine: 1.016 (ref 1.005–1.030)
pH: 5.5 (ref 5.0–8.0)

## 2015-09-21 LAB — CBC
HCT: 37.2 % (ref 36.0–46.0)
Hemoglobin: 13 g/dL (ref 12.0–15.0)
MCH: 32.2 pg (ref 26.0–34.0)
MCHC: 34.9 g/dL (ref 30.0–36.0)
MCV: 92.1 fL (ref 78.0–100.0)
PLATELETS: 255 10*3/uL (ref 150–400)
RBC: 4.04 MIL/uL (ref 3.87–5.11)
RDW: 12.6 % (ref 11.5–15.5)
WBC: 13.4 10*3/uL — AB (ref 4.0–10.5)

## 2015-09-21 LAB — COMPREHENSIVE METABOLIC PANEL
ALT: 9 U/L — AB (ref 14–54)
AST: 15 U/L (ref 15–41)
Albumin: 4 g/dL (ref 3.5–5.0)
Alkaline Phosphatase: 38 U/L (ref 38–126)
Anion gap: 12 (ref 5–15)
BUN: 16 mg/dL (ref 6–20)
CHLORIDE: 104 mmol/L (ref 101–111)
CO2: 22 mmol/L (ref 22–32)
CREATININE: 0.92 mg/dL (ref 0.44–1.00)
Calcium: 9.5 mg/dL (ref 8.9–10.3)
GFR calc Af Amer: >60 mL/min (ref >60)
Glucose, Bld: 121 mg/dL — ABNORMAL HIGH (ref 65–99)
POTASSIUM: 3.8 mmol/L (ref 3.5–5.1)
Sodium: 138 mmol/L (ref 135–145)
Total Bilirubin: 1 mg/dL (ref 0.3–1.2)
Total Protein: 7.6 g/dL (ref 6.5–8.1)

## 2015-09-21 LAB — URINE MICROSCOPIC-ADD ON

## 2015-09-21 MED ORDER — IBUPROFEN 200 MG PO TABS
ORAL_TABLET | ORAL | Status: AC
  Administered 2015-09-21: 600 mg
  Filled 2015-09-21: qty 3

## 2015-09-21 MED ORDER — IBUPROFEN 400 MG PO TABS
600.0000 mg | ORAL_TABLET | Freq: Once | ORAL | Status: AC
  Administered 2015-09-21: 600 mg via ORAL

## 2015-09-21 NOTE — ED Notes (Signed)
Pt. reports bilateral flank pain with hematuria , fever and nausea onset this week .

## 2015-09-22 MED ORDER — KETOROLAC TROMETHAMINE 30 MG/ML IJ SOLN
15.0000 mg | Freq: Once | INTRAMUSCULAR | Status: AC
  Administered 2015-09-22: 15 mg via INTRAVENOUS
  Filled 2015-09-22: qty 1

## 2015-09-22 MED ORDER — CEPHALEXIN 500 MG PO CAPS: 500.0000 mg | ORAL_CAPSULE | Freq: Four times a day (QID) | ORAL | Status: DC

## 2015-09-22 MED ORDER — DEXTROSE 5 % IV SOLN
1.0000 g | Freq: Once | INTRAVENOUS | Status: AC
  Administered 2015-09-22: 1 g via INTRAVENOUS
  Filled 2015-09-22: qty 10

## 2015-09-22 MED ORDER — SODIUM CHLORIDE 0.9 % IV BOLUS (SEPSIS)
1000.0000 mL | Freq: Once | INTRAVENOUS | Status: AC
  Administered 2015-09-22: 1000 mL via INTRAVENOUS

## 2015-09-22 NOTE — ED Provider Notes (Signed)
CSN: [id number]     Arrival date & time 09/21/15  2100 History  By signing my name below, I, Evon Slack, attest that this documentation has been prepared under the direction and in the presence of Zadie Rhine, MD. Electronically Signed: Evon Slack, ED Scribe. 09/22/2015. 12:31 AM.    Chief Complaint  Patient presents with  . Flank Pain  . Hematuria   Patient is a 25 y.o. female presenting with flank pain and hematuria. The history is provided by the patient. No language interpreter was used.  Flank Pain Associated symptoms include abdominal pain and headaches.  Hematuria Associated symptoms include abdominal pain and headaches.   HPI Comments: Colleen Oneill is a 25 y.o. female who presents to the Emergency Department complaining of bilateral flank pain onset 3 days prior. Pt reports associated dysuria, urinary frequency, HA, fever and vomiting. Pt states that the pain does radiate around to her lower abdomen. Pt states her symptoms feel like previous UTI. Denies cough or diarrhea. No vaginal bleeding or discharge reported She reports she is worsening  Past Medical History  Diagnosis Date  . Medical history non-contributory    Past Surgical History  Procedure Laterality Date  . Cesarean section    . Cesarean section N/A 08/09/2012    Procedure: Repeat CESAREAN SECTION of baby boy  at 0431  APGAR 9/9;  Surgeon: Kathreen Cosier, MD;  Location: [facility];  Service: Obstetrics;  Laterality: N/A;  . Cesarean section N/A 08/23/2013    Procedure: CESAREAN SECTION;  Surgeon: Brock Bad, MD;  Location: [facility];  Service: Obstetrics;  Laterality: N/A;   Family History  Problem Relation Age of Onset  . Other Neg Hx   . Kidney disease Father   . Diabetes Maternal Grandmother    Social History  Substance Use Topics  . Smoking status: Never Smoker   . Smokeless tobacco: Never Used  . Alcohol Use: No   OB History    Gravida Para Term Preterm AB TAB SAB Ectopic Multiple  Living   Review of Systems  Respiratory: Negative for cough.   Gastrointestinal: Positive for vomiting and abdominal pain. Negative for diarrhea.  Genitourinary: Positive for dysuria, frequency and flank pain.  Neurological: Positive for headaches.  All other systems reviewed and are negative.     Allergies  Review of patient's allergies indicates no known allergies.  Home Medications   Prior to Admission medications   Medication Sig Start Date End Date Taking? Authorizing Provider  cholecalciferol (VITAMIN D) 1000 UNITS tablet Take 1 tablet (1,000 Units total) by mouth daily. 03/18/13   Brock Bad, MD  dibucaine (NUPERCAINAL) 1 % OINT Place 1 application rectally as needed for hemorrhoids (twice daily). 08/26/13   Amy H Wren, CNM  ibuprofen (ADVIL,MOTRIN) 100 MG/5ML suspension Take 30 mLs (600 mg total) by mouth every 6 (six) hours. 08/26/13   Amy Tillie Rung, CNM  iron polysaccharides (NIFEREX) 150 MG capsule Take 1 capsule (150 mg total) by mouth daily. 08/26/13   Amy Tillie Rung, CNM  oxyCODONE-acetaminophen (PERCOCET/ROXICET) 5-325 MG per tablet Take 1-2 tablets by mouth every 4 (four) hours as needed for severe pain (moderate - severe pain). 08/26/13   Amy Tillie Rung, CNM  Prenatal Vit-Fe Fumarate-FA (PRENATAL MULTIVITAMIN) TABS tablet Take 1 tablet by mouth daily at 12 noon.    Historical Provider, MD   BP 112/70 mmHg  Pulse 85  Temp(Src)  100.7 F (38.2 C) (Oral)  Resp 18  SpO2 98%  LMP 09/04/2015   Physical Exam  CONSTITUTIONAL: Well developed/well nourished HEAD: Normocephalic/atraumatic EYES: EOMI/PERRL ENMT: Mucous membranes moist NECK: supple no meningeal signs SPINE/BACK:entire spine nontender CV: S1/S2 noted, no murmurs/rubs/gallops noted LUNGS: Lungs are clear to auscultation bilaterally, no apparent distress ABDOMEN: soft, nontender, no rebound or guarding, bowel sounds noted throughout abdomen GU: bilateral cva tenderness NEURO: Pt is  awake/alert/appropriate, moves all extremitiesx4.  No facial droop.   EXTREMITIES: pulses normal/equal, full ROM SKIN: warm, color normal PSYCH: no abnormalities of mood noted, alert and oriented to situation  ED Course  Procedures   Medications  ibuprofen (ADVIL,MOTRIN) tablet 600 mg (600 mg Oral Given 09/21/15 2142)  ibuprofen (ADVIL,MOTRIN) 200 MG tablet (600 mg  Given 09/21/15 2142)  cefTRIAXone (ROCEPHIN) 1 g in dextrose 5 % 50 mL IVPB (0 g Intravenous Stopped 09/22/15 0136)  sodium chloride 0.9 % bolus 1,000 mL (0 mLs Intravenous Stopped 09/22/15 0151)  ketorolac (TORADOL) 30 MG/ML injection 15 mg (15 mg Intravenous Given 09/22/15 0151)    DIAGNOSTIC STUDIES: Oxygen Saturation is 98% on RA, normal by my interpretation.    COORDINATION OF CARE: 12:46 AM-Discussed treatment plan with pt at bedside and pt agreed to plan.   Pt well appearing Likely pyelo She is nontoxic She is in no distress BP is ranging from mid 90s-100s I doubt sepsis at this time No vomiting noted Will d/c home Urine culture pending Will start on keflex We discussed strict return precautions   Labs Review Labs Reviewed  COMPREHENSIVE METABOLIC PANEL - Abnormal; Notable for the following:    Glucose, Bld 121 (*)    ALT 9 (*)    All other components within normal limits  CBC - Abnormal; Notable for the following:    WBC 13.4 (*)    All other components within normal limits  URINALYSIS, ROUTINE W REFLEX MICROSCOPIC (NOT AT [facility]) - Abnormal; Notable for the following:    Color, Urine AMBER (*)    APPearance TURBID (*)    Hgb urine dipstick LARGE (*)    Ketones, ur 15 (*)    Protein, ur 100 (*)    Nitrite POSITIVE (*)    Leukocytes, UA LARGE (*)    All other components within normal limits  URINE MICROSCOPIC-ADD ON - Abnormal; Notable for the following:    Squamous Epithelial / LPF 6-30 (*)    Bacteria, UA MANY (*)    All other components within normal limits  POC URINE PREG, ED    I have  personally reviewed and evaluated these lab results as part of my medical decision-making.    MDM   Final diagnoses:  Pyelonephritis      Nursing notes including past medical history and social history reviewed and considered in documentation Labs/vital reviewed myself and considered during evaluation   I personally performed the services described in this documentation, which was scribed in my presence. The recorded information has been reviewed and is accurate.        Zadie Rhineonald Rumaysa Sabatino, MD 09/22/15 [phone]

## 2015-09-22 NOTE — Discharge Instructions (Signed)

## 2015-09-22 NOTE — ED Notes (Signed)
Pt stable, ambulatory, states understanding of discharge instructions 

## 2015-09-24 LAB — URINE CULTURE: Culture: >=100000

## 2015-09-25 ENCOUNTER — Telehealth: Payer: Self-pay | Admitting: *Deleted

## 2015-09-25 NOTE — ED Notes (Signed)
Post ED Visit - Positive Culture Follow-up  Culture report reviewed by antimicrobial stewardship pharmacist:  []  Enzo BiNathan Batchelder, Pharm.D. []  Celedonio MiyamotoJeremy Frens, Pharm.D., BCPS []  Garvin FilaMike Maccia, Pharm.D. []  Georgina PillionElizabeth Martin, Pharm.D., BCPS []  Talking RockMinh Pham, [address] BoulevardPharm.D., BCPS, AAHIVP []  Estella HuskMichelle Turner, Pharm.D., BCPS, AAHIVP []  Tennis Mustassie Stewart, Pharm.D. []  Sherle Poeob Vincent, [place]arm[place]D. Gennaro AfricaMegan Mills, Pharm D  Positive urine culture Treated with cephalexin, organism sensitive to the same and no further patient follow-up is required at this time.  Virl AxeRobertson, Tritia Endo Talley 09/25/2015, 10:14 AM

## 2018-06-29 ENCOUNTER — Encounter (HOSPITAL_COMMUNITY): Payer: Self-pay | Admitting: *Deleted

## 2018-06-29 ENCOUNTER — Emergency Department (HOSPITAL_COMMUNITY): Payer: Self-pay

## 2018-06-29 ENCOUNTER — Emergency Department (HOSPITAL_COMMUNITY)
Admission: EM | Admit: 2018-06-29 | Discharge: 2018-06-29 | Disposition: A | Discharge status: Home / Self Care | Payer: Self-pay | Attending: Emergency Medicine | Admitting: Emergency Medicine

## 2018-06-29 DIAGNOSIS — H9201 Otalgia, right ear: Secondary | ICD-10-CM | POA: Insufficient documentation

## 2018-06-29 DIAGNOSIS — J069 Acute upper respiratory infection, unspecified: Secondary | ICD-10-CM | POA: Insufficient documentation

## 2018-06-29 DIAGNOSIS — B9789 Other viral agents as the cause of diseases classified elsewhere: Secondary | ICD-10-CM

## 2018-06-29 MED ORDER — BENZONATATE 100 MG PO CAPS: 100.0000 mg | ORAL_CAPSULE | Freq: Three times a day (TID) | ORAL | 0 refills | Status: DC | PRN

## 2018-06-29 NOTE — Discharge Instructions (Signed)
Please read instructions below.  You can take tylenol or ibuprofen as needed for sore throat, earache or fever.  Drink plenty of water.  Use saline nasal spray for congestion. Follow up with your primary care provider as needed.  Return to the ER for inability to swallow liquids, difficulty breathing, or new or worsening symptoms.

## 2018-06-29 NOTE — ED Provider Notes (Signed)
[facility]-EMERGENCY DEPT Provider Note   CSN: [id number] Arrival date & time: 06/29/18  1437     History   Chief Complaint Chief Complaint  Patient presents with  . Otalgia    HPI Colleen Oneill is a 28 y.o. female without significant past medical history, presenting to the emergency department with complaint of right-sided otalgia that began a couple of days ago.  Patient states she has had an upper respiratory infection initially began around Christmas time with a runny nose and cough then followed by a sore throat with cough productive of green phlegm.  She has been treating symptoms with Tylenol and over-the-counter medications.  A few days ago she developed right ear pain, treated with Tylenol.  Denies hearing loss, fever, difficulty breathing or swallowing, or other complaints. The history is provided by the patient.    Past Medical History:  Diagnosis Date  . Medical history non-contributory     Patient Active Problem List   Diagnosis Date Noted  . Premature uterine contractions 08/23/2013  . Fetal heart rate decelerations, delivered 08/23/2013  . Nausea and vomiting in pregnancy prior to [redacted] weeks gestation 03/15/2013    Past Surgical History:  Procedure Laterality Date  . CESAREAN SECTION    . CESAREAN SECTION N/A 08/09/2012   Procedure: Repeat CESAREAN SECTION of baby boy  at 0431  APGAR 9/9;  Surgeon: Kathreen Cosier, MD;  Location: [facility];  Service: Obstetrics;  Laterality: N/A;  . CESAREAN SECTION N/A 08/23/2013   Procedure: CESAREAN SECTION;  Surgeon: Brock Bad, MD;  Location: [facility];  Service: Obstetrics;  Laterality: N/A;     OB History    Gravida  4   Para  4   Term  3   Preterm  1   AB      Living  4     SAB      TAB      Ectopic      Multiple      Live Births  4            Home Medications    Prior to Admission medications   Medication Sig Start Date End Date Taking? Authorizing Provider    benzonatate (TESSALON) 100 MG capsule Take 1 capsule (100 mg total) by mouth 3 (three) times daily as needed for cough. 06/29/18   Mikah Rottinghaus, [place] N, PA-C  cephALEXin (KEFLEX) 500 MG capsule Take 1 capsule (500 mg total) by mouth 4 (four) times daily. 09/22/15   Zadie Rhine, MD    Family History Family History  Problem Relation Age of Onset  . Kidney disease Father   . Diabetes Maternal Grandmother   . Other Neg Hx     Social History Social History   Tobacco Use  . Smoking status: Never Smoker  . Smokeless tobacco: Never Used  Substance Use Topics  . Alcohol use: No  . Drug use: No     Allergies   Patient has no known allergies.   Review of Systems Review of Systems  Constitutional: Negative for fever.  HENT: Positive for congestion, ear pain, rhinorrhea and sore throat. Negative for ear discharge, hearing loss, trouble swallowing and voice change.   Respiratory: Positive for cough. Negative for shortness of breath.      Physical Exam Updated Vital Signs BP 116/69 (BP Location: Right Arm)   Pulse 68   Temp 98.5 F (36.9 C) (Oral)   Resp 18   LMP 05/29/2018  SpO2 98%   Physical Exam Vitals signs and nursing note reviewed.  Constitutional:      General: She is not in acute distress.    Appearance: She is well-developed. She is not ill-appearing.  HENT:     Head: Normocephalic and atraumatic.     Right Ear: Tympanic membrane, ear canal and external ear normal. No mastoid tenderness.     Left Ear: Tympanic membrane, ear canal and external ear normal. No mastoid tenderness.     Mouth/Throat:     Mouth: Mucous membranes are moist.     Pharynx: Oropharynx is clear.  Eyes:     Conjunctiva/sclera: Conjunctivae normal.  Neck:     Musculoskeletal: Normal range of motion and neck supple.  Cardiovascular:     Rate and Rhythm: Normal rate and regular rhythm.  Pulmonary:     Effort: Pulmonary effort is normal.     Breath sounds: Normal breath sounds.   Lymphadenopathy:     Cervical: No cervical adenopathy.  Neurological:     Mental Status: She is alert.  Psychiatric:        Mood and Affect: Mood normal.        Behavior: Behavior normal.      ED Treatments / Results  Labs (all labs ordered are listed, but only abnormal results are displayed) Labs Reviewed - No data to display  EKG None  Radiology Dg Chest 2 View  Result Date: 06/29/2018 CLINICAL DATA:  Productive cough EXAM: CHEST - 2 VIEW COMPARISON:  12/12/2007 FINDINGS: The heart size and mediastinal contours are within normal limits. Both lungs are clear. The visualized skeletal structures are unremarkable. IMPRESSION: No active cardiopulmonary disease. Electronically Signed   By: Jasmine PangKim  Fujinaga M.D.   On: 06/29/2018 16:09    Procedures Procedures (including critical care time)  Medications Ordered in ED Medications - No data to display   Initial Impression / Assessment and Plan / ED Course  I have reviewed the triage vital signs and the nursing notes.  Pertinent labs & imaging results that were available during my care of the patient were reviewed by me and considered in my medical decision making (see chart for details).     Patients symptoms are consistent with URI, likely viral etiology. Afebrile, tolerating secretions. TMs normal bilaterally. Lungs clear to auscultation bilaterally. CXR negative for acute infiltrate.  Discussed that antibiotics are not indicated for viral infections. Pt will be discharged with symptomatic treatment.  Verbalizes understanding and is agreeable with plan. Pt is hemodynamically stable & in NAD prior to dc.  Discussed results, findings, treatment and follow up. Patient advised of return precautions. Patient verbalized understanding and agreed with plan.  Final Clinical Impressions(s) / ED Diagnoses   Final diagnoses:  Right ear pain  Viral URI with cough    ED Discharge Orders         Ordered    benzonatate (TESSALON) 100 MG  capsule  3 times daily PRN     06/29/18 1632           Jackson Fetters, [place] N, PA-C 06/29/18 [id number]    Gwyneth SproutPlunkett, Whitney, MD 06/29/18 1643

## 2018-06-29 NOTE — ED Triage Notes (Signed)
Pt complains of right ear pain for the past 2 weeks. Pt states he has had an URI recently.

## 2020-01-26 ENCOUNTER — Emergency Department (HOSPITAL_COMMUNITY)
Admission: EM | Admit: 2020-01-26 | Discharge: 2020-01-26 | Disposition: A | Discharge status: Home / Self Care | Payer: Self-pay | Attending: Emergency Medicine | Admitting: Emergency Medicine

## 2020-01-26 ENCOUNTER — Other Ambulatory Visit: Payer: Self-pay

## 2020-01-26 ENCOUNTER — Encounter (HOSPITAL_COMMUNITY): Payer: Self-pay | Admitting: Emergency Medicine

## 2020-01-26 DIAGNOSIS — M545 Low back pain: Secondary | ICD-10-CM | POA: Insufficient documentation

## 2020-01-26 DIAGNOSIS — Z5321 Procedure and treatment not carried out due to patient leaving prior to being seen by health care provider: Secondary | ICD-10-CM | POA: Insufficient documentation

## 2020-01-26 NOTE — ED Triage Notes (Signed)
Patient is complaining of lower back. Patient states that it hurts when she stand up and when she sits. Patient states that it started today.

## 2021-02-11 ENCOUNTER — Other Ambulatory Visit: Payer: Self-pay

## 2021-02-11 ENCOUNTER — Emergency Department (HOSPITAL_COMMUNITY)
Admission: EM | Admit: 2021-02-11 | Discharge: 2021-02-11 | Disposition: A | Discharge status: Home / Self Care | Payer: Self-pay | Attending: Emergency Medicine | Admitting: Emergency Medicine

## 2021-02-11 ENCOUNTER — Encounter (HOSPITAL_COMMUNITY): Payer: Self-pay

## 2021-02-11 ENCOUNTER — Emergency Department (HOSPITAL_COMMUNITY): Payer: Self-pay

## 2021-02-11 DIAGNOSIS — U071 COVID-19: Secondary | ICD-10-CM | POA: Insufficient documentation

## 2021-02-11 DIAGNOSIS — R52 Pain, unspecified: Secondary | ICD-10-CM

## 2021-02-11 LAB — RESP PANEL BY RT-PCR (FLU A&B, COVID) ARPGX2
Influenza A by PCR: NEGATIVE
Influenza B by PCR: NEGATIVE
SARS Coronavirus 2 by RT PCR: POSITIVE — AB

## 2021-02-11 MED ORDER — ONDANSETRON 4 MG PO TBDP
4.0000 mg | ORAL_TABLET | Freq: Once | ORAL | Status: AC
  Administered 2021-02-11: 4 mg via ORAL
  Filled 2021-02-11: qty 1

## 2021-02-11 MED ORDER — ONDANSETRON 4 MG PO TBDP: 4.0000 mg | ORAL_TABLET | Freq: Three times a day (TID) | ORAL | 0 refills | Status: DC | PRN

## 2021-02-11 MED ORDER — ACETAMINOPHEN 325 MG PO TABS
650.0000 mg | ORAL_TABLET | Freq: Once | ORAL | Status: AC
  Administered 2021-02-11: 650 mg via ORAL
  Filled 2021-02-11: qty 2

## 2021-02-11 NOTE — ED Triage Notes (Signed)
Pt complains of body aches, headache, and cough since Friday.

## 2021-02-11 NOTE — Discharge Instructions (Addendum)
Your COVID test is positive.  Please follow CDC guidelines and isolate, they are attached.  Continue to wear a mask for the full 10 days.  Please follow-up with your primary care doctor in the next couple of days for symptom check, if you not a primary care doctor you can also be seen in urgent care or come back to the emergency department.  If you develop any shortness of breath, difficulty breathing or chest pain please come back to the ER.  Please take Tylenol as prescribed on the bottle and I also prescribed you Zofran for your nausea.  Make sure to stay hydrated, this is extremely important.

## 2021-02-11 NOTE — ED Provider Notes (Signed)
Emergency Medicine Provider Triage Evaluation Note  Colleen Oneill , a 30 y.o. female  was evaluated in triage.  Pt complains of chills, body aches, headache, cough, sore throat and vomiting.  Symptoms started Thursday and have been persistent and not improving.  She has had chills at home but has not measured her fever.  Has not been able to keep anything down.  Unsure of sick contacts.  Review of Systems  Positive: Chills, body aches, headache, sore throat, cough, vomiting Negative: Chest pain, abdominal pain, shortness of breath  Physical Exam  BP 112/87 (BP Location: Left Arm)   Pulse 64   Temp 99.4 F (37.4 C) (Oral)   Resp 15   Ht 5\' 8"  (1.727 m)   Wt 57.6 kg   SpO2 99%   BMI 19.31 kg/m  Gen:   Awake, no distress   Resp:  Normal effort  MSK:   Moves extremities without difficulty  Other:    Medical Decision Making  Medically screening exam initiated at 8:19 PM.  Appropriate orders placed.  ANDJELA WICKES was informed that the remainder of the evaluation will be completed by another provider, this initial triage assessment does not replace that evaluation, and the importance of remaining in the ED until their evaluation is complete.     Derrill Memo 02/11/21 2026    2027, MD 02/11/21 [phone]

## 2021-02-11 NOTE — ED Provider Notes (Signed)
[facility]-EMERGENCY DEPT Provider Note   CSN: [id number] Arrival date & time: 02/11/21  1919     History Chief Complaint  Patient presents with   Generalized Body Aches   Headache   Cough    [facility]ill is a 30 y.o. female with no pertinent past medical history the presents the emerge department today for URI symptoms.  Patient states that on Thursday/Friday she started developing generalized body aches, headache, cough, nausea, sore throat,vomiting.  Patient states that its been persistent since then, has been having a fever and has been intermittent.  States that she had a T-max at home of 102.  Did take ibuprofen yesterday, did not take any medications today, temperature today 98.4.  Patient denies any sick contacts.  Has not been vaccinated against COVID.  Denies any shortness of breath or chest pain.  No other complaints at this time.Marland Kitchen  HPI     Past Medical History:  Diagnosis Date   Medical history non-contributory     Patient Active Problem List   Diagnosis Date Noted   Premature uterine contractions 08/23/2013   Fetal heart rate decelerations, delivered 08/23/2013   Nausea and vomiting in pregnancy prior to [redacted] weeks gestation 03/15/2013    Past Surgical History:  Procedure Laterality Date   CESAREAN SECTION     CESAREAN SECTION N/A 08/09/2012   Procedure: Repeat CESAREAN SECTION of baby boy  at 0431  APGAR 9/9;  Surgeon: Kathreen Cosier, MD;  Location: [facility];  Service: Obstetrics;  Laterality: N/A;   CESAREAN SECTION N/A 08/23/2013   Procedure: CESAREAN SECTION;  Surgeon: Brock Bad, MD;  Location: [facility];  Service: Obstetrics;  Laterality: N/A;     OB History     Gravida  4   Para  4   Term  3   Preterm  1   AB      Living  4      SAB      IAB      Ectopic      Multiple      Live Births  4           Family History  Problem Relation Age of Onset   Kidney disease Father    Diabetes Maternal Grandmother     Other Neg Hx     Social History   Tobacco Use   Smoking status: Never   Smokeless tobacco: Never  Substance Use Topics   Alcohol use: No   Drug use: No    Home Medications Prior to Admission medications   Medication Sig Start Date End Date Taking? Authorizing Provider  ondansetron (ZOFRAN ODT) 4 MG disintegrating tablet Take 1 tablet (4 mg total) by mouth every 8 (eight) hours as needed for nausea or vomiting. 02/11/21  Yes Farrel Gordon, PA-C  benzonatate (TESSALON) 100 MG capsule Take 1 capsule (100 mg total) by mouth 3 (three) times daily as needed for cough. 06/29/18   Robinson, [place] N, PA-C  cephALEXin (KEFLEX) 500 MG capsule Take 1 capsule (500 mg total) by mouth 4 (four) times daily. 09/22/15   Zadie Rhine, MD    Allergies    Patient has no known allergies.  Review of Systems   Review of Systems  Constitutional:  Positive for fatigue and fever. Negative for chills and diaphoresis.  HENT:  Positive for sore throat. Negative for congestion, ear discharge, mouth sores, nosebleeds, postnasal drip, sinus pressure, sinus pain and trouble swallowing.   Eyes:  Negative for pain and visual disturbance.  Respiratory:  Positive for cough. Negative for shortness of breath and wheezing.   Cardiovascular:  Negative for chest pain, palpitations and leg swelling.  Gastrointestinal:  Positive for nausea and vomiting. Negative for abdominal distention, abdominal pain and diarrhea.  Genitourinary:  Negative for difficulty urinating.  Musculoskeletal:  Positive for arthralgias. Negative for back pain, neck pain and neck stiffness.  Skin:  Negative for pallor.  Neurological:  Positive for headaches. Negative for dizziness, speech difficulty and weakness.  Psychiatric/Behavioral:  Negative for confusion.    Physical Exam Updated Vital Signs BP 112/87 (BP Location: Left Arm)   Pulse 64   Temp 99.4 F (37.4 C) (Oral)   Resp 15   Ht 5\' 8"  (1.727 m)   Wt 57.6 kg   LMP 02/05/2021    SpO2 99%   BMI 19.31 kg/m   Physical Exam Constitutional:      General: She is not in acute distress.    Appearance: Normal appearance. She is not ill-appearing, toxic-appearing or diaphoretic.     Comments: Patient without acute respiratory stress.  Patient is sitting comfortably in bed, no tripoding, use of accessory muscles.  Patient is speaking to me in full sentences.  Handling secretions well.  HENT:     Head: Normocephalic and atraumatic.     Jaw: There is normal jaw occlusion. No trismus, swelling or malocclusion.     Nose: No congestion or rhinorrhea.     Right Sinus: No maxillary sinus tenderness or frontal sinus tenderness.     Left Sinus: No maxillary sinus tenderness or frontal sinus tenderness.     Mouth/Throat:     Mouth: Mucous membranes are moist. No oral lesions.     Dentition: Normal dentition.     Tongue: No lesions.     Palate: No mass and lesions.     Pharynx: Oropharynx is clear. Uvula midline. No pharyngeal swelling, oropharyngeal exudate, posterior oropharyngeal erythema or uvula swelling.     Tonsils: No tonsillar exudate or tonsillar abscesses. 1+ on the right. 1+ on the left.     Comments: Patient without tonsillar enlargement or exudate.  No signs of peritonsillar abscess, palate without any tenderness or masses palpated.  No swelling under the tongue, uvula is midline without any inflammation. Eyes:     General: No visual field deficit.       Right eye: No discharge.        Left eye: No discharge.     Extraocular Movements: Extraocular movements intact.     Conjunctiva/sclera: Conjunctivae normal.     Pupils: Pupils are equal, round, and reactive to light.  Cardiovascular:     Rate and Rhythm: Normal rate and regular rhythm.     Pulses: Normal pulses.     Heart sounds: Normal heart sounds. No murmur heard.   No friction rub. No gallop.  Pulmonary:     Effort: Pulmonary effort is normal. No respiratory distress.     Breath sounds: Normal breath  sounds. No stridor. No wheezing, rhonchi or rales.  Chest:     Chest wall: No tenderness.  Abdominal:     General: Abdomen is flat. Bowel sounds are normal. There is no distension.     Palpations: Abdomen is soft.     Tenderness: There is no abdominal tenderness. There is no right CVA tenderness or left CVA tenderness.  Musculoskeletal:        General: No swelling or tenderness. Normal range of motion.  Cervical back: Normal range of motion. No rigidity or tenderness.     Right lower leg: No edema.     Left lower leg: No edema.  Lymphadenopathy:     Cervical: No cervical adenopathy.  Skin:    General: Skin is warm and dry.     Capillary Refill: Capillary refill takes less than 2 seconds.     Findings: No erythema or rash.  Neurological:     General: No focal deficit present.     Mental Status: She is alert and oriented to person, place, and time.     Cranial Nerves: Cranial nerves are intact. No cranial nerve deficit or facial asymmetry.     Motor: Motor function is intact. No weakness.     Coordination: Coordination is intact.     Gait: Gait is intact. Gait normal.  Psychiatric:        Mood and Affect: Mood normal.    ED Results / Procedures / Treatments   Labs (all labs ordered are listed, but only abnormal results are displayed) Labs Reviewed  RESP PANEL BY RT-PCR (FLU A&B, COVID) ARPGX2 - Abnormal; Notable for the following components:      Result Value   SARS Coronavirus 2 by RT PCR POSITIVE (*)    All other components within normal limits    EKG None  Radiology DG Chest 2 View  Result Date: 02/11/2021 CLINICAL DATA:  Fever and chills. EXAM: CHEST - 2 VIEW COMPARISON:  Chest radiograph dated 06/29/2018. FINDINGS: The heart size and mediastinal contours are within normal limits. Both lungs are clear. The visualized skeletal structures are unremarkable. IMPRESSION: No active cardiopulmonary disease. Electronically Signed   By: Elgie Collard M.D.   On: 02/11/2021  20:44    Procedures Procedures   Medications Ordered in ED Medications  acetaminophen (TYLENOL) tablet 650 mg (has no administration in time range)  ondansetron (ZOFRAN-ODT) disintegrating tablet 4 mg (4 mg Oral Given 02/11/21 2049)    ED Course  I have reviewed the triage vital signs and the nursing notes.  Pertinent labs & imaging results that were available during my care of the patient were reviewed by me and considered in my medical decision making (see chart for details).    MDM Rules/Calculators/A&P                           Patient presents emerged department today for URI symptoms.  Patient's vital signs are stable, patient is nontoxic-appearing.  During triage patient was given Zofran, patient is tolerating oral fluids currently.  COVID test is positive.  Symptomatic treatment discussed, patient does not have any shortness of breath, chest x-ray interpreted me with no acute cardiopulmonary disease.  This was also done during triage.  Does not qualify for antivirals at this time, patient to be discharged.  Strict return precautions given.  Doubt need for further emergent work up at this time. I explained the diagnosis and have given explicit precautions to return to the ER including for any other new or worsening symptoms. The patient understands and accepts the medical plan as it's been dictated and I have answered their questions. Discharge instructions concerning home care and prescriptions have been given. The patient is STABLE and is discharged to home in good condition.  Final Clinical Impression(s) / ED Diagnoses Final diagnoses:  COVID    Rx / DC Orders ED Discharge Orders          Ordered  ondansetron (ZOFRAN ODT) 4 MG disintegrating tablet  Every 8 hours PRN        02/11/21 2217             Farrel Gordon, PA-C 02/11/21 2239    Cheryll Cockayne, MD 02/12/21 [phone]

## 2021-02-13 ENCOUNTER — Other Ambulatory Visit: Payer: Self-pay

## 2021-02-13 ENCOUNTER — Encounter (HOSPITAL_COMMUNITY): Payer: Self-pay | Admitting: Emergency Medicine

## 2021-02-13 ENCOUNTER — Emergency Department (HOSPITAL_COMMUNITY): Payer: Self-pay

## 2021-02-13 ENCOUNTER — Emergency Department (HOSPITAL_COMMUNITY)
Admission: EM | Admit: 2021-02-13 | Discharge: 2021-02-14 | Disposition: A | Discharge status: Home / Self Care | Payer: Self-pay | Attending: Emergency Medicine | Admitting: Emergency Medicine

## 2021-02-13 DIAGNOSIS — U071 COVID-19: Secondary | ICD-10-CM | POA: Insufficient documentation

## 2021-02-13 LAB — POC URINE PREG, ED: Preg Test, Ur: NEGATIVE

## 2021-02-13 MED ORDER — ACETAMINOPHEN 325 MG PO TABS
650.0000 mg | ORAL_TABLET | Freq: Once | ORAL | Status: AC
  Administered 2021-02-13: 650 mg via ORAL
  Filled 2021-02-13: qty 2

## 2021-02-13 NOTE — ED Triage Notes (Signed)
Pt here for worsening shob, fever, chills, N/V. Pt tested covid + on 8/22. Pt took motrin w/o relief. Decreased appetite.

## 2021-02-13 NOTE — ED Provider Notes (Signed)
Emergency Medicine Provider Triage Evaluation Note  [facility] , a 30 y.o. female  was evaluated in triage.  Pt complains of covid.  Review of Systems  Positive: Cough, body aches, fever, chills, cp, sob Negative: N/v/d, dysuria  Physical Exam  BP (!) 120/93   Pulse 88   Temp (!) 100.8 F (38.2 C) (Oral)   Resp 16   LMP 02/05/2021   SpO2 100%  Gen:   Awake, no distress   Resp:  Normal effort  MSK:   Moves extremities without difficulty  Other:    Medical Decision Making  Medically screening exam initiated at 9:04 PM.  Appropriate orders placed.  [facility] was informed that the remainder of the evaluation will be completed by another provider, this initial triage assessment does not replace that evaluation, and the importance of remaining in the ED until their evaluation is complete.  Patient with COVID symptoms for the past 6 days, had a positive COVID test 2 days ago, presenting today with worsening shortness of breath and progressive cough.  She has not had any COVID vaccination.  Last menstrual period was a week ago.  No other significant past medical history.   Fayrene Helper, PA-C 02/13/21 2105    Cathren Laine, MD 02/13/21 [phone]

## 2021-02-14 LAB — TROPONIN I (HIGH SENSITIVITY): Troponin I (High Sensitivity): <2 ng/L (ref <18)

## 2021-02-14 MED ORDER — BENZONATATE 100 MG PO CAPS
100.0000 mg | ORAL_CAPSULE | Freq: Once | ORAL | Status: AC
  Administered 2021-02-14: 100 mg via ORAL
  Filled 2021-02-14: qty 1

## 2021-02-14 MED ORDER — ALBUTEROL SULFATE HFA 108 (90 BASE) MCG/ACT IN AERS
2.0000 | INHALATION_SPRAY | Freq: Once | RESPIRATORY_TRACT | Status: AC
  Administered 2021-02-14: 2 via RESPIRATORY_TRACT
  Filled 2021-02-14: qty 6.7

## 2021-02-14 NOTE — Discharge Instructions (Signed)
Use 2 puffs of an inhaler ever 4-6 hours for management of shortness of breath. Continue ibuprofen 400mg  every 6  hours as well as Zofran as prescribed for nausea. Follow up with a primary care doctor.

## 2021-02-14 NOTE — ED Notes (Signed)
Patient verbalizes understanding of discharge instructions. Opportunity for questioning and answers were provided. Armband removed by staff, pt discharged from ED ambulatory.   

## 2021-02-14 NOTE — ED Notes (Signed)
Boyfriend Will [phone] would like an update

## 2021-02-14 NOTE — ED Notes (Signed)
Amb pt with pulse ox on left 2nd digit. Pt maintained O2 bwtn 99-96% during walk. Pt reports that ambulation was hard but was also educated on COVID symptoms and home care while recovering from COVID-19.

## 2021-02-14 NOTE — ED Provider Notes (Signed)
[facility] EMERGENCY DEPARTMENT Provider Note   CSN: [id number] Arrival date & time: 02/13/21  2044     History Chief Complaint  Patient presents with   Shortness of Breath   Fever    [facility] is a 30 y.o. female.  30 year old female presents to the emergency department for 6 days of COVID symptoms.  Tested positive for COVID in the ED 2 days ago.  States that her shortness of breath has been subjectively worse.  Her cough has been progressive.  She has been taking Zofran and ibuprofen for symptoms without relief.  The history is provided by the patient. No language interpreter was used.  Shortness of Breath Onset quality:  Gradual Duration:  6 days Timing:  Constant Progression:  Worsening Chronicity:  New Context: URI (COVID+)   Relieved by:  Nothing Ineffective treatments:  NSAIDs Associated symptoms: cough, fever, sore throat and vomiting   Vomiting:    Duration:  6 days   Timing:  Sporadic   Progression:  Unchanged Fever Associated symptoms: cough, sore throat and vomiting       Past Medical History:  Diagnosis Date   Medical history non-contributory     Patient Active Problem List   Diagnosis Date Noted   Premature uterine contractions 08/23/2013   Fetal heart rate decelerations, delivered 08/23/2013   Nausea and vomiting in pregnancy prior to [redacted] weeks gestation 03/15/2013    Past Surgical History:  Procedure Laterality Date   CESAREAN SECTION     CESAREAN SECTION N/A 08/09/2012   Procedure: Repeat CESAREAN SECTION of baby boy  at 0431  APGAR 9/9;  Surgeon: Kathreen Cosier, MD;  Location: [facility];  Service: Obstetrics;  Laterality: N/A;   CESAREAN SECTION N/A 08/23/2013   Procedure: CESAREAN SECTION;  Surgeon: Brock Bad, MD;  Location: [facility];  Service: Obstetrics;  Laterality: N/A;     OB History     Gravida  4   Para  4   Term  3   Preterm  1   AB      Living  4      SAB      IAB      Ectopic       Multiple      Live Births  4           Family History  Problem Relation Age of Onset   Kidney disease Father    Diabetes Maternal Grandmother    Other Neg Hx     Social History   Tobacco Use   Smoking status: Never   Smokeless tobacco: Never  Substance Use Topics   Alcohol use: No   Drug use: No    Home Medications Prior to Admission medications   Medication Sig Start Date End Date Taking? Authorizing Provider  benzonatate (TESSALON) 100 MG capsule Take 1 capsule (100 mg total) by mouth 3 (three) times daily as needed for cough. 06/29/18   Robinson, [place] N, PA-C  cephALEXin (KEFLEX) 500 MG capsule Take 1 capsule (500 mg total) by mouth 4 (four) times daily. 09/22/15   Zadie Rhine, MD  ondansetron (ZOFRAN ODT) 4 MG disintegrating tablet Take 1 tablet (4 mg total) by mouth every 8 (eight) hours as needed for nausea or vomiting. 02/11/21   Farrel Gordon, PA-C    Allergies    Patient has no known allergies.  Review of Systems   Review of Systems  Constitutional:  Positive for fever.  HENT:  Positive for sore throat.   Respiratory:  Positive for cough and shortness of breath.   Gastrointestinal:  Positive for vomiting.  Ten systems reviewed and are negative for acute change, except as noted in the HPI.    Physical Exam Updated Vital Signs BP 107/68   Pulse 60   Temp 98.8 F (37.1 C) (Oral)   Resp 14   Ht 5\' 8"  (1.727 m)   Wt 57 kg   LMP 02/05/2021   SpO2 98%   BMI 19.11 kg/m   Physical Exam Vitals and nursing note reviewed.  Constitutional:      General: She is not in acute distress.    Appearance: She is well-developed. She is not diaphoretic.     Comments: Appears fatigued, but nontoxic.  HENT:     Head: Normocephalic and atraumatic.  Eyes:     General: No scleral icterus.    Conjunctiva/sclera: Conjunctivae normal.  Cardiovascular:     Rate and Rhythm: Normal rate and regular rhythm.     Pulses: Normal pulses.  Pulmonary:     Effort:  Pulmonary effort is normal. No respiratory distress.     Breath sounds: No stridor. No wheezing or rales.     Comments: Lungs CTAB. Respirations even and unlabored. Abdominal:     General: There is no distension.  Musculoskeletal:        General: Normal range of motion.     Cervical back: Normal range of motion.  Skin:    General: Skin is warm and dry.     Coloration: Skin is not pale.     Findings: No erythema or rash.  Neurological:     Mental Status: She is alert and oriented to person, place, and time.     Coordination: Coordination normal.  Psychiatric:        Behavior: Behavior normal.    ED Results / Procedures / Treatments   Labs (all labs ordered are listed, but only abnormal results are displayed) Labs Reviewed  POC URINE PREG, ED  TROPONIN I (HIGH SENSITIVITY)    EKG EKG Interpretation  Date/Time:  Wednesday February 13 2021 20:44:04 EDT Ventricular Rate:  83 PR Interval:  128 QRS Duration: 86 QT Interval:  364 QTC Calculation: 427 R Axis:   90 Text Interpretation: Normal sinus rhythm Rightward axis Nonspecific T wave abnormality Abnormal ECG Confirmed by 06[birth year]23[birth year]1992 [phone]) on 02/14/2021 2:51:28 AM  Radiology DG Chest 2 View  Result Date: 02/13/2021 CLINICAL DATA:  Worsening shortness of breath, fever, chills, COVID-19 positive 02/11/2021 EXAM: CHEST - 2 VIEW COMPARISON:  02/11/2021 FINDINGS: The heart size and mediastinal contours are within normal limits. Both lungs are clear. The visualized skeletal structures are unremarkable. IMPRESSION: No active cardiopulmonary disease. Electronically Signed   By: 02/13/2021 M.D.   On: 02/13/2021 21:34    Procedures Procedures   Medications Ordered in ED Medications  acetaminophen (TYLENOL) tablet 650 mg (650 mg Oral Given 02/13/21 2121)  albuterol (VENTOLIN HFA) 108 (90 Base) MCG/ACT inhaler 2 puff (2 puffs Inhalation Given 02/14/21 0342)  benzonatate (TESSALON) capsule 100 mg (100 mg Oral Given 02/14/21 02/16/21)     ED Course  I have reviewed the triage vital signs and the nursing notes.  Pertinent labs & imaging results that were available during my care of the patient were reviewed by me and considered in my medical decision making (see chart for details).  Clinical Course as of 02/14/21 0512  Thu Feb 14, 2021  Feb 16, 2021 Will add troponin  to stratify for possibility of myocarditis. Clinical suspicion is presently low. CXR stable compared to 48 hours ago. Vitals reassuring. No hypoxia. Will ambulate on pulse ox. [KH]  0330 Ambulated well, 99-96% on room air. [KH]  0509 Troponin negative. Will discharge. [KH]    Clinical Course User Index [KH] Antony Madura, PA-C   MDM Rules/Calculators/A&P                           Patient was 6 days since onset of COVID symptoms.  Tested positive in the ED 48 hours ago.  Returns with complaint of persistent, worsening shortness of breath.  Her chest x-ray is stable.  Vitals reassuring without hypoxia.  No objective findings to warrant admission or further emergent intervention.  Given albuterol inhaler to use as needed.  Encouraged primary care follow-up for recheck.  Discharged in stable condition.  LIS SAVITT was evaluated in Emergency Department on 02/14/2021 for the symptoms described in the history of present illness. She was evaluated in the context of the global COVID-19 pandemic, which necessitated consideration that the patient might be at risk for infection with the SARS-CoV-2 virus that causes COVID-19. Institutional protocols and algorithms that pertain to the evaluation of patients at risk for COVID-19 are in a state of rapid change based on information released by regulatory bodies including the CDC and federal and state organizations. These policies and algorithms were followed during the patient's care in the ED.  Final Clinical Impression(s) / ED Diagnoses Final diagnoses:  COVID-19    Rx / DC Orders ED Discharge Orders     None         Antony Madura, PA-C 02/14/21 0513    Sabas Sous, MD 02/14/21 [phone]

## 2021-03-01 ENCOUNTER — Emergency Department (HOSPITAL_COMMUNITY): Payer: Self-pay

## 2021-03-01 ENCOUNTER — Inpatient Hospital Stay (HOSPITAL_COMMUNITY)
Admission: EM | Admit: 2021-03-01 | Discharge: 2021-03-04 | DRG: 854 | Disposition: A | Discharge status: Home / Self Care | Payer: Self-pay | Attending: Internal Medicine | Admitting: Internal Medicine

## 2021-03-01 ENCOUNTER — Other Ambulatory Visit: Payer: Self-pay

## 2021-03-01 ENCOUNTER — Encounter (HOSPITAL_COMMUNITY): Payer: Self-pay

## 2021-03-01 DIAGNOSIS — N390 Urinary tract infection, site not specified: Secondary | ICD-10-CM | POA: Diagnosis present

## 2021-03-01 DIAGNOSIS — E876 Hypokalemia: Secondary | ICD-10-CM | POA: Diagnosis present

## 2021-03-01 DIAGNOSIS — Z833 Family history of diabetes mellitus: Secondary | ICD-10-CM

## 2021-03-01 DIAGNOSIS — Q613 Polycystic kidney, unspecified: Secondary | ICD-10-CM

## 2021-03-01 DIAGNOSIS — Q612 Polycystic kidney, adult type: Secondary | ICD-10-CM

## 2021-03-01 DIAGNOSIS — N10 Acute tubulo-interstitial nephritis: Secondary | ICD-10-CM

## 2021-03-01 DIAGNOSIS — A4150 Gram-negative sepsis, unspecified: Principal | ICD-10-CM | POA: Diagnosis present

## 2021-03-01 DIAGNOSIS — E872 Acidosis: Secondary | ICD-10-CM | POA: Diagnosis present

## 2021-03-01 DIAGNOSIS — N12 Tubulo-interstitial nephritis, not specified as acute or chronic: Secondary | ICD-10-CM | POA: Diagnosis present

## 2021-03-01 DIAGNOSIS — E871 Hypo-osmolality and hyponatremia: Secondary | ICD-10-CM | POA: Diagnosis present

## 2021-03-01 DIAGNOSIS — Z841 Family history of disorders of kidney and ureter: Secondary | ICD-10-CM

## 2021-03-01 DIAGNOSIS — B9620 Unspecified Escherichia coli [E. coli] as the cause of diseases classified elsewhere: Secondary | ICD-10-CM | POA: Diagnosis present

## 2021-03-01 DIAGNOSIS — N202 Calculus of kidney with calculus of ureter: Secondary | ICD-10-CM | POA: Diagnosis present

## 2021-03-01 DIAGNOSIS — N939 Abnormal uterine and vaginal bleeding, unspecified: Secondary | ICD-10-CM | POA: Diagnosis present

## 2021-03-01 DIAGNOSIS — R739 Hyperglycemia, unspecified: Secondary | ICD-10-CM | POA: Diagnosis present

## 2021-03-01 DIAGNOSIS — N136 Pyonephrosis: Secondary | ICD-10-CM | POA: Diagnosis present

## 2021-03-01 DIAGNOSIS — D638 Anemia in other chronic diseases classified elsewhere: Secondary | ICD-10-CM

## 2021-03-01 DIAGNOSIS — Z8616 Personal history of COVID-19: Secondary | ICD-10-CM

## 2021-03-01 DIAGNOSIS — A419 Sepsis, unspecified organism: Secondary | ICD-10-CM | POA: Diagnosis present

## 2021-03-01 LAB — URINALYSIS, ROUTINE W REFLEX MICROSCOPIC
Bilirubin Urine: NEGATIVE
Glucose, UA: NEGATIVE mg/dL
Ketones, ur: NEGATIVE mg/dL
Nitrite: POSITIVE — AB
Protein, ur: 100 mg/dL — AB
Specific Gravity, Urine: 1.015 (ref 1.005–1.030)
pH: 6.5 (ref 5.0–8.0)

## 2021-03-01 LAB — PREGNANCY, URINE: Preg Test, Ur: NEGATIVE

## 2021-03-01 LAB — CBC
HCT: 31.8 % — ABNORMAL LOW (ref 36.0–46.0)
Hemoglobin: 10.8 g/dL — ABNORMAL LOW (ref 12.0–15.0)
MCH: 32.1 pg (ref 26.0–34.0)
MCHC: 34 g/dL (ref 30.0–36.0)
MCV: 94.6 fL (ref 80.0–100.0)
Platelets: 256 10*3/uL (ref 150–400)
RBC: 3.36 MIL/uL — ABNORMAL LOW (ref 3.87–5.11)
RDW: 12.3 % (ref 11.5–15.5)
WBC: 17.8 10*3/uL — ABNORMAL HIGH (ref 4.0–10.5)
nRBC: 0 % (ref 0.0–0.2)

## 2021-03-01 LAB — COMPREHENSIVE METABOLIC PANEL
ALT: 8 U/L (ref 0–44)
AST: 14 U/L — ABNORMAL LOW (ref 15–41)
Albumin: 3.4 g/dL — ABNORMAL LOW (ref 3.5–5.0)
Alkaline Phosphatase: 40 U/L (ref 38–126)
Anion gap: 11 (ref 5–15)
BUN: 15 mg/dL (ref 6–20)
CO2: 21 mmol/L — ABNORMAL LOW (ref 22–32)
Calcium: 9.1 mg/dL (ref 8.9–10.3)
Chloride: 101 mmol/L (ref 98–111)
Creatinine, Ser: 0.79 mg/dL (ref 0.44–1.00)
GFR, Estimated: >60 mL/min (ref >60)
Glucose, Bld: 148 mg/dL — ABNORMAL HIGH (ref 70–99)
Potassium: 3.4 mmol/L — ABNORMAL LOW (ref 3.5–5.1)
Sodium: 133 mmol/L — ABNORMAL LOW (ref 135–145)
Total Bilirubin: 0.7 mg/dL (ref 0.3–1.2)
Total Protein: 6.8 g/dL (ref 6.5–8.1)

## 2021-03-01 LAB — URINALYSIS, MICROSCOPIC (REFLEX)
RBC / HPF: >50 RBC/hpf (ref 0–5)
WBC, UA: >50 WBC/hpf (ref 0–5)

## 2021-03-01 LAB — WET PREP, GENITAL
Clue Cells Wet Prep HPF POC: NONE SEEN
Sperm: NONE SEEN
Trich, Wet Prep: NONE SEEN
Yeast Wet Prep HPF POC: NONE SEEN

## 2021-03-01 LAB — I-STAT BETA HCG BLOOD, ED (MC, WL, AP ONLY): I-stat hCG, quantitative: 9.8 m[IU]/mL — ABNORMAL HIGH (ref <5)

## 2021-03-01 LAB — LACTIC ACID, PLASMA: Lactic Acid, Venous: 1.2 mmol/L (ref 0.5–1.9)

## 2021-03-01 LAB — LIPASE, BLOOD: Lipase: 20 U/L (ref 11–51)

## 2021-03-01 MED ORDER — LACTATED RINGERS IV BOLUS
1000.0000 mL | Freq: Once | INTRAVENOUS | Status: AC
  Administered 2021-03-01: 1000 mL via INTRAVENOUS

## 2021-03-01 MED ORDER — ONDANSETRON HCL 4 MG/2ML IJ SOLN
4.0000 mg | Freq: Once | INTRAMUSCULAR | Status: AC
  Administered 2021-03-01: 4 mg via INTRAVENOUS
  Filled 2021-03-01: qty 2

## 2021-03-01 MED ORDER — SODIUM CHLORIDE 0.9 % IV SOLN
1.0000 g | Freq: Once | INTRAVENOUS | Status: AC
  Administered 2021-03-01: 1 g via INTRAVENOUS
  Filled 2021-03-01: qty 10

## 2021-03-01 MED ORDER — SODIUM CHLORIDE 0.9 % IV SOLN: 1.0000 g | Freq: Once | INTRAVENOUS | Status: DC

## 2021-03-01 MED ORDER — LACTATED RINGERS IV SOLN: INTRAVENOUS | Status: DC

## 2021-03-01 MED ORDER — ONDANSETRON HCL 4 MG PO TABS: 4.0000 mg | ORAL_TABLET | Freq: Four times a day (QID) | ORAL | Status: DC | PRN

## 2021-03-01 MED ORDER — ACETAMINOPHEN 650 MG RE SUPP: 650.0000 mg | Freq: Four times a day (QID) | RECTAL | Status: DC | PRN

## 2021-03-01 MED ORDER — KETOROLAC TROMETHAMINE 60 MG/2ML IM SOLN
60.0000 mg | Freq: Once | INTRAMUSCULAR | Status: AC
  Administered 2021-03-01: 60 mg via INTRAMUSCULAR
  Filled 2021-03-01: qty 2

## 2021-03-01 MED ORDER — MORPHINE SULFATE (PF) 2 MG/ML IV SOLN: 2.0000 mg | INTRAVENOUS | Status: DC | PRN

## 2021-03-01 MED ORDER — ACETAMINOPHEN 325 MG PO TABS: 650.0000 mg | ORAL_TABLET | Freq: Four times a day (QID) | ORAL | Status: DC | PRN

## 2021-03-01 MED ORDER — ONDANSETRON HCL 4 MG/2ML IJ SOLN: 4.0000 mg | Freq: Four times a day (QID) | INTRAMUSCULAR | Status: DC | PRN

## 2021-03-01 MED ORDER — SODIUM CHLORIDE 0.9 % IV SOLN
2.0000 g | INTRAVENOUS | Status: DC
  Administered 2021-03-02: 2 g via INTRAVENOUS
  Filled 2021-03-01 (×2): qty 20

## 2021-03-01 MED ORDER — MORPHINE SULFATE (PF) 2 MG/ML IV SOLN
2.0000 mg | Freq: Once | INTRAVENOUS | Status: AC
  Administered 2021-03-01: 2 mg via INTRAVENOUS
  Filled 2021-03-01: qty 1

## 2021-03-01 MED ORDER — OXYCODONE HCL 5 MG PO TABS
5.0000 mg | ORAL_TABLET | ORAL | Status: DC | PRN
  Administered 2021-03-01 – 2021-03-03 (×5): 5 mg via ORAL
  Filled 2021-03-01 (×5): qty 1

## 2021-03-01 MED ORDER — HYDROMORPHONE HCL 1 MG/ML IJ SOLN
0.5000 mg | Freq: Once | INTRAMUSCULAR | Status: AC
  Administered 2021-03-01: 0.5 mg via INTRAVENOUS
  Filled 2021-03-01: qty 1

## 2021-03-01 MED ORDER — POTASSIUM CHLORIDE CRYS ER 20 MEQ PO TBCR
40.0000 meq | EXTENDED_RELEASE_TABLET | Freq: Once | ORAL | Status: AC
  Administered 2021-03-01: 40 meq via ORAL
  Filled 2021-03-01: qty 2

## 2021-03-01 MED ORDER — IOHEXOL 350 MG/ML SOLN
70.0000 mL | Freq: Once | INTRAVENOUS | Status: AC | PRN
  Administered 2021-03-01: 70 mL via INTRAVENOUS

## 2021-03-01 NOTE — H&P (Signed)
History and Physical    Colleen Oneill BTD:[id number] DOB: 1991/02/10 DOA: 03/01/2021  PCP: Patient, No Pcp Per (Inactive)   Patient coming from: home Chief Complaint  Patient presents with   Abdominal Pain   Back Pain     HPI: Colleen Oneill is a 30 y.o. female with medical history significant for recent COVID infection positive on 8/22 symptom onset 8/19 presents to the ED with 2 days history of bilateral flank pain, abdominal pain, nausea, nonbloody nonbilious vomiting along with urinary frequency, urgency and dysuria.  She also complains of vaginal bleeding since her last menstruation a week ago.  In the ED tachycardic in 110s, leukocytosis 17 K, low-grade fever 100 100s vitals stable labs showed anemia, stable renal function, urine test is negative, i-STAT hCG 9.8.  Abnormal urinalysis with pyuria, hematuria proteinuria and nitrate positive.  Underwent CT of the abdomen pelvis that showed new diagnosis of adult polycystic kidney disease, and pyelonephritis on the right with 5 mm obstructing stone at the right UVJ with hydroureteronephrosis.  Dr. Milford Cage from nephrology was consulted initially planned for discharge but patient preferred admission and is being admitted to [facility] per recommendation of urology.  Patient received IV fluids, opiates for pain control and IV ceftriaxone.  Blood culture urine culture sent, lactic acid pending  Assessment/Plan  Sepsis POA due to pyelonephritis due to obstructive stone  Acute Right pyelonephritis Right 5 mm obstructing stone with hydroureteronephrosis: Patient met sepsis criteria with tachycardia 111,leukocytosis 17k with pyelonephritis source.We will admit the patient keep on ceftriaxone 2 g daily, monitor vitals, lactic acid pending.  Follow-up urine and blood culture.  Urology Dr. Milford Cage already consulted keep n.p.o. past midnight for possible procedure tomorrow  Mild hyponatremia continue on IV fluids Mild hypokalemia replete Mild  metabolic acidosis bicarb 21 keep on IV fluids and monitor.  Hyperglycemia-check hemoglobin A1c  Polycystic kidney disease: New diagnosis.father with similar history and now on dialysis.  She will need outpatient follow-up with nephrology- pt instructed t odo so in near future. Added nephrology no for her to call.  Anemia, chronic disease: Monitor hemoglobin. Suspect IDA- check anemia panel.  Vaginal bleeding since her last menstrual period if remains persistent may need GYN consult. Monitor h/h.  Body mass index is 19.01 kg/m.   Severity of Illness: * I certify that at the point of admission it is my clinical judgment that the patient will require inpatient hospital care spanning beyond 2 midnights from the point of admission due to high intensity of service, high risk for further deterioration and high frequency of surveillance required.*   DVT prophylaxis:SCD Code Status:   Code Status: Full Code  Family Communication: Admission, patients condition and plan of care including tests being ordered have been discussed with the patient who indicate understanding and agree with the plan and Code Status.  Consults called: Urology  Review of Systems: All systems were reviewed and were negative except as mentioned in HPI above. Negative for FOCAL WEAKNESS Negative for chest pain Negative for shortness of breath  Past Medical History:  Diagnosis Date   Medical history non-contributory     Past Surgical History:  Procedure Laterality Date   CESAREAN SECTION     CESAREAN SECTION N/A 08/09/2012   Procedure: Repeat CESAREAN SECTION of baby boy  at [facility] 9/9;  Surgeon: Frederico Hamman, MD;  Location: [facility];  Service: Obstetrics;  Laterality: N/A;   CESAREAN SECTION N/A 08/23/2013   Procedure: CESAREAN SECTION;  Surgeon: Shelly Bombard, MD;  Location: [facility];  Service: Obstetrics;  Laterality: N/A;     reports that she has never smoked. She has never used smokeless tobacco. She  reports that she does not drink alcohol and does not use drugs.  No Known Allergies  Family History  Problem Relation Age of Onset   Kidney disease Father    Diabetes Maternal Grandmother    Other Neg Hx      Prior to Admission medications   Medication Sig Start Date End Date Taking? Authorizing Provider  acetaminophen (TYLENOL) 500 MG tablet Take 1,000 mg by mouth every 6 (six) hours as needed for mild pain.   Yes [provider]  ibuprofen (ADVIL) 200 MG tablet Take 600 mg by mouth every 6 (six) hours as needed for mild pain.   Yes [provider]  ondansetron (ZOFRAN ODT) 4 MG disintegrating tablet Take 1 tablet (4 mg total) by mouth every 8 (eight) hours as needed for nausea or vomiting. 02/11/21  Yes Alfredia Client, PA-C    Physical Exam: Vitals:   03/01/21 1445 03/01/21 1500 03/01/21 1515 03/01/21 1615  BP: 108/70 104/72 109/69 105/76  Pulse: (!) 103 (!) 103 (!) 111 (!) 111  Resp: (!) 23 (!) 23 (!) 21 20  Temp:      TempSrc:      SpO2: 99% 100% 100% 99%  Weight:      Height:        General exam: AAOX3, thin, pleasant, HEENT:Oral mucosa moist, Ear/Nose WNL grossly, dentition normal. Respiratory system: bilaterally clear,no wheezing or crackles,no use of accessory muscle Cardiovascular system: S1 & S2 +, No JVD,. Gastrointestinal system: Abdomen soft, tender rt flank and rt abdomen anteriorly,ND, BS+ Nervous System:Alert, awake, moving extremities and grossly nonfocal Extremities: No edema, distal peripheral pulses palpable.  Skin: No rashes,no icterus. MSK: Normal muscle bulk,tone, power   Labs on Admission: I have personally reviewed following labs and imaging studies  CBC: Recent Labs  Lab 03/01/21 1005  WBC 17.8*  HGB 10.8*  HCT 31.8*  MCV 94.6  PLT 242   Basic Metabolic Panel: Recent Labs  Lab 03/01/21 1005  NA 133*  K 3.4*  CL 101  CO2 21*  GLUCOSE 148*  BUN 15  CREATININE 0.79  CALCIUM 9.1   GFR: Estimated Creatinine  Clearance: 92.9 mL/min (by C-G formula based on SCr of 0.79 mg/dL). Liver Function Tests: Recent Labs  Lab 03/01/21 1005  AST 14*  ALT 8  ALKPHOS 40  BILITOT 0.7  PROT 6.8  ALBUMIN 3.4*   Recent Labs  Lab 03/01/21 1005  LIPASE 20   No results for input(s): AMMONIA in the last 168 hours. Coagulation Profile: No results for input(s): INR, PROTIME in the last 168 hours. Cardiac Enzymes: No results for input(s): CKTOTAL, CKMB, CKMBINDEX, TROPONINI in the last 168 hours. BNP (last 3 results) No results for input(s): PROBNP in the last 8760 hours. HbA1C: No results for input(s): HGBA1C in the last 72 hours. CBG: No results for input(s): GLUCAP in the last 168 hours. Lipid Profile: No results for input(s): CHOL, HDL, LDLCALC, TRIG, CHOLHDL, LDLDIRECT in the last 72 hours. Thyroid Function Tests: No results for input(s): TSH, T4TOTAL, FREET4, T3FREE, THYROIDAB in the last 72 hours. Anemia Panel: No results for input(s): VITAMINB12, FOLATE, FERRITIN, TIBC, IRON, RETICCTPCT in the last 72 hours. Urine analysis:    Component Value Date/Time   COLORURINE YELLOW 03/01/2021 1030   APPEARANCEUR CLOUDY (A) 03/01/2021 1030  LABSPEC 1.015 03/01/2021 1030   PHURINE 6.5 03/01/2021 1030   GLUCOSEU NEGATIVE 03/01/2021 1030   HGBUR LARGE (A) 03/01/2021 1030   BILIRUBINUR NEGATIVE 03/01/2021 1030   BILIRUBINUR NEGATIVE 08/03/2013 1515   KETONESUR NEGATIVE 03/01/2021 1030   PROTEINUR 100 (A) 03/01/2021 1030   UROBILINOGEN 2.0 (H) 08/22/2013 2004   NITRITE POSITIVE (A) 03/01/2021 1030   LEUKOCYTESUR MODERATE (A) 03/01/2021 1030    Radiological Exams on Admission: CT Abdomen Pelvis W Contrast  Result Date: 03/01/2021 CLINICAL DATA:  Abdominal pain for 2 days EXAM: CT ABDOMEN AND PELVIS WITH CONTRAST TECHNIQUE: Multidetector CT imaging of the abdomen and pelvis was performed using the standard protocol following bolus administration of intravenous contrast. CONTRAST:  23m OMNIPAQUE  IOHEXOL 350 MG/ML SOLN COMPARISON:  None. FINDINGS: Lower chest: The lung bases are clear. Hepatobiliary: There are numerous cysts throughout the liver. There are no enhancing lesions. The gallbladder is unremarkable. There is no biliary ductal dilatation. Pancreas: Unremarkable. Spleen: Unremarkable. Adrenals/Urinary Tract: The adrenals are unremarkable. The kidneys are enlarged measuring up to 15.2 cm in length on the left and 17.0 cm on the right. There are innumerable cysts throughout both kidneys consistent with polycystic kidney disease. There are small nonobstructing left renal stones. There is no left hydronephrosis. There is a 5 mm pocket obstructing stone at the right UVJ with mild upstream hydroureteronephrosis. There is mild stranding in the perinephric fat adjacent to the right lower pole. There is possible relative hypoenhancement of the thinned renal parenchyma in the right lower pole (7-41). The bladder is decompressed but grossly unremarkable. Stomach/Bowel: The stomach is unremarkable. There is no evidence of bowel obstruction. There is no evidence of bowel obstruction. There is no abnormal bowel wall thickening or inflammatory change. The tentatively identified appendix is normal (3-63). Vascular/Lymphatic: The abdominal aorta is normal in course and caliber. The main portal and splenic veins are patent. There is no abdominal or pelvic lymphadenopathy. Reproductive: The uterus and adnexa are unremarkable. Other: There is trace free fluid in the pelvis which may be reactive or physiologic. Musculoskeletal: There is no acute osseous abnormality. IMPRESSION: 1. Findings above consistent with polycystic kidney disease. 2. 4-5 mm obstructing stone at the right UVJ with mild upstream hydroureteronephrosis. Inflammatory stranding around the right lower pole which may be relatively hypoenhancing raises suspicion for pyelonephritis. No definite evidence of superinfection of a renal cyst. 3. Small  nonobstructing left renal stones. Electronically Signed   By: PValetta MoleM.D.   On: 03/01/2021 14:23      RAntonieta PertMD [facility]  If 7PM-7AM, please contact night-coverage [email or web address]  03/01/2021, 5:20 PM

## 2021-03-01 NOTE — ED Notes (Signed)
Patient transported to CT 

## 2021-03-01 NOTE — ED Notes (Signed)
Pelvic cart at bedside. 

## 2021-03-01 NOTE — ED Notes (Signed)
Attempted to call report, receiving RN took number to return phone call for report.

## 2021-03-01 NOTE — ED Triage Notes (Signed)
Pt reports "kidney pain"- pain to bilateral lower abd and back, denies dysuria. Low grade fever in triage.

## 2021-03-01 NOTE — ED Notes (Signed)
Patient transferred via [facility]. 

## 2021-03-01 NOTE — ED Provider Notes (Signed)
[facility] EMERGENCY DEPARTMENT Provider Note   CSN: [id number] Arrival date & time: 03/01/21  8338     History Chief Complaint  Patient presents with   Abdominal Pain   Back Pain    Colleen Oneill is a 30 y.o. female who presents with concern for bilateral flank pain and lower abdominal pain x2 days with associated dysuria, urinary frequency and urgency.  Associated nausea and vomiting with NBNB emesis, denies diarrhea.  Endorses chills but denies fevers.  Denies any chest pain or trouble breathing.  Endorses most recent LMP 1 week ago, though she continues to have vaginal bleeding which is abnormal for her.  Patient is sexually active with single female partner, endorses consistent condom use, denies concern for sexually transmitted infection.  Denies any vaginal discharge.  I have personally reviewed this patient's medical records.  She has history of and C-section x2.  Had COVID last month, not vaccinated.  Patient is not on any medications every day.  HPI     Past Medical History:  Diagnosis Date   Medical history non-contributory     Patient Active Problem List   Diagnosis Date Noted   Sepsis (HCC) 03/01/2021   Acute pyelonephritis 03/01/2021   Polycystic kidney disease 03/01/2021   Anemia, chronic disease 03/01/2021   Premature uterine contractions 08/23/2013   Fetal heart rate decelerations, delivered 08/23/2013   Nausea and vomiting in pregnancy prior to [redacted] weeks gestation 03/15/2013    Past Surgical History:  Procedure Laterality Date   CESAREAN SECTION     CESAREAN SECTION N/A 08/09/2012   Procedure: Repeat CESAREAN SECTION of baby boy  at 0431  APGAR 9/9;  Surgeon: Kathreen Cosier, MD;  Location: [facility];  Service: Obstetrics;  Laterality: N/A;   CESAREAN SECTION N/A 08/23/2013   Procedure: CESAREAN SECTION;  Surgeon: Brock Bad, MD;  Location: [facility];  Service: Obstetrics;  Laterality: N/A;     OB History     Gravida  4   Para  4    Term  3   Preterm  1   AB      Living  4      SAB      IAB      Ectopic      Multiple      Live Births  4           Family History  Problem Relation Age of Onset   Kidney disease Father    Diabetes Maternal Grandmother    Other Neg Hx     Social History   Tobacco Use   Smoking status: Never   Smokeless tobacco: Never  Substance Use Topics   Alcohol use: No   Drug use: No    Home Medications Prior to Admission medications   Medication Sig Start Date End Date Taking? Authorizing Provider  acetaminophen (TYLENOL) 500 MG tablet Take 1,000 mg by mouth every 6 (six) hours as needed for mild pain.   Yes [provider]  ibuprofen (ADVIL) 200 MG tablet Take 600 mg by mouth every 6 (six) hours as needed for mild pain.   Yes [provider]  ondansetron (ZOFRAN ODT) 4 MG disintegrating tablet Take 1 tablet (4 mg total) by mouth every 8 (eight) hours as needed for nausea or vomiting. 02/11/21  Yes Farrel Gordon, PA-C    Allergies    Patient has no known allergies.  Review of Systems   Review of Systems  Constitutional:  Positive for activity change, appetite change, chills and fatigue. Negative for fever.  HENT: Negative.    Respiratory: Negative.    Cardiovascular: Negative.   Gastrointestinal:  Positive for abdominal pain, nausea and vomiting. Negative for diarrhea.  Genitourinary:  Positive for dysuria, flank pain, frequency, menstrual problem, pelvic pain, urgency and vaginal bleeding. Negative for decreased urine volume, difficulty urinating and hematuria.  Neurological:  Positive for headaches. Negative for dizziness, syncope, weakness and light-headedness.   Physical Exam Updated Vital Signs BP 105/76   Pulse (!) 111   Temp 100 F (37.8 C) (Oral)   Resp 20   Ht 5\' 8"  (1.727 m)   Wt 56.7 kg   LMP 02/22/2021 (Approximate)   SpO2 99%   BMI 19.01 kg/m   Physical Exam Vitals and nursing note reviewed. Exam conducted with a  chaperone present.  Constitutional:      Appearance: She is normal weight. She is ill-appearing. She is not toxic-appearing.  HENT:     Head: Normocephalic and atraumatic.     Nose: Nose normal.     Mouth/Throat:     Mouth: Mucous membranes are moist.     Pharynx: Oropharynx is clear. Uvula midline. No oropharyngeal exudate or posterior oropharyngeal erythema.     Tonsils: No tonsillar exudate.  Eyes:     General: Lids are normal. Vision grossly intact.        Right eye: No discharge.        Left eye: No discharge.     Extraocular Movements: Extraocular movements intact.     Conjunctiva/sclera: Conjunctivae normal.     Pupils: Pupils are equal, round, and reactive to light.  Neck:     Trachea: Trachea and phonation normal.  Cardiovascular:     Rate and Rhythm: Regular rhythm. Tachycardia present.     Pulses: Normal pulses.     Heart sounds: Normal heart sounds. No murmur heard. Pulmonary:     Effort: Pulmonary effort is normal. No tachypnea, bradypnea, accessory muscle usage, prolonged expiration or respiratory distress.     Breath sounds: Normal breath sounds. No wheezing or rales.  Chest:     Chest wall: No mass, lacerations, deformity, swelling, tenderness, crepitus or edema.  Abdominal:     General: A surgical scar is present. Bowel sounds are normal. There is no distension.     Palpations: Abdomen is soft.     Tenderness: There is generalized abdominal tenderness. There is right CVA tenderness, left CVA tenderness and guarding. There is no rebound.    Genitourinary:    General: Normal vulva.     Exam position: Supine.     Vagina: Bleeding present.     Cervix: Dilated. Cervical bleeding present. No cervical motion tenderness.     Uterus: Normal.      Adnexa: Left adnexa normal.       Right: Tenderness present. No fullness.         Left: No tenderness or fullness.    Musculoskeletal:        General: No deformity.     Cervical back: Normal range of motion and neck  supple. No edema, rigidity or crepitus. No pain with movement, spinous process tenderness or muscular tenderness.     Right lower leg: No edema.     Left lower leg: No edema.  Lymphadenopathy:     Cervical: No cervical adenopathy.  Skin:    General: Skin is warm and dry.  Neurological:     Mental Status: She is alert  and oriented to person, place, and time.  Psychiatric:        Mood and Affect: Mood normal.    ED Results / Procedures / Treatments   Labs (all labs ordered are listed, but only abnormal results are displayed) Labs Reviewed  WET PREP, GENITAL - Abnormal; Notable for the following components:      Result Value   WBC, Wet Prep HPF POC FEW (*)    All other components within normal limits  COMPREHENSIVE METABOLIC PANEL - Abnormal; Notable for the following components:   Sodium 133 (*)    Potassium 3.4 (*)    CO2 21 (*)    Glucose, Bld 148 (*)    Albumin 3.4 (*)    AST 14 (*)    All other components within normal limits  CBC - Abnormal; Notable for the following components:   WBC 17.8 (*)    RBC 3.36 (*)    Hemoglobin 10.8 (*)    HCT 31.8 (*)    All other components within normal limits  URINALYSIS, ROUTINE W REFLEX MICROSCOPIC - Abnormal; Notable for the following components:   APPearance CLOUDY (*)    Hgb urine dipstick LARGE (*)    Protein, ur 100 (*)    Nitrite POSITIVE (*)    Leukocytes,Ua MODERATE (*)    All other components within normal limits  URINALYSIS, MICROSCOPIC (REFLEX) - Abnormal; Notable for the following components:   Bacteria, UA MANY (*)    All other components within normal limits  I-STAT BETA HCG BLOOD, ED ([facility], [facility], [facility] ONLY) - Abnormal; Notable for the following components:   I-stat hCG, quantitative 9.8 (*)    All other components within normal limits  URINE CULTURE  CULTURE, BLOOD (ROUTINE X 2)  CULTURE, BLOOD (ROUTINE X 2)  LIPASE, BLOOD  PREGNANCY, URINE  LACTIC ACID, PLASMA  LACTIC ACID, PLASMA  GC/CHLAMYDIA PROBE AMP ([facility]) NOT AT [facility]    EKG None  Radiology CT Abdomen Pelvis W Contrast  Result Date: 03/01/2021 CLINICAL DATA:  Abdominal pain for 2 days EXAM: CT ABDOMEN AND PELVIS WITH CONTRAST TECHNIQUE: Multidetector CT imaging of the abdomen and pelvis was performed using the standard protocol following bolus administration of intravenous contrast. CONTRAST:  63mL OMNIPAQUE IOHEXOL 350 MG/ML SOLN COMPARISON:  None. FINDINGS: Lower chest: The lung bases are clear. Hepatobiliary: There are numerous cysts throughout the liver. There are no enhancing lesions. The gallbladder is unremarkable. There is no biliary ductal dilatation. Pancreas: Unremarkable. Spleen: Unremarkable. Adrenals/Urinary Tract: The adrenals are unremarkable. The kidneys are enlarged measuring up to 15.2 cm in length on the left and 17.0 cm on the right. There are innumerable cysts throughout both kidneys consistent with polycystic kidney disease. There are small nonobstructing left renal stones. There is no left hydronephrosis. There is a 5 mm pocket obstructing stone at the right UVJ with mild upstream hydroureteronephrosis. There is mild stranding in the perinephric fat adjacent to the right lower pole. There is possible relative hypoenhancement of the thinned renal parenchyma in the right lower pole (7-41). The bladder is decompressed but grossly unremarkable. Stomach/Bowel: The stomach is unremarkable. There is no evidence of bowel obstruction. There is no evidence of bowel obstruction. There is no abnormal bowel wall thickening or inflammatory change. The tentatively identified appendix is normal (3-63). Vascular/Lymphatic: The abdominal aorta is normal in course and caliber. The main portal and splenic veins are patent. There is no abdominal or pelvic lymphadenopathy. Reproductive: The uterus and adnexa are unremarkable.  Other: There is trace free fluid in the pelvis which may be reactive or physiologic. Musculoskeletal: There is no acute  osseous abnormality. IMPRESSION: 1. Findings above consistent with polycystic kidney disease. 2. 4-5 mm obstructing stone at the right UVJ with mild upstream hydroureteronephrosis. Inflammatory stranding around the right lower pole which may be relatively hypoenhancing raises suspicion for pyelonephritis. No definite evidence of superinfection of a renal cyst. 3. Small nonobstructing left renal stones. Electronically Signed   By: Lesia Hausen M.D.   On: 03/01/2021 14:23    Procedures .Critical Care Performed by: Paris Lore, PA-C Authorized by: Paris Lore, PA-C   Critical care provider statement:    Critical care time (minutes):  45   Critical care was time spent personally by me on the following activities:  Discussions with consultants, evaluation of patient's response to treatment, examination of patient, ordering and performing treatments and interventions, ordering and review of laboratory studies, ordering and review of radiographic studies, pulse oximetry, re-evaluation of patient's condition, obtaining history from patient or surrogate and review of old charts   Medications Ordered in ED Medications  HYDROmorphone (DILAUDID) injection 0.5 mg (has no administration in time range)  ketorolac (TORADOL) injection 60 mg (has no administration in time range)  lactated ringers bolus 1,000 mL (has no administration in time range)  lactated ringers infusion (has no administration in time range)  morphine 2 MG/ML injection 2 mg (2 mg Intravenous Given 03/01/21 1140)  ondansetron (ZOFRAN) injection 4 mg (4 mg Intravenous Given 03/01/21 1141)  lactated ringers bolus 1,000 mL (0 mLs Intravenous Stopped 03/01/21 1600)  cefTRIAXone (ROCEPHIN) 1 g in sodium chloride 0.9 % 100 mL IVPB (0 g Intravenous Stopped 03/01/21 1217)  iohexol (OMNIPAQUE) 350 MG/ML injection 70 mL (70 mLs Intravenous Contrast Given 03/01/21 1345)  cefTRIAXone (ROCEPHIN) 1 g in sodium chloride 0.9 % 100 mL IVPB (0 g  Intravenous Stopped 03/01/21 1600)  morphine 2 MG/ML injection 2 mg (2 mg Intravenous Given 03/01/21 1520)    ED Course  I have reviewed the triage vital signs and the nursing notes.  Pertinent labs & imaging results that were available during my care of the patient were reviewed by me and considered in my medical decision making (see chart for details).  Clinical Course as of 03/01/21 1653  Fri Mar 01, 2021  1454 Consult to urologist, Dr. Benancio Deeds, to discuss infected nephrolithiasis with pyelonephritis.  He recommends rechecking temperature, patient becomes febrile in the ED will require admission though admission to [facility] long hospital is preferable.  However patient remains afebrile and pain can be managed, she may be discharged home on a flora quinolone such as Cipro twice daily as well as tamsulosin, with strict return precautions.  Patient is able to be discharged, he is requesting close outpatient follow-up on Monday in the office.  I appreciate his collaboration in the care of this patient. [RS]  1618 Patient preference for admission, will administer second gram of Rocephin per urology recommendation.  Hospitalist admission pending at this time. [RS]  1638 Consult to hospitalist, Dr. Jonathon Bellows, who is agreeable to seeing this patient admitting her to his service at [facility].  I appreciate his collaboration in the care of this patient. [RS]    Clinical Course User Index [RS] Levi Crass, Idelia Salm   MDM Rules/Calculators/A&P                         30 year old female presents with concern  for 2 days of bilateral flank pain, abdominal pain, nausea, vomiting, dysuria, urinary frequency and urgency as well as persistent vaginal bleeding 1 week after her menstrual cycle was placed and.  Differential diagnosis includes but is not limited to cystitis, pyelonephritis, PID, STI, colitis, appendicitis, nephrolithiasis, diverticulitis, AAA, sepsis.  Tachycardic and borderline febrile with  temperature 100 F on intake.  Vital signs otherwise normal.  Cardiopulmonary exam is normal, abdominal exam with bilateral CVA tenderness and diffuse generalized abdominal tenderness to light palpation.  Patient is experiencing vaginal bleeding at this time, will perform GU exam once patient is more comfortable.  She is neurovascular intact in all 4 extremities.  Basic labs obtained in triage.  CBC with leukocytosis of 17.8, anemia with hemoglobin of 10.8.  UA with pyuria, hematuria, proteinuria, and nitrate positive.  Patient is not pregnant.  Patient administered dose of IV Rocephin for urinary tract infection.  GU exam performed in presence of chaperone, dilation of the cervical os, with blood in the vaginal vault, tenderness palpation in the right adnexa without fullness or mass.  CT of the abdomen pelvis performed with findings consistent with polycystic kidney disease, family history in the patient's father.  He is currently dialysis dependent.  Patient was also found to have pyelonephritis on the right with 5 mm obstructing stone at the right UVJ with hydroureteronephrosis.  Case discussed with urologist, Dr. Benancio DeedsNewsome as above.  Disposition options discussed with the patient at the bedside.  She expresses strong preference to be admitted by hospital for further pain management and stabilization and possible cystoscopy/removal of stones or stenting of the ureter.  Per urologist patient [facility].  Hospitalist consult pending at this time.     Consult to hospitalist as above. [facility] Colleen Oneill voiced understanding of her medical evaluation and treatment plan. Each of her questions was answered to her expressed satisfaction. She is amenable to plan for admission at this time.    This chart was dictated using voice recognition software, Dragon. Despite the best efforts of this provider to proofread and correct errors, errors may still occur which  can change documentation meaning.  Final Clinical Impression(s) / ED Diagnoses Final diagnoses:  None    Rx / DC Orders ED Discharge Orders     None        Paris LoreSponseller, Akina Maish R, PA-C 03/01/21 1653    Cathren LaineSteinl, Kevin, MD 03/02/21 [phone]

## 2021-03-01 NOTE — Sepsis Progress Note (Signed)
ELink monitoring code sepsis

## 2021-03-02 ENCOUNTER — Encounter (HOSPITAL_COMMUNITY)
Admission: EM | Disposition: A | Discharge status: Home / Self Care | Payer: Self-pay | Source: Home / Self Care | Attending: Internal Medicine

## 2021-03-02 ENCOUNTER — Inpatient Hospital Stay (HOSPITAL_COMMUNITY): Payer: Self-pay

## 2021-03-02 ENCOUNTER — Encounter (HOSPITAL_COMMUNITY): Payer: Self-pay | Admitting: Internal Medicine

## 2021-03-02 ENCOUNTER — Inpatient Hospital Stay (HOSPITAL_COMMUNITY): Payer: Self-pay | Admitting: Certified Registered Nurse Anesthetist

## 2021-03-02 DIAGNOSIS — A419 Sepsis, unspecified organism: Secondary | ICD-10-CM

## 2021-03-02 DIAGNOSIS — N12 Tubulo-interstitial nephritis, not specified as acute or chronic: Secondary | ICD-10-CM

## 2021-03-02 DIAGNOSIS — Q613 Polycystic kidney, unspecified: Secondary | ICD-10-CM

## 2021-03-02 HISTORY — PX: CYSTOSCOPY/URETEROSCOPY/HOLMIUM LASER/STENT PLACEMENT: SHX6546

## 2021-03-02 LAB — COMPREHENSIVE METABOLIC PANEL
ALT: 8 U/L (ref 0–44)
AST: 15 U/L (ref 15–41)
Albumin: 3 g/dL — ABNORMAL LOW (ref 3.5–5.0)
Alkaline Phosphatase: 39 U/L (ref 38–126)
Anion gap: 8 (ref 5–15)
BUN: 17 mg/dL (ref 6–20)
CO2: 25 mmol/L (ref 22–32)
Calcium: 8.8 mg/dL — ABNORMAL LOW (ref 8.9–10.3)
Chloride: 107 mmol/L (ref 98–111)
Creatinine, Ser: 0.89 mg/dL (ref 0.44–1.00)
GFR, Estimated: >60 mL/min (ref >60)
Glucose, Bld: 119 mg/dL — ABNORMAL HIGH (ref 70–99)
Potassium: 4.3 mmol/L (ref 3.5–5.1)
Sodium: 140 mmol/L (ref 135–145)
Total Bilirubin: 0.8 mg/dL (ref 0.3–1.2)
Total Protein: 6.3 g/dL — ABNORMAL LOW (ref 6.5–8.1)

## 2021-03-02 LAB — CBC
HCT: 28.4 % — ABNORMAL LOW (ref 36.0–46.0)
Hemoglobin: 9.3 g/dL — ABNORMAL LOW (ref 12.0–15.0)
MCH: 31.4 pg (ref 26.0–34.0)
MCHC: 32.7 g/dL (ref 30.0–36.0)
MCV: 95.9 fL (ref 80.0–100.0)
Platelets: 206 10*3/uL (ref 150–400)
RBC: 2.96 MIL/uL — ABNORMAL LOW (ref 3.87–5.11)
RDW: 12.5 % (ref 11.5–15.5)
WBC: 15.4 10*3/uL — ABNORMAL HIGH (ref 4.0–10.5)
nRBC: 0 % (ref 0.0–0.2)

## 2021-03-02 LAB — HEMOGLOBIN A1C
Hgb A1c MFr Bld: 5.3 % (ref 4.8–5.6)
Mean Plasma Glucose: 105.41 mg/dL

## 2021-03-02 LAB — HIV ANTIBODY (ROUTINE TESTING W REFLEX): HIV Screen 4th Generation wRfx: NONREACTIVE

## 2021-03-02 SURGERY — CYSTOSCOPY/URETEROSCOPY/HOLMIUM LASER/STENT PLACEMENT
Anesthesia: General | Site: Ureter | Laterality: Right

## 2021-03-02 MED ORDER — ONDANSETRON HCL 4 MG/2ML IJ SOLN
INTRAMUSCULAR | Status: DC | PRN
  Administered 2021-03-02: 4 mg via INTRAVENOUS

## 2021-03-02 MED ORDER — FENTANYL CITRATE (PF) 100 MCG/2ML IJ SOLN
INTRAMUSCULAR | Status: AC
  Filled 2021-03-02: qty 2

## 2021-03-02 MED ORDER — SODIUM CHLORIDE 0.9 % IR SOLN
Status: DC | PRN
  Administered 2021-03-02: 3000 mL via INTRAVESICAL

## 2021-03-02 MED ORDER — OXYCODONE HCL 5 MG PO TABS: 5.0000 mg | ORAL_TABLET | Freq: Once | ORAL | Status: DC | PRN

## 2021-03-02 MED ORDER — SCOPOLAMINE 1 MG/3DAYS TD PT72
MEDICATED_PATCH | TRANSDERMAL | Status: DC | PRN
  Administered 2021-03-02: 1 via TRANSDERMAL

## 2021-03-02 MED ORDER — LIDOCAINE 2% (20 MG/ML) 5 ML SYRINGE
INTRAMUSCULAR | Status: AC
  Filled 2021-03-02: qty 5

## 2021-03-02 MED ORDER — LIDOCAINE 2% (20 MG/ML) 5 ML SYRINGE
INTRAMUSCULAR | Status: DC | PRN
  Administered 2021-03-02: 50 mg via INTRAVENOUS

## 2021-03-02 MED ORDER — SCOPOLAMINE 1 MG/3DAYS TD PT72
MEDICATED_PATCH | TRANSDERMAL | Status: AC
  Filled 2021-03-02: qty 1

## 2021-03-02 MED ORDER — MIDAZOLAM HCL 5 MG/5ML IJ SOLN
INTRAMUSCULAR | Status: DC | PRN
  Administered 2021-03-02: 2 mg via INTRAVENOUS

## 2021-03-02 MED ORDER — AMISULPRIDE (ANTIEMETIC) 5 MG/2ML IV SOLN: 10.0000 mg | Freq: Once | INTRAVENOUS | Status: DC | PRN

## 2021-03-02 MED ORDER — MEPERIDINE HCL 50 MG/ML IJ SOLN: 6.2500 mg | INTRAMUSCULAR | Status: DC | PRN

## 2021-03-02 MED ORDER — ACETAMINOPHEN 10 MG/ML IV SOLN
INTRAVENOUS | Status: AC
  Filled 2021-03-02: qty 100

## 2021-03-02 MED ORDER — ACETAMINOPHEN 10 MG/ML IV SOLN
INTRAVENOUS | Status: DC | PRN
  Administered 2021-03-02: 1000 mg via INTRAVENOUS

## 2021-03-02 MED ORDER — FENTANYL CITRATE (PF) 100 MCG/2ML IJ SOLN
INTRAMUSCULAR | Status: DC | PRN
  Administered 2021-03-02 (×3): 50 ug via INTRAVENOUS

## 2021-03-02 MED ORDER — PROMETHAZINE HCL 25 MG/ML IJ SOLN: 6.2500 mg | INTRAMUSCULAR | Status: DC | PRN

## 2021-03-02 MED ORDER — DEXAMETHASONE SODIUM PHOSPHATE 10 MG/ML IJ SOLN
INTRAMUSCULAR | Status: AC
  Filled 2021-03-02: qty 1

## 2021-03-02 MED ORDER — ONDANSETRON HCL 4 MG/2ML IJ SOLN
INTRAMUSCULAR | Status: AC
  Filled 2021-03-02: qty 2

## 2021-03-02 MED ORDER — PROPOFOL 10 MG/ML IV BOLUS
INTRAVENOUS | Status: DC | PRN
  Administered 2021-03-02: 110 mg via INTRAVENOUS

## 2021-03-02 MED ORDER — OXYCODONE HCL 5 MG/5ML PO SOLN: 5.0000 mg | Freq: Once | ORAL | Status: DC | PRN

## 2021-03-02 MED ORDER — DEXTROSE IN LACTATED RINGERS 5 % IV SOLN: INTRAVENOUS | Status: DC

## 2021-03-02 MED ORDER — MIDAZOLAM HCL 2 MG/2ML IJ SOLN
INTRAMUSCULAR | Status: AC
  Filled 2021-03-02: qty 2

## 2021-03-02 MED ORDER — LACTATED RINGERS IV SOLN: INTRAVENOUS | Status: DC | PRN

## 2021-03-02 MED ORDER — KETOROLAC TROMETHAMINE 30 MG/ML IJ SOLN
INTRAMUSCULAR | Status: DC | PRN
  Administered 2021-03-02: 30 mg via INTRAVENOUS

## 2021-03-02 MED ORDER — PROPOFOL 10 MG/ML IV BOLUS
INTRAVENOUS | Status: AC
  Filled 2021-03-02: qty 20

## 2021-03-02 MED ORDER — HYDROMORPHONE HCL 1 MG/ML IJ SOLN: 0.2500 mg | INTRAMUSCULAR | Status: DC | PRN

## 2021-03-02 MED ORDER — FAMOTIDINE IN NACL 20-0.9 MG/50ML-% IV SOLN
20.0000 mg | INTRAVENOUS | Status: DC
  Administered 2021-03-02: 20 mg via INTRAVENOUS
  Filled 2021-03-02: qty 50

## 2021-03-02 MED ORDER — IOHEXOL 300 MG/ML  SOLN
INTRAMUSCULAR | Status: DC | PRN
  Administered 2021-03-02: 8 mL

## 2021-03-02 MED ORDER — DEXAMETHASONE SODIUM PHOSPHATE 10 MG/ML IJ SOLN
INTRAMUSCULAR | Status: DC | PRN
  Administered 2021-03-02: 10 mg via INTRAVENOUS

## 2021-03-02 MED ORDER — PHENYLEPHRINE 40 MCG/ML (10ML) SYRINGE FOR IV PUSH (FOR BLOOD PRESSURE SUPPORT)
PREFILLED_SYRINGE | INTRAVENOUS | Status: DC | PRN
  Administered 2021-03-02: 80 ug via INTRAVENOUS
  Administered 2021-03-02: 120 ug via INTRAVENOUS

## 2021-03-02 SURGICAL SUPPLY — 23 items
BAG URO CATCHER STRL LF (MISCELLANEOUS) ×2 IMPLANT
BASKET ZERO TIP NITINOL 2.4FR (BASKET) IMPLANT
BSKT STON RTRVL ZERO TP 2.4FR (BASKET)
BULB IRRIG PATHFIND (MISCELLANEOUS) ×2 IMPLANT
CATH URET 5FR 28IN OPEN ENDED (CATHETERS) IMPLANT
CLOTH BEACON ORANGE TIMEOUT ST (SAFETY) ×2 IMPLANT
GLOVE SURG ENC TEXT LTX SZ7.5 (GLOVE) ×2 IMPLANT
GOWN STRL REUS W/TWL XL LVL3 (GOWN DISPOSABLE) ×2 IMPLANT
GUIDEWIRE ANG ZIPWIRE 038X150 (WIRE) IMPLANT
GUIDEWIRE STR DUAL SENSOR (WIRE) ×2 IMPLANT
IV NS 1000ML (IV SOLUTION) ×2
IV NS 1000ML BAXH (IV SOLUTION) ×1 IMPLANT
KIT TURNOVER KIT A (KITS) ×2 IMPLANT
LASER FIB FLEXIVA PULSE ID 365 (Laser) IMPLANT
MANIFOLD NEPTUNE II (INSTRUMENTS) ×2 IMPLANT
PACK CYSTO (CUSTOM PROCEDURE TRAY) ×2 IMPLANT
SHEATH URETERAL 12FRX35CM (MISCELLANEOUS) IMPLANT
STENT URET 6FRX26 CONTOUR (STENTS) ×2 IMPLANT
SYR 20ML LL LF (SYRINGE) ×2 IMPLANT
TRACTIP FLEXIVA PULS ID 200XHI (Laser) IMPLANT
TRACTIP FLEXIVA PULSE ID 200 (Laser)
TUBING CONNECTING 10 (TUBING) ×2 IMPLANT
TUBING UROLOGY SET (TUBING) ×2 IMPLANT

## 2021-03-02 NOTE — Transfer of Care (Signed)
Immediate Anesthesia Transfer of Care Note  Patient: Colleen Oneill  Procedure(s) Performed: CYSTOSCOPY; RIGHT RETROGRADE PYELOGRAM; RIGTH URETERAL STENT PLACEMENT (Right: Ureter)  Patient Location: PACU  Anesthesia Type:General  Level of Consciousness: awake, alert  and oriented  Airway & Oxygen Therapy: Patient Spontanous Breathing and Patient connected to face mask  Post-op Assessment: Report given to RN and Post -op Vital signs reviewed and stable  Post vital signs: Reviewed and stable  Last Vitals:  Vitals Value Taken Time  BP 113/73 03/02/21 1604  Temp 38.6 C 03/02/21 1604  Pulse 94 03/02/21 1613  Resp 24 03/02/21 1613  SpO2 97 % 03/02/21 1613  Vitals shown include unvalidated device data.  Last Pain:  Vitals:   03/02/21 1604  TempSrc:   PainSc: 0-No pain         Complications: No notable events documented.

## 2021-03-02 NOTE — Progress Notes (Signed)
PROGRESS NOTE    Colleen Oneill  ZOX:[id number] DOB: 11-May-1991 DOA: 03/01/2021 PCP: Patient, No Pcp Per (Inactive)    Brief Narrative:  [name] was admitted to the hospital with the working diagnosis of right pyelonephritis, complicated with obstructing stone and hydronephrosis.   30 yo female with recent COVID 19 infection in 08/22, who presented with 2 days of bilateral flank pain.  It was associated with nausea, vomiting and dysuria.  On her initial physical examination her temperature was 37.8 C, blood pressure 119/69, heart rate 103, respiratory rate 20, oxygenation 98% on room air.  Her lungs were clear to auscultation bilaterally, heart S1-S2, present, rhythmic, abdomen soft, right flank tenderness, no lower extremity edema.  Sodium 133, potassium 3.4, chloride 101, bicarb 21, glucose 148, BUN 15, creatinine 0.79, white count 17.8, hemoglobin 10.8, hematocrit 31.8, platelets 256.  Urinalysis specific gravity 1.015, 100 protein, > 50 red cells, > 50 white cells.  CT of the abdomen with polycystic kidney disease, 4-5 mm obstructing stone in the right UVJ with mild upstream hydro ureteronephrosis.  Inflammatory stranding around the right lower pole of the kidney.  Patient has been placed on IV antibiotic and IV fluids, as needed analgesics and antiemetics.  Urology has been consulted.   Assessment & Plan:   Principal Problem:   Acute pyelonephritis Active Problems:   Sepsis (HCC)   Polycystic kidney disease   Anemia, chronic disease   Pyelonephritis   Acute right pyelonephritis complicated with sepsis.  Patient continue to have right flank pain, transitory improvement with IV analgesics, continue to have nausea but not vomiting. She has been NPO.  Wbc continue to be elevated at 15,4 urine culture with gram negative rods > 100,000 CFU (gram negative sepsis).   Plan to continue antibiotic therapy ceftriaxone Pain control with morphine and oxycodone. As needed zofran and  add daily famotidine IV.  Follow up with urology recommendations.  IV fluids with balanced electrolyte solutions with LR at 100 ml per hr (add dextrose)  2. Polycystic renal disease. Stable renal function with serum cr at 0,89 and K at 4,3 with serum bicarbonate at 25. Continue close follow up on renal function and electrolytes.  3. Chronic anemia. Hgb is 9,3 with hct at 28,4 Continue close follow up on hgb and hct.   Patient continue to be at high risk for worsening sepsis   Status is: Inpatient  Remains inpatient appropriate because:Inpatient level of care appropriate due to severity of illness  Dispo: The patient is from: Home              Anticipated d/c is to: Home              Patient currently is not medically stable to d/c.   Difficult to place patient No   DVT prophylaxis: Enoxaparin   Code Status:   full  Family Communication:   No family at the bedside     Consultants:  Urology   Antimicrobials:  Ceftriaxone     Subjective: Patient with continue to have right flank pain, intermittent nausea but not vomiting, she has been NPO   Objective: Vitals:   03/02/21 0010 03/02/21 0648 03/02/21 0658 03/02/21 1214  BP: 105/71 (!) 93/53 (!) 101/57 104/61  Pulse: 80 (!) 104  (!) 107  Resp: 18 16  17   Temp: 98.2 F (36.8 C) 99.5 F (37.5 C)  99.7 F (37.6 C)  TempSrc: Oral Oral  Oral  SpO2: 100% 95%  98%  Weight:  Height:        Intake/Output Summary (Last 24 hours) at 03/02/2021 1346 Last data filed at 03/02/2021 0400 Gross per 24 hour  Intake 1896.88 ml  Output --  Net 1896.88 ml   Filed Weights   03/01/21 0959  Weight: 56.7 kg    Examination:   General: Not in pain or dyspnea, deconditioned  Neurology: Awake and alert, non focal  E ENT: positive pallor, no icterus, oral mucosa is dry.  Cardiovascular: No JVD. S1-S2 present, rhythmic, no gallops, rubs, or murmurs. No lower extremity edema. Pulmonary: positive breath sounds bilaterally, adequate  air movement, no wheezing, rhonchi or rales. Gastrointestinal. Abdomen soft, tender on the right side, positive costovertebral angle tenderness on the right Skin. No rashes Musculoskeletal: no joint deformities     Data Reviewed: I have personally reviewed following labs and imaging studies  CBC: Recent Labs  Lab 03/01/21 1005 03/02/21 0411  WBC 17.8* 15.4*  HGB 10.8* 9.3*  HCT 31.8* 28.4*  MCV 94.6 95.9  PLT 256 206   Basic Metabolic Panel: Recent Labs  Lab 03/01/21 1005 03/02/21 0411  NA 133* 140  K 3.4* 4.3  CL 101 107  CO2 21* 25  GLUCOSE 148* 119*  BUN 15 17  CREATININE 0.79 0.89  CALCIUM 9.1 8.8*   GFR: Estimated Creatinine Clearance: 83.5 mL/min (by C-G formula based on SCr of 0.89 mg/dL). Liver Function Tests: Recent Labs  Lab 03/01/21 1005 03/02/21 0411  AST 14* 15  ALT 8 8  ALKPHOS 40 39  BILITOT 0.7 0.8  PROT 6.8 6.3*  ALBUMIN 3.4* 3.0*   Recent Labs  Lab 03/01/21 1005  LIPASE 20   No results for input(s): AMMONIA in the last 168 hours. Coagulation Profile: No results for input(s): INR, PROTIME in the last 168 hours. Cardiac Enzymes: No results for input(s): CKTOTAL, CKMB, CKMBINDEX, TROPONINI in the last 168 hours. BNP (last 3 results) No results for input(s): PROBNP in the last 8760 hours. HbA1C: Recent Labs    03/02/21 0411  HGBA1C 5.3   CBG: No results for input(s): GLUCAP in the last 168 hours. Lipid Profile: No results for input(s): CHOL, HDL, LDLCALC, TRIG, CHOLHDL, LDLDIRECT in the last 72 hours. Thyroid Function Tests: No results for input(s): TSH, T4TOTAL, FREET4, T3FREE, THYROIDAB in the last 72 hours. Anemia Panel: No results for input(s): VITAMINB12, FOLATE, FERRITIN, TIBC, IRON, RETICCTPCT in the last 72 hours.    Radiology Studies: I have reviewed all of the imaging during this hospital visit personally     Scheduled Meds:  Continuous Infusions:  cefTRIAXone (ROCEPHIN)  IV     lactated ringers 125 mL/hr  at 03/02/21 1300     LOS: 0 days        Montia Haslip Annett Gula, MD

## 2021-03-02 NOTE — Op Note (Addendum)
Preoperative diagnosis:  1.  Right distal ureteral calculus with urosepsis  Postoperative diagnosis: 1.  Same  Procedure(s): 1.  Cystoscopy, right retrograde pyelogram with intraoperative interpretation, insertion right JJ stent  Surgeon: Dr. Karoline Caldwell  Anesthesia: General  Complications: None  EBL: Minimal  Specimens: None  Disposition of specimens: Not applicable  Intraoperative findings: High-grade obstruction distal ureter from 5 to 6 mm stone, urine very cloudy with brisk flow after passing guidewire.  6 [place] by 26 cm soft contour stent placed without tether  Indication: 30 year old African-American female presented with acute onset right-sided flank pain and low-grade fever and UTI.  Was found to have 5 mm obstructing right distal ureteral calculus.  She is persisted having pain and also is febrile to 101.5 at time of procedure.  Presents at this time to undergo cystoscopy insertion of right JJ stent  Description of procedure:  After obtaining informed consent of the patient she was taken the major cystoscopy suite placed under general anesthesia.  Placed in the dorsolithotomy position genitalia prepped and draped in usual sterile fashion.  Proper pause timeout was performed for site of procedure.  21 Fransico was advanced into the bladder.  Bladder appeared grossly normal without lesions.  Right ureteral orifice was identified and utilizing a 5 [place] open tip catheter I guided a sensor wire just inside the right ureteral orifice with almost immediate flow of cloudy urine.  The open tip cath was advanced just inside the right ureteral orifice and subsequent gentle retrograde was performed showed marked dilatation of the ureter up to the renal pelvis.  I then passed a sensor wire up to the renal pelvis and remove the open tip catheter.  A 6 French by 26 cm Percuflex plus soft Contour stent was placed leaving a proximal coil in the renal pelvis and a distal coil in the  bladder.  There was brisk flow of cloudy urine through and around the stent noted.  Bladder was emptied procedure terminated.  She was awakened from anesthesia and taken back to the recovery room in stable but guarded condition.  The patient was febrile to almost 102 at the time of procedure but vital signs remained stable throughout the case. The patient will need definitive ureteroscopy and laser lithotripsy and stone extraction once medically stable after antibiotics.  Most likely 2 weeks or more from now.

## 2021-03-02 NOTE — Anesthesia Procedure Notes (Signed)
Procedure Name: LMA Insertion Date/Time: 03/02/2021 3:39 PM Performed by: Sudie Grumbling, CRNA Pre-anesthesia Checklist: Patient identified, Emergency Drugs available, Suction available and Patient being monitored Patient Re-evaluated:Patient Re-evaluated prior to induction Oxygen Delivery Method: Circle system utilized Preoxygenation: Pre-oxygenation with 100% oxygen Induction Type: IV induction LMA: LMA with gastric port inserted LMA Size: 4.0 Tube type: Oral Number of attempts: 1 Placement Confirmation: positive ETCO2 and breath sounds checked- equal and bilateral Tube secured with: Tape Dental Injury: Teeth and Oropharynx as per pre-operative assessment

## 2021-03-02 NOTE — Anesthesia Preprocedure Evaluation (Addendum)
Anesthesia Evaluation  Patient identified by MRN, date of birth, ID band Patient awake    Reviewed: Allergy & Precautions, NPO status , Patient's Chart, lab work & pertinent test results  Airway Mallampati: I  TM Distance: >3 FB Neck ROM: Full    Dental  (+) Teeth Intact, Dental Advisory Given, Chipped,    Pulmonary  COVID 8/22   Pulmonary exam normal breath sounds clear to auscultation       Cardiovascular negative cardio ROS Normal cardiovascular exam Rhythm:Regular Rate:Normal     Neuro/Psych negative neurological ROS  negative psych ROS   GI/Hepatic negative GI ROS, Neg liver ROS,   Endo/Other  negative endocrine ROS  Renal/GU Renal disease (right ureteral stone )Sepsis 2/2 renal stone, hydronephrosis   negative genitourinary   Musculoskeletal negative musculoskeletal ROS (+)   Abdominal   Peds negative pediatric ROS (+)  Hematology  (+) Blood dyscrasia, anemia , 9.3/28.4   Anesthesia Other Findings   Reproductive/Obstetrics negative OB ROS                            Anesthesia Physical Anesthesia Plan  ASA: 3  Anesthesia Plan: General   Post-op Pain Management:    Induction: Intravenous  PONV Risk Score and Plan: 4 or greater and Ondansetron, Dexamethasone, Midazolam, Treatment may vary due to age or medical condition and Scopolamine patch - Pre-op  Airway Management Planned: LMA  Additional Equipment: None  Intra-op Plan:   Post-operative Plan: Extubation in OR  Informed Consent: I have reviewed the patients History and Physical, chart, labs and discussed the procedure including the risks, benefits and alternatives for the proposed anesthesia with the patient or authorized representative who has indicated his/her understanding and acceptance.     Dental advisory given  Plan Discussed with: CRNA  Anesthesia Plan Comments:         Anesthesia Quick  Evaluation

## 2021-03-02 NOTE — Consult Note (Signed)
Urology Consult   Physician requesting consult: Reuel Boom  Reason for consult: Right ureteral calculus  History of Present Illness: Colleen Oneill is a 30 y.o. African-American female presented to the emergency room with cute onset of flank pain.  She states she had had 2 days of bilateral flank pain with associated nausea vomiting and dysuria.  Patient subsequently seen in the emergency room noted to have low-grade fever white count of 15,000 and a CT scan showed a 5 mm right ureterovesical junction stone with mild upstream hydroureteronephrosis and evidence of probable polycystic kidney disease.  See report below.  She was admitted for IV antibiotics has had continued discomfort on the right side.  Urine culture growing gram-negative rods.  She denies a history of voiding or storage urinary symptoms, hematuria, UTIs, STDs, urolithiasis, GU malignancy/trauma/surgery.  Past Medical History:  Diagnosis Date   Medical history non-contributory     Past Surgical History:  Procedure Laterality Date   CESAREAN SECTION     CESAREAN SECTION N/A 08/09/2012   Procedure: Repeat CESAREAN SECTION of baby boy  at 0431  APGAR 9/9;  Surgeon: Kathreen Cosier, MD;  Location: [facility];  Service: Obstetrics;  Laterality: N/A;   CESAREAN SECTION N/A 08/23/2013   Procedure: CESAREAN SECTION;  Surgeon: Brock Bad, MD;  Location: [facility];  Service: Obstetrics;  Laterality: N/A;     Current Hospital Medications:  Home meds:  No current facility-administered medications on file prior to encounter.   Current Outpatient Medications on File Prior to Encounter  Medication Sig Dispense Refill   acetaminophen (TYLENOL) 500 MG tablet Take 1,000 mg by mouth every 6 (six) hours as needed for mild pain.     ibuprofen (ADVIL) 200 MG tablet Take 600 mg by mouth every 6 (six) hours as needed for mild pain.     ondansetron (ZOFRAN ODT) 4 MG disintegrating tablet Take 1 tablet (4 mg total) by mouth every 8 (eight) hours as  needed for nausea or vomiting. 20 tablet 0     Scheduled Meds: Continuous Infusions:  cefTRIAXone (ROCEPHIN)  IV 2 g (03/02/21 1424)   dextrose 5% lactated ringers     famotidine (PEPCID) IV     PRN Meds:.acetaminophen **OR** acetaminophen, morphine injection, ondansetron **OR** ondansetron (ZOFRAN) IV, oxyCODONE  Allergies: No Known Allergies  Family History  Problem Relation Age of Onset   Kidney disease Father    Diabetes Maternal Grandmother    Other Neg Hx     Social History:  reports that she has never smoked. She has never used smokeless tobacco. She reports that she does not drink alcohol and does not use drugs.  ROS: A complete review of systems was performed.  All systems are negative except for pertinent findings as noted.  Physical Exam:  Vital signs in last 24 hours: Temp:  [98.2 F (36.8 C)-99.7 F (37.6 C)] 99.7 F (37.6 C) (09/10 1214) Pulse Rate:  [80-111] 107 (09/10 1214) Resp:  [16-23] 17 (09/10 1214) BP: (93-109)/(53-76) 104/61 (09/10 1214) SpO2:  [95 %-100 %] 98 % (09/10 1214) Constitutional:  Alert and oriented, No acute distress Cardiovascular: Regular rate and rhythm, No JVD Respiratory: Normal respiratory effort, Lungs clear bilaterally GI: Abdomen is soft, nontender, nondistended, no abdominal masses GU: No CVA tenderness Lymphatic: No lymphadenopathy Neurologic: Grossly intact, no focal deficits Psychiatric: Normal mood and affect  Laboratory Data:  Recent Labs    03/01/21 1005 03/02/21 0411  WBC 17.8* 15.4*  HGB 10.8* 9.3*  HCT 31.8* 28.4*  PLT 256 206    Recent Labs    03/01/21 1005 03/02/21 0411  NA 133* 140  K 3.4* 4.3  CL 101 107  GLUCOSE 148* 119*  BUN 15 17  CALCIUM 9.1 8.8*  CREATININE 0.79 0.89     Results for orders placed or performed during the hospital encounter of 03/01/21 (from the past 24 hour(s))  Urine Culture     Status: Abnormal (Preliminary result)   Collection Time: 03/01/21  3:13 PM   Specimen:  Urine, Clean Catch  Result Value Ref Range   Specimen Description URINE, CLEAN CATCH    Special Requests NONE    Culture (A)     >=100,000 COLONIES/mL GRAM NEGATIVE RODS SUSCEPTIBILITIES TO FOLLOW Performed at [facility], [address], [place], [place] [address]    Report Status PENDING   Lactic acid, plasma     Status: None   Collection Time: 03/01/21  8:46 PM  Result Value Ref Range   Lactic Acid, Venous 1.2 0.5 - 1.9 mmol/L  HIV Antibody (routine testing w rflx)     Status: None   Collection Time: 03/01/21  8:46 PM  Result Value Ref Range   HIV Screen 4th Generation wRfx Non Reactive Non Reactive  Comprehensive metabolic panel     Status: Abnormal   Collection Time: 03/02/21  4:11 AM  Result Value Ref Range   Sodium 140 135 - 145 mmol/L   Potassium 4.3 3.5 - 5.1 mmol/L   Chloride 107 98 - 111 mmol/L   CO2 25 22 - 32 mmol/L   Glucose, Bld 119 (H) 70 - 99 mg/dL   BUN 17 6 - 20 mg/dL   Creatinine, Ser 4.76 0.44 - 1.00 mg/dL   Calcium 8.8 (L) 8.9 - 10.3 mg/dL   Total Protein 6.3 (L) 6.5 - 8.1 g/dL   Albumin 3.0 (L) 3.5 - 5.0 g/dL   AST 15 15 - 41 U/L   ALT 8 0 - 44 U/L   Alkaline Phosphatase 39 38 - 126 U/L   Total Bilirubin 0.8 0.3 - 1.2 mg/dL   GFR, Estimated >54 >65 mL/min   Anion gap 8 5 - 15  CBC     Status: Abnormal   Collection Time: 03/02/21  4:11 AM  Result Value Ref Range   WBC 15.4 (H) 4.0 - 10.5 K/uL   RBC 2.96 (L) 3.87 - 5.11 MIL/uL   Hemoglobin 9.3 (L) 12.0 - 15.0 g/dL   HCT 03.5 (L) 46.5 - 68.1 %   MCV 95.9 80.0 - 100.0 fL   MCH 31.4 26.0 - 34.0 pg   MCHC 32.7 30.0 - 36.0 g/dL   RDW 27.5 17.0 - 01.7 %   Platelets 206 150 - 400 K/uL   nRBC 0.0 0.0 - 0.2 %  Hemoglobin A1c     Status: None   Collection Time: 03/02/21  4:11 AM  Result Value Ref Range   Hgb A1c MFr Bld 5.3 4.8 - 5.6 %   Mean Plasma Glucose 105.41 mg/dL   Recent Results (from the past 240 hour(s))  Wet prep, genital     Status: Abnormal   Collection Time: 03/01/21  1:31 PM   Result Value Ref Range Status   Yeast Wet Prep HPF POC NONE SEEN NONE SEEN Final    Comment: Specimen diluted due to transport tube containing more than 1 ml of saline, interpret results with caution.   Trich, Wet Prep NONE SEEN NONE SEEN Final   Clue Cells  Wet Prep HPF POC NONE SEEN NONE SEEN Final   WBC, Wet Prep HPF POC FEW (A) NONE SEEN Final   Sperm NONE SEEN  Final    Comment: Performed at [facility], [address], [place], [place] [address]  Urine Culture     Status: Abnormal (Preliminary result)   Collection Time: 03/01/21  3:13 PM   Specimen: Urine, Clean Catch  Result Value Ref Range Status   Specimen Description URINE, CLEAN CATCH  Final   Special Requests NONE  Final   Culture (A)  Final    >=100,000 COLONIES/mL GRAM NEGATIVE RODS SUSCEPTIBILITIES TO FOLLOW Performed at [facility], [address], [place], [place] [address]    Report Status PENDING  Incomplete    Renal Function: Recent Labs    03/01/21 1005 03/02/21 0411  CREATININE 0.79 0.89   Estimated Creatinine Clearance: 83.5 mL/min (by C-G formula based on SCr of 0.89 mg/dL).  Radiologic Imaging: CT Abdomen Pelvis W Contrast  Result Date: 03/01/2021 CLINICAL DATA:  Abdominal pain for 2 days EXAM: CT ABDOMEN AND PELVIS WITH CONTRAST TECHNIQUE: Multidetector CT imaging of the abdomen and pelvis was performed using the standard protocol following bolus administration of intravenous contrast. CONTRAST:  93mL OMNIPAQUE IOHEXOL 350 MG/ML SOLN COMPARISON:  None. FINDINGS: Lower chest: The lung bases are clear. Hepatobiliary: There are numerous cysts throughout the liver. There are no enhancing lesions. The gallbladder is unremarkable. There is no biliary ductal dilatation. Pancreas: Unremarkable. Spleen: Unremarkable. Adrenals/Urinary Tract: The adrenals are unremarkable. The kidneys are enlarged measuring up to 15.2 cm in length on the left and 17.0 cm on the right. There are innumerable cysts  throughout both kidneys consistent with polycystic kidney disease. There are small nonobstructing left renal stones. There is no left hydronephrosis. There is a 5 mm pocket obstructing stone at the right UVJ with mild upstream hydroureteronephrosis. There is mild stranding in the perinephric fat adjacent to the right lower pole. There is possible relative hypoenhancement of the thinned renal parenchyma in the right lower pole (7-41). The bladder is decompressed but grossly unremarkable. Stomach/Bowel: The stomach is unremarkable. There is no evidence of bowel obstruction. There is no evidence of bowel obstruction. There is no abnormal bowel wall thickening or inflammatory change. The tentatively identified appendix is normal (3-63). Vascular/Lymphatic: The abdominal aorta is normal in course and caliber. The main portal and splenic veins are patent. There is no abdominal or pelvic lymphadenopathy. Reproductive: The uterus and adnexa are unremarkable. Other: There is trace free fluid in the pelvis which may be reactive or physiologic. Musculoskeletal: There is no acute osseous abnormality. IMPRESSION: 1. Findings above consistent with polycystic kidney disease. 2. 4-5 mm obstructing stone at the right UVJ with mild upstream hydroureteronephrosis. Inflammatory stranding around the right lower pole which may be relatively hypoenhancing raises suspicion for pyelonephritis. No definite evidence of superinfection of a renal cyst. 3. Small nonobstructing left renal stones. Electronically Signed   By: Lesia Hausen M.D.   On: 03/01/2021 14:23    I independently reviewed the above imaging studies.  Impression/Recommendation: Right UVJ stone with associated UTI Plan/recommendation: Plan for cystoscopy at least insertion of right JJ stent.  If stone is at UVJ may need laser lithotripsy or stenting.  The benefits procedure discussed with the patient we will schedule accordingly later this afternoon  Belva Agee 03/02/2021, 2:38 PM     CC:

## 2021-03-02 NOTE — Anesthesia Postprocedure Evaluation (Signed)
Anesthesia Post Note  Patient: MYNDI WAMBLE  Procedure(s) Performed: CYSTOSCOPY; RIGHT RETROGRADE PYELOGRAM; RIGTH URETERAL STENT PLACEMENT (Right: Ureter)     Patient location during evaluation: PACU Anesthesia Type: General Level of consciousness: awake and alert, oriented and patient cooperative Pain management: pain level controlled Vital Signs Assessment: post-procedure vital signs reviewed and stable Respiratory status: spontaneous breathing, nonlabored ventilation and respiratory function stable Cardiovascular status: blood pressure returned to baseline and stable Postop Assessment: no apparent nausea or vomiting Anesthetic complications: no   No notable events documented.  Last Vitals:  Vitals:   03/02/21 1604 03/02/21 1615  BP: 113/73 111/65  Pulse: 95 89  Resp: (!) 24 14  Temp: (!) 38.6 C   SpO2: 98% 97%    Last Pain:  Vitals:   03/02/21 1604  TempSrc:   PainSc: 0-No pain                 Lannie Fields

## 2021-03-03 ENCOUNTER — Encounter (HOSPITAL_COMMUNITY): Payer: Self-pay | Admitting: Urology

## 2021-03-03 DIAGNOSIS — D638 Anemia in other chronic diseases classified elsewhere: Secondary | ICD-10-CM

## 2021-03-03 LAB — CBC WITH DIFFERENTIAL/PLATELET
Abs Immature Granulocytes: 0.07 10*3/uL (ref 0.00–0.07)
Basophils Absolute: 0 10*3/uL (ref 0.0–0.1)
Basophils Relative: 0 %
Eosinophils Absolute: 0 10*3/uL (ref 0.0–0.5)
Eosinophils Relative: 0 %
HCT: 34.3 % — ABNORMAL LOW (ref 36.0–46.0)
Hemoglobin: 11.4 g/dL — ABNORMAL LOW (ref 12.0–15.0)
Immature Granulocytes: 1 %
Lymphocytes Relative: 5 %
Lymphs Abs: 0.5 10*3/uL — ABNORMAL LOW (ref 0.7–4.0)
MCH: 31.7 pg (ref 26.0–34.0)
MCHC: 33.2 g/dL (ref 30.0–36.0)
MCV: 95.3 fL (ref 80.0–100.0)
Monocytes Absolute: 0.3 10*3/uL (ref 0.1–1.0)
Monocytes Relative: 3 %
Neutro Abs: 9.6 10*3/uL — ABNORMAL HIGH (ref 1.7–7.7)
Neutrophils Relative %: 91 %
Platelets: 188 10*3/uL (ref 150–400)
RBC: 3.6 MIL/uL — ABNORMAL LOW (ref 3.87–5.11)
RDW: 12.3 % (ref 11.5–15.5)
WBC: 10.5 10*3/uL (ref 4.0–10.5)
nRBC: 0 % (ref 0.0–0.2)

## 2021-03-03 LAB — BASIC METABOLIC PANEL
Anion gap: 9 (ref 5–15)
BUN: 19 mg/dL (ref 6–20)
CO2: 25 mmol/L (ref 22–32)
Calcium: 9.1 mg/dL (ref 8.9–10.3)
Chloride: 105 mmol/L (ref 98–111)
Creatinine, Ser: 0.5 mg/dL (ref 0.44–1.00)
GFR, Estimated: >60 mL/min (ref >60)
Glucose, Bld: 140 mg/dL — ABNORMAL HIGH (ref 70–99)
Potassium: 4.1 mmol/L (ref 3.5–5.1)
Sodium: 139 mmol/L (ref 135–145)

## 2021-03-03 LAB — URINE CULTURE: Culture: >=100000 — AB

## 2021-03-03 MED ORDER — MAGNESIUM HYDROXIDE 400 MG/5ML PO SUSP
30.0000 mL | Freq: Every day | ORAL | Status: DC | PRN
  Administered 2021-03-03: 30 mL via ORAL
  Filled 2021-03-03: qty 30

## 2021-03-03 MED ORDER — PANTOPRAZOLE SODIUM 40 MG PO TBEC
40.0000 mg | DELAYED_RELEASE_TABLET | Freq: Every day | ORAL | Status: DC
  Administered 2021-03-03 – 2021-03-04 (×2): 40 mg via ORAL
  Filled 2021-03-03 (×2): qty 1

## 2021-03-03 MED ORDER — CEFAZOLIN SODIUM-DEXTROSE 1-4 GM/50ML-% IV SOLN
1.0000 g | Freq: Three times a day (TID) | INTRAVENOUS | Status: DC
  Administered 2021-03-03 – 2021-03-04 (×3): 1 g via INTRAVENOUS
  Filled 2021-03-03 (×4): qty 50

## 2021-03-03 NOTE — Progress Notes (Signed)
Pt reports no insurance,trying to see if she can be enrolled to medicaid -reports bloatedness going on for months now -constipation,last bowel movement was 4 days ago, milk of magnesia given. -also reports sweats when waking up this morning, patient is afebrile, no headache,lightheadedness. Informed patient to report when it happens again.

## 2021-03-03 NOTE — Progress Notes (Signed)
PROGRESS NOTE    Colleen Oneill  ZOX:[id number] DOB: 1990-11-07 DOA: 03/01/2021 PCP: Patient, No Pcp Per (Inactive)    Brief Narrative:  Colleen Oneill was admitted to the hospital with the working diagnosis of right pyelonephritis, complicated with obstructing stone and hydronephrosis.    30 yo female with recent COVID 19 infection in 08/22, who presented with 2 days of bilateral flank pain.  It was associated with nausea, vomiting and dysuria.  On her initial physical examination her temperature was 37.8 C, blood pressure 119/69, heart rate 103, respiratory rate 20, oxygenation 98% on room air.  Her lungs were clear to auscultation bilaterally, heart S1-S2, present, rhythmic, abdomen soft, right flank tenderness, no lower extremity edema.   Sodium 133, potassium 3.4, chloride 101, bicarb 21, glucose 148, BUN 15, creatinine 0.79, white count 17.8, hemoglobin 10.8, hematocrit 31.8, platelets 256.   Urinalysis specific gravity 1.015, 100 protein, > 50 red cells, > 50 white cells.   CT of the abdomen with polycystic kidney disease, 4-5 mm obstructing stone in the right UVJ with mild upstream hydro ureteronephrosis.  Inflammatory stranding around the right lower pole of the kidney.   Patient has been placed on IV antibiotic and IV fluids, as needed analgesics and antiemetics.   Urology has been consulted and patient underwent cystoscopy with right retrograde pyelogram with insertion of right JJ stent.   Patient will need lase lithotripsy on 10 to 14 days.   Assessment & Plan:   Principal Problem:   Acute pyelonephritis Active Problems:   Sepsis (HCC)   Polycystic kidney disease   Anemia, chronic disease   Pyelonephritis   Acute right pyelonephritis complicated with sepsis.  Her abdominal pain has improved but not yet back to baseline.  Now sp post right JJ stent placement.   Wbc is 10.5.  Urine culture positive for E coli pansensitive.    Change antibiotic therapy to cefazolin and  plan to discharge patient on PO cephalexin  Continue control with morphine and oxycodone. Advance diet as tolerated, out of bed to chair tid and ambulate in the hallway.  Discontinue telemetry.    2. Polycystic renal disease.  Renal function with serum cr at 0,50 with K at 4,1 and serum bicarbonate at 25. Continue close follow up on renal function and electrolytes. Discontinue IV fluids.    3. Chronic anemia. Cell count has been stable, no macroscopic hematuria. Hgb is 11,4 and hct is 34.3     Status is: Inpatient  Remains inpatient appropriate because:Inpatient level of care appropriate due to severity of illness  Dispo: The patient is from: Home              Anticipated d/c is to: Home              Patient currently is not medically stable to d/c.   Difficult to place patient No  DVT prophylaxis: Enoxaparin   Code Status:    full  Family Communication:   No family at the bedside     Consultants:  Urology   Procedures:  Cystoscopy and JJ stent placement on the right.   Antimicrobials:  Ceftriaxone change to cefazolin     Subjective: Patient with improved right side pain but not yet back to baseline, worse with movement, no nausea or vomiting,   Objective: Vitals:   03/02/21 1645 03/02/21 2031 03/03/21 0505 03/03/21 0600  BP: 109/67 99/67 101/61   Pulse: 80 68 63 (!) 48  Resp: (!) 25 16 16  Temp: 99.5 F (37.5 C) 98.2 F (36.8 C) 98 F (36.7 C)   TempSrc:  Oral Oral   SpO2: 97% 100% 95%   Weight:      Height:        Intake/Output Summary (Last 24 hours) at 03/03/2021 1110 Last data filed at 03/03/2021 0644 Gross per 24 hour  Intake 702.31 ml  Output 400 ml  Net 302.31 ml   Filed Weights   03/01/21 0959  Weight: 56.7 kg    Examination:   General: Not in pain or dyspnea, deconditioned  Neurology: Awake and alert, non focal  E ENT: positive pallor, no icterus, oral mucosa moist Cardiovascular: No JVD. S1-S2 present, rhythmic, no gallops, rubs, or  murmurs. No lower extremity edema. Pulmonary:positive breath sounds bilaterally, adequate air movement, no wheezing, rhonchi or rales. Gastrointestinal. Abdomen  soft and non tender to superficial palpation Skin. No rashes Musculoskeletal: no joint deformities     Data Reviewed: I have personally reviewed following labs and imaging studies  CBC: Recent Labs  Lab 03/01/21 1005 03/02/21 0411 03/03/21 0431  WBC 17.8* 15.4* 10.5  NEUTROABS  --   --  9.6*  HGB 10.8* 9.3* 11.4*  HCT 31.8* 28.4* 34.3*  MCV 94.6 95.9 95.3  PLT 256 206 188   Basic Metabolic Panel: Recent Labs  Lab 03/01/21 1005 03/02/21 0411 03/03/21 0431  NA 133* 140 139  K 3.4* 4.3 4.1  CL 101 107 105  CO2 21* 25 25  GLUCOSE 148* 119* 140*  BUN 15 17 19   CREATININE 0.79 0.89 0.50  CALCIUM 9.1 8.8* 9.1   GFR: Estimated Creatinine Clearance: 92.9 mL/min (by C-G formula based on SCr of 0.5 mg/dL). Liver Function Tests: Recent Labs  Lab 03/01/21 1005 03/02/21 0411  AST 14* 15  ALT 8 8  ALKPHOS 40 39  BILITOT 0.7 0.8  PROT 6.8 6.3*  ALBUMIN 3.4* 3.0*   Recent Labs  Lab 03/01/21 1005  LIPASE 20   No results for input(s): AMMONIA in the last 168 hours. Coagulation Profile: No results for input(s): INR, PROTIME in the last 168 hours. Cardiac Enzymes: No results for input(s): CKTOTAL, CKMB, CKMBINDEX, TROPONINI in the last 168 hours. BNP (last 3 results) No results for input(s): PROBNP in the last 8760 hours. HbA1C: Recent Labs    03/02/21 0411  HGBA1C 5.3   CBG: No results for input(s): GLUCAP in the last 168 hours. Lipid Profile: No results for input(s): CHOL, HDL, LDLCALC, TRIG, CHOLHDL, LDLDIRECT in the last 72 hours. Thyroid Function Tests: No results for input(s): TSH, T4TOTAL, FREET4, T3FREE, THYROIDAB in the last 72 hours. Anemia Panel: No results for input(s): VITAMINB12, FOLATE, FERRITIN, TIBC, IRON, RETICCTPCT in the last 72 hours.    Radiology Studies: I have reviewed all  of the imaging during this hospital visit personally     Scheduled Meds:  Continuous Infusions:  cefTRIAXone (ROCEPHIN)  IV 2 g (03/02/21 1424)   dextrose 5% lactated ringers 100 mL/hr at 03/02/21 1802   famotidine (PEPCID) IV 20 mg (03/02/21 1803)     LOS: 1 day        Colleen Oneill 05/02/21, MD

## 2021-03-03 NOTE — Progress Notes (Signed)
1 Day Post-Op Subjective: Patient feeling better today has some right-sided pain likely related to the stent.  Afebrile overnight.  White blood cell count now 10.  Creatinine 0.5.  Urine C&S growing E. coli sensitivities pending.  Objective: Vital signs in last 24 hours: Temp:  [98 F (36.7 C)-101.5 F (38.6 C)] 98 F (36.7 C) (09/11 0505) Pulse Rate:  [48-107] 48 (09/11 0600) Resp:  [14-25] 16 (09/11 0505) BP: (99-113)/(61-73) 101/61 (09/11 0505) SpO2:  [95 %-100 %] 95 % (09/11 0505)  Intake/Output from previous day: 09/10 0701 - 09/11 0700 In: 702.3 [I.V.:702.3] Out: 400 [Urine:400] Intake/Output this shift: No intake/output data recorded.  Physical Exam:  General: Alert and oriented   Lab Results: Recent Labs    03/01/21 1005 03/02/21 0411 03/03/21 0431  HGB 10.8* 9.3* 11.4*  HCT 31.8* 28.4* 34.3*   BMET Recent Labs    03/02/21 0411 03/03/21 0431  NA 140 139  K 4.3 4.1  CL 107 105  CO2 25 25  GLUCOSE 119* 140*  BUN 17 19  CREATININE 0.89 0.50  CALCIUM 8.8* 9.1     Studies/Results: CT Abdomen Pelvis W Contrast  Result Date: 03/01/2021 CLINICAL DATA:  Abdominal pain for 2 days EXAM: CT ABDOMEN AND PELVIS WITH CONTRAST TECHNIQUE: Multidetector CT imaging of the abdomen and pelvis was performed using the standard protocol following bolus administration of intravenous contrast. CONTRAST:  50mL OMNIPAQUE IOHEXOL 350 MG/ML SOLN COMPARISON:  None. FINDINGS: Lower chest: The lung bases are clear. Hepatobiliary: There are numerous cysts throughout the liver. There are no enhancing lesions. The gallbladder is unremarkable. There is no biliary ductal dilatation. Pancreas: Unremarkable. Spleen: Unremarkable. Adrenals/Urinary Tract: The adrenals are unremarkable. The kidneys are enlarged measuring up to 15.2 cm in length on the left and 17.0 cm on the right. There are innumerable cysts throughout both kidneys consistent with polycystic kidney disease. There are small  nonobstructing left renal stones. There is no left hydronephrosis. There is a 5 mm pocket obstructing stone at the right UVJ with mild upstream hydroureteronephrosis. There is mild stranding in the perinephric fat adjacent to the right lower pole. There is possible relative hypoenhancement of the thinned renal parenchyma in the right lower pole (7-41). The bladder is decompressed but grossly unremarkable. Stomach/Bowel: The stomach is unremarkable. There is no evidence of bowel obstruction. There is no evidence of bowel obstruction. There is no abnormal bowel wall thickening or inflammatory change. The tentatively identified appendix is normal (3-63). Vascular/Lymphatic: The abdominal aorta is normal in course and caliber. The main portal and splenic veins are patent. There is no abdominal or pelvic lymphadenopathy. Reproductive: The uterus and adnexa are unremarkable. Other: There is trace free fluid in the pelvis which may be reactive or physiologic. Musculoskeletal: There is no acute osseous abnormality. IMPRESSION: 1. Findings above consistent with polycystic kidney disease. 2. 4-5 mm obstructing stone at the right UVJ with mild upstream hydroureteronephrosis. Inflammatory stranding around the right lower pole which may be relatively hypoenhancing raises suspicion for pyelonephritis. No definite evidence of superinfection of a renal cyst. 3. Small nonobstructing left renal stones. Electronically Signed   By: Lesia Hausen M.D.   On: 03/01/2021 14:23   DG C-Arm 1-60 Min-No Report  Result Date: 03/02/2021 Fluoroscopy was utilized by the requesting physician.  No radiographic interpretation.    Assessment/Plan: 1.  Right ureteral calculus with UTI/early sepsis, improving status post cystoscopy stent 03/02/2021 Plan/recommendation: Spoke with patient regarding findings yesterday and necessity for stent and will need definitive  management for her ureteral calculus with ureteroscopy and laser lithotripsy in 10  to 14 days.  We will await urine C&S and hopefully be able to transition to oral antibiotic such as quinolone for discharge home.  Would recommend probably 1 more day of antibiotic and monitor for fever today and await full sensitivities before discharge home.  We will arrange for outpatient follow-up definitive management of her stone.    LOS: 1 day   Belva Agee 03/03/2021, 7:39 AM

## 2021-03-04 ENCOUNTER — Other Ambulatory Visit: Payer: Self-pay

## 2021-03-04 ENCOUNTER — Encounter (HOSPITAL_COMMUNITY): Payer: Self-pay | Admitting: Internal Medicine

## 2021-03-04 LAB — GC/CHLAMYDIA PROBE AMP (~~LOC~~) NOT AT ARMC
Chlamydia: NEGATIVE
Comment: NEGATIVE
Comment: NORMAL
Neisseria Gonorrhea: NEGATIVE

## 2021-03-04 LAB — CBC
HCT: 27.6 % — ABNORMAL LOW (ref 36.0–46.0)
Hemoglobin: 9.1 g/dL — ABNORMAL LOW (ref 12.0–15.0)
MCH: 31.1 pg (ref 26.0–34.0)
MCHC: 33 g/dL (ref 30.0–36.0)
MCV: 94.2 fL (ref 80.0–100.0)
Platelets: 243 10*3/uL (ref 150–400)
RBC: 2.93 MIL/uL — ABNORMAL LOW (ref 3.87–5.11)
RDW: 12.5 % (ref 11.5–15.5)
WBC: 13.3 10*3/uL — ABNORMAL HIGH (ref 4.0–10.5)
nRBC: 0 % (ref 0.0–0.2)

## 2021-03-04 MED ORDER — TRAMADOL HCL 50 MG PO TABS
50.0000 mg | ORAL_TABLET | Freq: Four times a day (QID) | ORAL | 0 refills | Status: DC | PRN
  Filled 2021-03-04: qty 10, 3d supply, fill #0

## 2021-03-04 MED ORDER — ACETAMINOPHEN 325 MG PO TABS: 650.0000 mg | ORAL_TABLET | Freq: Four times a day (QID) | ORAL | Status: DC | PRN

## 2021-03-04 MED ORDER — CIPROFLOXACIN HCL 500 MG PO TABS
500.0000 mg | ORAL_TABLET | Freq: Two times a day (BID) | ORAL | 0 refills | Status: AC
  Filled 2021-03-04: qty 20, 10d supply, fill #0

## 2021-03-04 MED ORDER — TRAMADOL HCL 50 MG PO TABS
50.0000 mg | ORAL_TABLET | Freq: Four times a day (QID) | ORAL | Status: DC | PRN
Start: 2021-03-04 — End: 2021-03-04

## 2021-03-04 MED ORDER — TAMSULOSIN HCL 0.4 MG PO CAPS
0.4000 mg | ORAL_CAPSULE | Freq: Every day | ORAL | 0 refills | Status: AC
  Filled 2021-03-04: qty 15, 15d supply, fill #0

## 2021-03-04 MED ORDER — TAMSULOSIN HCL 0.4 MG PO CAPS
0.4000 mg | ORAL_CAPSULE | Freq: Every day | ORAL | Status: DC
  Administered 2021-03-04: 0.4 mg via ORAL
  Filled 2021-03-04: qty 1

## 2021-03-04 MED ORDER — CIPROFLOXACIN HCL 500 MG PO TABS
500.0000 mg | ORAL_TABLET | Freq: Two times a day (BID) | ORAL | Status: DC
  Administered 2021-03-04: 500 mg via ORAL
  Filled 2021-03-04: qty 1

## 2021-03-04 NOTE — Progress Notes (Signed)
Patient discharged via wheelchair. Patient left in stable condition with her dad. Went over AVS discharge instructions with patient. Patient verbalized understanding.No further questions from patient.

## 2021-03-04 NOTE — Progress Notes (Signed)
2 Days Post-Op Subjective: Patient continues to have some left-sided pain likely related to stent discomfort.  She is afebrile.  Urine culture E. coli is pansensitive.  Feels she could be discharged home on oral quinolone Cipro 500 mg twice daily for 10 days.  Will need to schedule definitive ureteroscopy and laser lithotripsy in about 2 weeks.    Objective: Vital signs in last 24 hours: Temp:  [97.9 F (36.6 C)-99.4 F (37.4 C)] 98.1 F (36.7 C) (09/12 0506) Pulse Rate:  [43-62] 57 (09/12 0617) Resp:  [16-20] 18 (09/12 0617) BP: (83-118)/(55-78) 98/66 (09/12 0617) SpO2:  [100 %] 100 % (09/12 0617)  Intake/Output from previous day: 09/11 0701 - 09/12 0700 In: 187.8 [P.O.:100; IV Piggyback:87.8] Out: -  Intake/Output this shift: No intake/output data recorded.  Physical Exam:  General: Alert and oriented  Lab Results: Recent Labs    03/02/21 0411 03/03/21 0431 03/04/21 0338  HGB 9.3* 11.4* 9.1*  HCT 28.4* 34.3* 27.6*   BMET Recent Labs    03/02/21 0411 03/03/21 0431  NA 140 139  K 4.3 4.1  CL 107 105  CO2 25 25  GLUCOSE 119* 140*  BUN 17 19  CREATININE 0.89 0.50  CALCIUM 8.8* 9.1     Studies/Results: DG C-Arm 1-60 Min-No Report  Result Date: 03/02/2021 Fluoroscopy was utilized by the requesting physician.  No radiographic interpretation.    Assessment/Plan: Right ureteral calculus status post cystoscopy stent, improving. Plan/recommendation: Could be discharged home on oral tramadol and tamsulosin 0.4 mg daily for stent discomfort.  Also needs to be discharged on Cipro 500 mg twice daily for 10 days.  Patient desires social work consult so she can apply for Medicaid.  We will try to facilitate prior to discharge.    LOS: 2 days   Belva Agee 03/04/2021, 8:54 AM

## 2021-03-04 NOTE — Discharge Summary (Signed)
Physician Discharge Summary  Colleen Oneill ZOX:[id number] DOB: 1991-03-08 DOA: 03/01/2021  PCP: Patient, No Pcp Per (Inactive)  Admit date: 03/01/2021 Discharge date: 03/04/2021  Admitted From: Home Disposition:   Home   Recommendations for Outpatient Follow-up and new medication changes:  Follow up with Primary care in 7 to 10 days.  Follow up with Urology in 10 to 14 days for ureteroscopy and laser lithotripsy.  Continue with antibiotic therapy ciprofloxacin 500 mg bid for 10 days. Flomax 0.4 mg for nephrolithiasis Pain control with acetaminophen and tramadol as needed   Home Health: no   Equipment/Devices: no    Discharge Condition: stable  CODE STATUS: full  Diet recommendation:  regular   Brief/Interim Summary: Colleen Oneill was admitted to the hospital with the working diagnosis of right pyelonephritis, complicated with right obstructing stone and hydronephrosis.    30 yo female with recent COVID 19 infection in 08/22, who presented with 2 days of bilateral flank pain.  It was associated with nausea, vomiting and dysuria.  On her initial physical examination her temperature was 37.8 C, blood pressure 119/69, heart rate 103, respiratory rate 20, oxygenation 98% on room air.  Her lungs were clear to auscultation bilaterally, heart S1-S2, present, rhythmic, abdomen soft, right flank tenderness, no lower extremity edema.   Sodium 133, potassium 3.4, chloride 101, bicarb 21, glucose 148, BUN 15, creatinine 0.79, white count 17.8, hemoglobin 10.8, hematocrit 31.8, platelets 256.   Urinalysis specific gravity 1.015, 100 protein, > 50 red cells, > 50 white cells.   CT of the abdomen with polycystic kidney disease, 4-5 mm obstructing stone in the right UVJ with mild upstream hydro ureteronephrosis.  Inflammatory stranding around the right lower pole of the kidney.   Patient was placed on IV antibiotic and IV fluids, as needed analgesics and antiemetics.   Urology was consulted and patient  underwent cystoscopy with right retrograde pyelogram with insertion of right JJ stent.   Patient will need lase lithotripsy on 10 to 14 days.   Continue oral antibiotic therapy for 10 more days.   Acute right pyelonephritis, obstructing nephrolithiasis, stone at the right UVJ with hydroureteronephrosis.  Patient was admitted to the medical ward with a remote telemetry monitor. She received broad-spectrum antibiotic therapy with IV ceftriaxone and intravenous fluids with isotonic solutions. Intravenous analgesics and antiemetics.  Patient underwent cystoscopy and right JJ stent placement with good toleration. Her symptoms clinically improved, she has remained afebrile, her discharge white cell count is 13.3.  Her urine culture was positive for E. coli which was pansensitive.  Antibiotic therapy was narrowed to IV cefazolin and she will be discharged on ciprofloxacin.  She needs definitive management of her ureteral calculus with ureteroscopy and laser lithotripsy in 10 to 14 days after discharge Continue antibiotic therapy with ciprofloxacin for 10 more days, tamsulosin for nephrolithiasis and pain control with tramadol.  2.  Polycystic kidney disease.  Her kidney function remained stable, she tolerated well intravenous fluids. Patient will need to establish primary care for close follow-up on her kidney disease.  3.  Chronic anemia.  Her hemoglobin and hematocrit remained stable, no indication for PRBC transfusion. Her discharge hemoglobin is 9.1, hematocrit 27.6, plan to follow-up as an outpatient.   Discharge Diagnoses:  Principal Problem:   Acute pyelonephritis Active Problems:   Sepsis (HCC)   Polycystic kidney disease   Anemia, chronic disease   Pyelonephritis    Discharge Instructions   Allergies as of 03/04/2021   No Known Allergies  Medication List     STOP taking these medications    ibuprofen 200 MG tablet Commonly known as: ADVIL   ondansetron 4 MG  disintegrating tablet Commonly known as: Zofran ODT       TAKE these medications    acetaminophen 325 MG tablet Commonly known as: TYLENOL Take 2 tablets (650 mg total) by mouth every 6 (six) hours as needed for mild pain or moderate pain. What changed:  medication strength how much to take reasons to take this   ciprofloxacin 500 MG tablet Commonly known as: CIPRO Take 1 tablet (500 mg total) by mouth 2 (two) times daily for 10 days.   tamsulosin 0.4 MG Caps capsule Commonly known as: FLOMAX Take 1 capsule (0.4 mg total) by mouth daily for 15 days.   traMADol 50 MG tablet Commonly known as: ULTRAM Take 1 tablet (50 mg total) by mouth every 6 (six) hours as needed for severe pain.        Follow-up Information     Maxie Barb, MD. Schedule an appointment as soon as possible for a visit in 1 month(s).   Specialties: Nephrology, Internal Medicine Contact information: [address] [place] [address] [phone]         Belva Agee, MD Follow up.   Specialty: Urology Why: office will call with appointment Contact information: [address] [place] [address] [phone]         [facility] Follow up.   Specialty: Internal Medicine Why: Appointment Friday 9/16 at 11 AM. Please keep this appointment. Contact information: [address] [place] [address] [phone]        [facility] Follow up.   Why: You may purchase your medications here. Contact information: [address] [place] [address] [phone]               No Known Allergies  Consultations: Urology    Procedures/Studies: DG Chest 2 View  Result Date: 02/13/2021 CLINICAL DATA:  Worsening shortness of breath, fever, chills, COVID-19 positive 02/11/2021 EXAM: CHEST - 2 VIEW COMPARISON:  02/11/2021 FINDINGS: The heart size and mediastinal contours are within  normal limits. Both lungs are clear. The visualized skeletal structures are unremarkable. IMPRESSION: No active cardiopulmonary disease. Electronically Signed   By: Sharlet Salina M.D.   On: 02/13/2021 21:34   DG Chest 2 View  Result Date: 02/11/2021 CLINICAL DATA:  Fever and chills. EXAM: CHEST - 2 VIEW COMPARISON:  Chest radiograph dated 06/29/2018. FINDINGS: The heart size and mediastinal contours are within normal limits. Both lungs are clear. The visualized skeletal structures are unremarkable. IMPRESSION: No active cardiopulmonary disease. Electronically Signed   By: Elgie Collard M.D.   On: 02/11/2021 20:44   CT Abdomen Pelvis W Contrast  Result Date: 03/01/2021 CLINICAL DATA:  Abdominal pain for 2 days EXAM: CT ABDOMEN AND PELVIS WITH CONTRAST TECHNIQUE: Multidetector CT imaging of the abdomen and pelvis was performed using the standard protocol following bolus administration of intravenous contrast. CONTRAST:  44mL OMNIPAQUE IOHEXOL 350 MG/ML SOLN COMPARISON:  None. FINDINGS: Lower chest: The lung bases are clear. Hepatobiliary: There are numerous cysts throughout the liver. There are no enhancing lesions. The gallbladder is unremarkable. There is no biliary ductal dilatation. Pancreas: Unremarkable. Spleen: Unremarkable. Adrenals/Urinary Tract: The adrenals are unremarkable. The kidneys are enlarged measuring up to 15.2 cm in length on the left and 17.0 cm on the right.  There are innumerable cysts throughout both kidneys consistent with polycystic kidney disease. There are small nonobstructing left renal stones. There is no left hydronephrosis. There is a 5 mm pocket obstructing stone at the right UVJ with mild upstream hydroureteronephrosis. There is mild stranding in the perinephric fat adjacent to the right lower pole. There is possible relative hypoenhancement of the thinned renal parenchyma in the right lower pole (7-41). The bladder is decompressed but grossly unremarkable. Stomach/Bowel:  The stomach is unremarkable. There is no evidence of bowel obstruction. There is no evidence of bowel obstruction. There is no abnormal bowel wall thickening or inflammatory change. The tentatively identified appendix is normal (3-63). Vascular/Lymphatic: The abdominal aorta is normal in course and caliber. The main portal and splenic veins are patent. There is no abdominal or pelvic lymphadenopathy. Reproductive: The uterus and adnexa are unremarkable. Other: There is trace free fluid in the pelvis which may be reactive or physiologic. Musculoskeletal: There is no acute osseous abnormality. IMPRESSION: 1. Findings above consistent with polycystic kidney disease. 2. 4-5 mm obstructing stone at the right UVJ with mild upstream hydroureteronephrosis. Inflammatory stranding around the right lower pole which may be relatively hypoenhancing raises suspicion for pyelonephritis. No definite evidence of superinfection of a renal cyst. 3. Small nonobstructing left renal stones. Electronically Signed   By: Lesia HausenPeter  Noone M.D.   On: 03/01/2021 14:23   DG C-Arm 1-60 Min-No Report  Result Date: 03/02/2021 Fluoroscopy was utilized by the requesting physician.  No radiographic interpretation.     Procedures:   Subjective: Patient is feeling better, her pain is better controlled, no nausea or vomiting, no dyspnea.   Discharge Exam: Vitals:   03/04/21 0506 03/04/21 0617  BP: (!) 83/55 98/66  Pulse: (!) 43 (!) 57  Resp: 20 18  Temp: 98.1 F (36.7 C)   SpO2: 100% 100%   Vitals:   03/03/21 1217 03/03/21 2020 03/04/21 0506 03/04/21 0617  BP: 113/78 118/75 (!) 83/55 98/66  Pulse: 62 (!) 57 (!) 43 (!) 57  Resp: 18 16 20 18   Temp: 97.9 F (36.6 C) 99.4 F (37.4 C) 98.1 F (36.7 C)   TempSrc: Oral Oral Oral   SpO2: 100% 100% 100% 100%  Weight:      Height:        General: Not in pain or dyspnea  Neurology: Awake and alert, non focal  E ENT: no pallor, no icterus, oral mucosa moist Cardiovascular: No  JVD. S1-S2 present, rhythmic, no gallops, rubs, or murmurs. No lower extremity edema. Pulmonary: positive breath sounds bilaterally, adequate air movement, no wheezing, rhonchi or rales. Gastrointestinal. Abdomen soft.  Skin. No rashes Musculoskeletal: no joint deformities   The results of significant diagnostics from this hospitalization (including imaging, microbiology, ancillary and laboratory) are listed below for reference.     Microbiology: Recent Results (from the past 240 hour(s))  Wet prep, genital     Status: Abnormal   Collection Time: 03/01/21  1:31 PM  Result Value Ref Range Status   Yeast Wet Prep HPF POC NONE SEEN NONE SEEN Final    Comment: Specimen diluted due to transport tube containing more than 1 ml of saline, interpret results with caution.   Trich, Wet Prep NONE SEEN NONE SEEN Final   Clue Cells Wet Prep HPF POC NONE SEEN NONE SEEN Final   WBC, Wet Prep HPF POC FEW (A) NONE SEEN Final   Sperm NONE SEEN  Final    Comment: Performed at [facility], [address] [place], [place] [address]  Urine Culture     Status: Abnormal   Collection Time: 03/01/21  3:13 PM   Specimen: Urine, Clean Catch  Result Value Ref Range Status   Specimen Description URINE, CLEAN CATCH  Final   Special Requests   Final    NONE Performed at [facility], [address] [place], [place] [address]    Culture >=100,000 COLONIES/mL ESCHERICHIA COLI (A)  Final   Report Status 03/03/2021 FINAL  Final   Organism ID, Bacteria ESCHERICHIA COLI (A)  Final      Susceptibility   Escherichia coli - MIC*    AMPICILLIN <=2 SENSITIVE Sensitive     CEFAZOLIN <=4 SENSITIVE Sensitive     CEFEPIME <=0.12 SENSITIVE Sensitive     CEFTRIAXONE <=0.25 SENSITIVE Sensitive     CIPROFLOXACIN <=0.25 SENSITIVE Sensitive     GENTAMICIN <=1 SENSITIVE Sensitive     IMIPENEM <=0.25 SENSITIVE Sensitive     NITROFURANTOIN <=16 SENSITIVE Sensitive     TRIMETH/SULFA <=20 SENSITIVE Sensitive      AMPICILLIN/SULBACTAM <=2 SENSITIVE Sensitive     PIP/TAZO <=4 SENSITIVE Sensitive     * >=100,000 COLONIES/mL ESCHERICHIA COLI  Blood culture (routine x 2)     Status: None (Preliminary result)   Collection Time: 03/01/21  8:46 PM   Specimen: BLOOD  Result Value Ref Range Status   Specimen Description   Final    BLOOD LEFT ANTECUBITAL Performed at [facility], [address], [place], [place] [address]    Special Requests   Final    BOTTLES DRAWN AEROBIC AND ANAEROBIC Blood Culture adequate volume Performed at [facility], [address], [place], [place] [address]    Culture   Final    NO GROWTH 2 DAYS Performed at [facility], [address], [place], [place] [address]    Report Status PENDING  Incomplete  Blood culture (routine x 2)     Status: None (Preliminary result)   Collection Time: 03/01/21  8:46 PM   Specimen: BLOOD  Result Value Ref Range Status   Specimen Description   Final    BLOOD RIGHT WRIST Performed at [facility], [address], [place], [place] [address]    Special Requests   Final    BOTTLES DRAWN AEROBIC AND ANAEROBIC Blood Culture adequate volume Performed at [facility], [address] [place], [place] [address]    Culture   Final    NO GROWTH 2 DAYS Performed at [facility], [address] [place], [place] [address]    Report Status PENDING  Incomplete     Labs: BNP (last 3 results) No results for input(s): BNP in the last 8760 hours. Basic Metabolic Panel: Recent Labs  Lab 03/01/21 1005 03/02/21 0411 03/03/21 0431  NA 133* 140 139  K 3.4* 4.3 4.1  CL 101 107 105  CO2 21* 25 25  GLUCOSE 148* 119* 140*  BUN 15 17 19   CREATININE 0.79 0.89 0.50  CALCIUM 9.1 8.8* 9.1   Liver Function Tests: Recent Labs  Lab 03/01/21 1005 03/02/21 0411  AST 14* 15  ALT 8 8  ALKPHOS 40 39  BILITOT 0.7 0.8  PROT 6.8 6.3*  ALBUMIN 3.4* 3.0*   Recent  Labs  Lab 03/01/21 1005  LIPASE 20   No results for input(s): AMMONIA in the last 168 hours. CBC: Recent Labs  Lab 03/01/21 1005 03/02/21 0411  03/03/21 0431 03/04/21 0338  WBC 17.8* 15.4* 10.5 13.3*  NEUTROABS  --   --  9.6*  --   HGB 10.8* 9.3* 11.4* 9.1*  HCT 31.8* 28.4* 34.3* 27.6*  MCV 94.6 95.9 95.3 94.2  PLT 256 206 188 243   Cardiac Enzymes: No results for input(s): CKTOTAL, CKMB, CKMBINDEX, TROPONINI in the last 168 hours. BNP: Invalid input(s): POCBNP CBG: No results for input(s): GLUCAP in the last 168 hours. D-Dimer No results for input(s): DDIMER in the last 72 hours. Hgb A1c Recent Labs    03/02/21 0411  HGBA1C 5.3   Lipid Profile No results for input(s): CHOL, HDL, LDLCALC, TRIG, CHOLHDL, LDLDIRECT in the last 72 hours. Thyroid function studies No results for input(s): TSH, T4TOTAL, T3FREE, THYROIDAB in the last 72 hours.  Invalid input(s): FREET3 Anemia work up No results for input(s): VITAMINB12, FOLATE, FERRITIN, TIBC, IRON, RETICCTPCT in the last 72 hours. Urinalysis    Component Value Date/Time   COLORURINE YELLOW 03/01/2021 1030   APPEARANCEUR CLOUDY (A) 03/01/2021 1030   LABSPEC 1.015 03/01/2021 1030   PHURINE 6.5 03/01/2021 1030   GLUCOSEU NEGATIVE 03/01/2021 1030   HGBUR LARGE (A) 03/01/2021 1030   BILIRUBINUR NEGATIVE 03/01/2021 1030   BILIRUBINUR NEGATIVE 08/03/2013 1515   KETONESUR NEGATIVE 03/01/2021 1030   PROTEINUR 100 (A) 03/01/2021 1030   UROBILINOGEN 2.0 (H) 08/22/2013 2004   NITRITE POSITIVE (A) 03/01/2021 1030   LEUKOCYTESUR MODERATE (A) 03/01/2021 1030   Sepsis Labs Invalid input(s): PROCALCITONIN,  WBC,  LACTICIDVEN Microbiology Recent Results (from the past 240 hour(s))  Wet prep, genital     Status: Abnormal   Collection Time: 03/01/21  1:31 PM  Result Value Ref Range Status   Yeast Wet Prep HPF POC NONE SEEN NONE SEEN Final    Comment: Specimen diluted due to transport tube containing more than 1 ml of saline,  interpret results with caution.   Trich, Wet Prep NONE SEEN NONE SEEN Final   Clue Cells Wet Prep HPF POC NONE SEEN NONE SEEN Final   WBC, Wet Prep HPF POC FEW (A) NONE SEEN Final   Sperm NONE SEEN  Final    Comment: Performed at [facility], [address], [place], [place] [address]  Urine Culture     Status: Abnormal   Collection Time: 03/01/21  3:13 PM   Specimen: Urine, Clean Catch  Result Value Ref Range Status   Specimen Description URINE, CLEAN CATCH  Final   Special Requests   Final    NONE Performed at [facility], [address] [place], [place] [address]    Culture >=100,000 COLONIES/mL ESCHERICHIA COLI (A)  Final   Report Status 03/03/2021 FINAL  Final   Organism ID, Bacteria ESCHERICHIA COLI (A)  Final      Susceptibility   Escherichia coli - MIC*    AMPICILLIN <=2 SENSITIVE Sensitive     CEFAZOLIN <=4 SENSITIVE Sensitive     CEFEPIME <=0.12 SENSITIVE Sensitive     CEFTRIAXONE <=0.25 SENSITIVE Sensitive     CIPROFLOXACIN <=0.25 SENSITIVE Sensitive     GENTAMICIN <=1 SENSITIVE Sensitive     IMIPENEM <=0.25 SENSITIVE Sensitive     NITROFURANTOIN <=16 SENSITIVE Sensitive     TRIMETH/SULFA <=20 SENSITIVE Sensitive     AMPICILLIN/SULBACTAM <=2 SENSITIVE Sensitive     PIP/TAZO <=4 SENSITIVE Sensitive     * >=100,000 COLONIES/mL ESCHERICHIA COLI  Blood culture (routine x 2)     Status: None (Preliminary result)  Collection Time: 03/01/21  8:46 PM   Specimen: BLOOD  Result Value Ref Range Status   Specimen Description   Final    BLOOD LEFT ANTECUBITAL Performed at [facility], [address], [place], [place] [address]    Special Requests   Final    BOTTLES DRAWN AEROBIC AND ANAEROBIC Blood Culture adequate volume Performed at [facility], [address], [place], [place] [address]    Culture   Final    NO GROWTH 2 DAYS Performed at [facility], [address], [place], [place] [address]    Report  Status PENDING  Incomplete  Blood culture (routine x 2)     Status: None (Preliminary result)   Collection Time: 03/01/21  8:46 PM   Specimen: BLOOD  Result Value Ref Range Status   Specimen Description   Final    BLOOD RIGHT WRIST Performed at [facility], [address] [place], [place] [address]    Special Requests   Final    BOTTLES DRAWN AEROBIC AND ANAEROBIC Blood Culture adequate volume Performed at [facility], [address] [place], [place] [address]    Culture   Final    NO GROWTH 2 DAYS Performed at [facility], [address] [place], [place] [address]    Report Status PENDING  Incomplete     Time coordinating discharge: 45 minutes  SIGNED:   Coralie Keens, MD  [facility] 03/04/2021, 9:04 AM

## 2021-03-04 NOTE — TOC Progression Note (Signed)
Transition of Care [facility]) - Progression Note    Patient Details  Name: Colleen Oneill MRN: [id number] Date of Birth: Dec 21, 1990  Transition of Care [facility]) CM/SW Contact  Geni Bers, RN Phone Number: 03/04/2021, 10:21 AM  Clinical Narrative:    Spoke with pt concerning follow up appointment and Medications at [facility].    Expected Discharge Plan: Home/Self Care Barriers to Discharge: No Barriers Identified  Expected Discharge Plan and Services Expected Discharge Plan: Home/Self Care       Living arrangements for the past 2 months: Single Family Home Expected Discharge Date: 03/04/21                                     Social Determinants of Health (SDOH) Interventions    Readmission Risk Interventions No flowsheet data found.

## 2021-03-06 LAB — CULTURE, BLOOD (ROUTINE X 2)
Culture: NO GROWTH
Culture: NO GROWTH
Special Requests: ADEQUATE
Special Requests: ADEQUATE

## 2021-03-07 MED FILL — Hydromorphone HCl Inj 1 MG/ML: INTRAMUSCULAR | Qty: 1 | Status: AC

## 2021-03-08 ENCOUNTER — Encounter: Payer: Self-pay | Admitting: Nurse Practitioner

## 2021-03-08 ENCOUNTER — Other Ambulatory Visit: Payer: Self-pay

## 2021-03-08 ENCOUNTER — Ambulatory Visit (INDEPENDENT_AMBULATORY_CARE_PROVIDER_SITE_OTHER): Payer: Self-pay | Admitting: Nurse Practitioner

## 2021-03-08 VITALS — BP 114/61 | HR 87 | Temp 98.6°F | Ht 68.0 in | Wt 121.0 lb

## 2021-03-08 DIAGNOSIS — R109 Unspecified abdominal pain: Secondary | ICD-10-CM

## 2021-03-08 DIAGNOSIS — N10 Acute tubulo-interstitial nephritis: Secondary | ICD-10-CM

## 2021-03-08 DIAGNOSIS — Z0000 Encounter for general adult medical examination without abnormal findings: Secondary | ICD-10-CM

## 2021-03-08 DIAGNOSIS — R110 Nausea: Secondary | ICD-10-CM

## 2021-03-08 DIAGNOSIS — R42 Dizziness and giddiness: Secondary | ICD-10-CM

## 2021-03-08 DIAGNOSIS — R10A1 Flank pain, right side: Secondary | ICD-10-CM

## 2021-03-08 DIAGNOSIS — R8290 Unspecified abnormal findings in urine: Secondary | ICD-10-CM

## 2021-03-08 DIAGNOSIS — N200 Calculus of kidney: Secondary | ICD-10-CM

## 2021-03-08 LAB — POCT URINALYSIS DIP (CLINITEK)
Bilirubin, UA: NEGATIVE
Glucose, UA: NEGATIVE mg/dL
Ketones, POC UA: NEGATIVE mg/dL
Nitrite, UA: NEGATIVE
POC PROTEIN,UA: 30 — AB
Spec Grav, UA: 1.02 (ref 1.010–1.025)
Urobilinogen, UA: 2 E.U./dL — AB
pH, UA: 7 (ref 5.0–8.0)

## 2021-03-08 MED ORDER — MECLIZINE HCL 25 MG PO TABS
50.0000 mg | ORAL_TABLET | Freq: Three times a day (TID) | ORAL | 0 refills | Status: DC | PRN
  Filled 2021-03-08: qty 30, 10d supply, fill #0
  Filled 2021-03-08: qty 60, 10d supply, fill #0

## 2021-03-08 NOTE — Progress Notes (Signed)
[facility] Patient [name], [name] Phone:  [phone]   Fax:  [phone] Subjective:   Patient ID: Colleen Oneill, female    DOB: 12-Jan-1991, 30 y.o.   MRN: [id number]  Chief Complaint  Patient presents with   [facility] admission 03/01/2021 - 03/04/2021 ; Acute pyelonephritis. Flomax and Cipro causing dizziness unable to focus or do any thing. Last dose last night.    HPI Colleen Oneill 30 y.o. female with history of pyelonephritis and renal calculi to the [facility] for follow up after recent hospital admission. Patient states that since being discharged from hospital symptoms have improved. She continues to have decreased appetite with nausea and intermittent pain on the right side. Has some urinary frequency and urgency but denies any pain with urination. Endorses dizziness, when going from sitting to standing, but was informed that this was likely related to discharge medications. States that she is taking cipro, flomax and tramadol. When dizziness occurs, it feels like her head is spinning. States that she is compliant with all medications and drinks optimal amount of fluids during the day. Denies any vomiting or diarrhea. Denies any chest pain, HA or shortness of breath. Denies any recent trauma or injury.   Currently rates pain on right side 6-7/10 and describes as throbbing, aching and cramping. Pain worsens with movement and improves with medication, Tramadol.   Decreased appetite with nausea, intermittent pain in right flank pain, improved since completing treatment. Frequency and urgency, no burning with urination. Has some dizziness, states that she was informed it was related to medications. Symptoms mimic those previously treated, persisted but not as intense. No fever, chest pain or shortness of breath.    Past Medical History:  Diagnosis Date   Medical history non-contributory     Past Surgical History:  Procedure  Laterality Date   CESAREAN SECTION     CESAREAN SECTION N/A 08/09/2012   Procedure: Repeat CESAREAN SECTION of baby boy  at 0431  APGAR 9/9;  Surgeon: Kathreen Cosier, MD;  Location: [facility];  Service: Obstetrics;  Laterality: N/A;   CESAREAN SECTION N/A 08/23/2013   Procedure: CESAREAN SECTION;  Surgeon: Brock Bad, MD;  Location: [facility];  Service: Obstetrics;  Laterality: N/A;   CYSTOSCOPY/URETEROSCOPY/HOLMIUM LASER/STENT PLACEMENT Right 03/02/2021   Procedure: CYSTOSCOPY; RIGHT RETROGRADE PYELOGRAM; RIGTH URETERAL STENT PLACEMENT;  Surgeon: Belva Agee, MD;  Location: [facility];  Service: Urology;  Laterality: Right;    Family History  Problem Relation Age of Onset   Kidney disease Father    Diabetes Maternal Grandmother    Other Neg Hx     Social History   Socioeconomic History   Marital status: Single    Spouse name: Not on file   Number of children: Not on file   Years of education: Not on file   Highest education level: Not on file  Occupational History   Not on file  Tobacco Use   Smoking status: Never   Smokeless tobacco: Never  Substance and Sexual Activity   Alcohol use: No   Drug use: No   Sexual activity: Not Currently    Birth control/protection: None  Other Topics Concern   Not on file  Social History Narrative   Not on file   Social Determinants of Health   Financial Resource Strain: Not on file  Food Insecurity: Not on file  Transportation Needs: Not on file  Physical Activity:  Not on file  Stress: Not on file  Social Connections: Not on file  Intimate Partner Violence: Not on file    Outpatient Medications Prior to Visit  Medication Sig Dispense Refill   acetaminophen (TYLENOL) 325 MG tablet Take 2 tablets (650 mg total) by mouth every 6 (six) hours as needed for mild pain or moderate pain.     ciprofloxacin (CIPRO) 500 MG tablet Take 1 tablet (500 mg total) by mouth 2 (two) times daily for 10 days. 20 tablet 0   tamsulosin (FLOMAX) 0.4  MG CAPS capsule Take 1 capsule (0.4 mg total) by mouth daily for 15 days. 15 capsule 0   traMADol (ULTRAM) 50 MG tablet Take 1 tablet (50 mg total) by mouth every 6 (six) hours as needed for severe pain. 10 tablet 0   No facility-administered medications prior to visit.    No Known Allergies  Review of Systems  Constitutional:  Negative for chills, fever and malaise/fatigue.       Decreased appetite  HENT: Negative.    Eyes: Negative.   Respiratory:  Negative for cough and shortness of breath.   Cardiovascular:  Negative for chest pain, palpitations and leg swelling.  Gastrointestinal:  Positive for nausea. Negative for abdominal pain, blood in stool, constipation, diarrhea and vomiting.  Genitourinary:  Positive for flank pain, frequency and urgency. Negative for dysuria and hematuria.  Skin: Negative.   Neurological:  Positive for dizziness. Negative for tingling, tremors, weakness and headaches.  Psychiatric/Behavioral:  Negative for depression. The patient is not nervous/anxious.   All other systems reviewed and are negative.     Objective:    Physical Exam Vitals reviewed.  Constitutional:      General: She is not in acute distress.    Appearance: Normal appearance.  HENT:     Head: Normocephalic.     Right Ear: Tympanic membrane, ear canal and external ear normal.     Left Ear: Tympanic membrane, ear canal and external ear normal.     Nose: Nose normal.     Mouth/Throat:     Mouth: Mucous membranes are moist.     Pharynx: Oropharynx is clear.  Eyes:     Extraocular Movements: Extraocular movements intact.     Conjunctiva/sclera: Conjunctivae normal.     Pupils: Pupils are equal, round, and reactive to light.  Cardiovascular:     Rate and Rhythm: Normal rate and regular rhythm.     Pulses: Normal pulses.     Heart sounds: Normal heart sounds.     Comments: No obvious peripheral edema Pulmonary:     Effort: Pulmonary effort is normal.     Breath sounds: Normal  breath sounds.  Abdominal:     General: Abdomen is flat. Bowel sounds are normal. There is no distension.     Palpations: Abdomen is soft. There is no mass.     Tenderness: There is abdominal tenderness. There is left CVA tenderness. There is no guarding or rebound.     Comments: Mild tenderness with palpation of the RUQ and RLQ  Musculoskeletal:     Cervical back: Normal range of motion.  Skin:    General: Skin is warm and dry.     Capillary Refill: Capillary refill takes less than 2 seconds.  Neurological:     General: No focal deficit present.     Mental Status: She is alert and oriented to person, place, and time.  Psychiatric:        Mood and Affect:  Mood normal.        Behavior: Behavior normal.        Thought Content: Thought content normal.        Judgment: Judgment normal.    BP 114/61   Pulse 87   Temp 98.6 F (37 C)   Ht 5\' 8"  (1.727 m)   Wt 121 lb 0.2 oz (54.9 kg)   LMP 02/22/2021 (Approximate)   SpO2 98%   Breastfeeding No   BMI 18.40 kg/m  Wt Readings from Last 3 Encounters:  03/08/21 121 lb 0.2 oz (54.9 kg)  03/01/21 125 lb (56.7 kg)  02/13/21 125 lb 10.6 oz (57 kg)    Immunization History  Administered Date(s) Administered   Influenza,inj,Quad PF,6+ Mos 08/25/2013   Tdap 08/11/2012    Diabetic Foot Exam - Simple   No data filed     No results found for: TSH Lab Results  Component Value Date   WBC 13.3 (H) 03/04/2021   HGB 9.1 (L) 03/04/2021   HCT 27.6 (L) 03/04/2021   MCV 94.2 03/04/2021   PLT 243 03/04/2021   Lab Results  Component Value Date   NA 139 03/03/2021   K 4.1 03/03/2021   CO2 25 03/03/2021   GLUCOSE 140 (H) 03/03/2021   BUN 19 03/03/2021   CREATININE 0.50 03/03/2021   BILITOT 0.8 03/02/2021   ALKPHOS 39 03/02/2021   AST 15 03/02/2021   ALT 8 03/02/2021   PROT 6.3 (L) 03/02/2021   ALBUMIN 3.0 (L) 03/02/2021   CALCIUM 9.1 03/03/2021   ANIONGAP 9 03/03/2021   No results found for: CHOL No results found for: HDL No  results found for: LDLCALC No results found for: TRIG No results found for: CHOLHDL Lab Results  Component Value Date   HGBA1C 5.3 03/02/2021       Assessment & Plan:   Problem List Items Addressed This Visit       Genitourinary   Acute pyelonephritis   Relevant Orders   Ambulatory referral to Urology  Per chart review, patient completed procedure for removal of renal calculi on 9/10. There were no identified complications. No scheduled follow up.  Referral sent for follow up.    Other Visit Diagnoses     Healthcare maintenance    -  Primary   Relevant Orders   CBC with Differential/Platelet   Comprehensive metabolic panel   Lipid panel   POCT URINALYSIS DIP (CLINITEK) (Completed)   Dizziness       Relevant Medications   meclizine (ANTIVERT) 25 MG tablet   Other Relevant Orders   CBC with Differential/Platelet   Comprehensive metabolic panel   POCT URINALYSIS DIP (CLINITEK) (Completed)   Orthostatic vital signs: negative   Nausea       Relevant Medications   meclizine (ANTIVERT) 25 MG tablet   Right flank pain       Relevant Orders   Ambulatory referral to Urology Likely residual from recent pyelonephritis/ renal calculi   Renal calculi       Relevant Orders   Ambulatory referral to Urology Likely residual from recent pyelonephritis/ renal calculi   Abnormal urinalysis       Relevant Orders   Urine Culture Currently taking Cipro, 10 day treatment    Follow up in 2 mths for Pap smear and reevaluation of symptoms    I am having Colleen Oneill start on meclizine. I am also having her maintain her tamsulosin, ciprofloxacin, traMADol, and acetaminophen.  Meds ordered this encounter  Medications   meclizine (ANTIVERT) 25 MG tablet    Sig: Take 2 tablets (50 mg total) by mouth 3 (three) times daily as needed for dizziness or nausea.    Dispense:  60 tablet    Refill:  0     Kathrynn Speed, NP

## 2021-03-08 NOTE — Patient Instructions (Signed)
You were seen today in the [facility] for follow up for recent hospital admission. Labs were collected, results will be available via MyChart or, if abnormal, you will be contacted by clinic staff. You were prescribed medications, please take as directed. Please follow up in 2 mths for reevaluation of symptoms evaluated today and Pap smear.

## 2021-03-09 LAB — CBC WITH DIFFERENTIAL/PLATELET
Basophils Absolute: 0 10*3/uL (ref 0.0–0.2)
Basos: 0 %
EOS (ABSOLUTE): 0.1 10*3/uL (ref 0.0–0.4)
Eos: 1 %
Hematocrit: 32.9 % — ABNORMAL LOW (ref 34.0–46.6)
Hemoglobin: 10.6 g/dL — ABNORMAL LOW (ref 11.1–15.9)
Immature Grans (Abs): 0.1 10*3/uL (ref 0.0–0.1)
Immature Granulocytes: 1 %
Lymphocytes Absolute: 1.6 10*3/uL (ref 0.7–3.1)
Lymphs: 15 %
MCH: 30.7 pg (ref 26.6–33.0)
MCHC: 32.2 g/dL (ref 31.5–35.7)
MCV: 95 fL (ref 79–97)
Monocytes Absolute: 0.5 10*3/uL (ref 0.1–0.9)
Monocytes: 5 %
Neutrophils Absolute: 8 10*3/uL — ABNORMAL HIGH (ref 1.4–7.0)
Neutrophils: 78 %
Platelets: 471 10*3/uL — ABNORMAL HIGH (ref 150–450)
RBC: 3.45 x10E6/uL — ABNORMAL LOW (ref 3.77–5.28)
RDW: 12.3 % (ref 11.7–15.4)
WBC: 10.2 10*3/uL (ref 3.4–10.8)

## 2021-03-09 LAB — LIPID PANEL
Chol/HDL Ratio: 5.7 ratio — ABNORMAL HIGH (ref 0.0–4.4)
Cholesterol, Total: 137 mg/dL (ref 100–199)
HDL: 24 mg/dL — ABNORMAL LOW (ref >39)
LDL Chol Calc (NIH): 96 mg/dL (ref 0–99)
Triglycerides: 86 mg/dL (ref 0–149)
VLDL Cholesterol Cal: 17 mg/dL (ref 5–40)

## 2021-03-09 LAB — COMPREHENSIVE METABOLIC PANEL
ALT: 8 IU/L (ref 0–32)
AST: 9 IU/L (ref 0–40)
Albumin/Globulin Ratio: 1.1 — ABNORMAL LOW (ref 1.2–2.2)
Albumin: 3.9 g/dL (ref 3.9–5.0)
Alkaline Phosphatase: 50 IU/L (ref 44–121)
BUN/Creatinine Ratio: 30 — ABNORMAL HIGH (ref 9–23)
BUN: 20 mg/dL (ref 6–20)
Bilirubin Total: 0.2 mg/dL (ref 0.0–1.2)
CO2: 26 mmol/L (ref 20–29)
Calcium: 9.6 mg/dL (ref 8.7–10.2)
Chloride: 101 mmol/L (ref 96–106)
Creatinine, Ser: 0.67 mg/dL (ref 0.57–1.00)
Globulin, Total: 3.5 g/dL (ref 1.5–4.5)
Glucose: 139 mg/dL — ABNORMAL HIGH (ref 65–99)
Potassium: 4.2 mmol/L (ref 3.5–5.2)
Sodium: 141 mmol/L (ref 134–144)
Total Protein: 7.4 g/dL (ref 6.0–8.5)
eGFR: 121 mL/min/{1.73_m2} (ref >59)

## 2021-03-13 LAB — URINE CULTURE

## 2021-03-15 ENCOUNTER — Other Ambulatory Visit: Payer: Self-pay

## 2021-04-27 ENCOUNTER — Emergency Department (HOSPITAL_COMMUNITY): Payer: Self-pay

## 2021-04-27 ENCOUNTER — Other Ambulatory Visit: Payer: Self-pay

## 2021-04-27 ENCOUNTER — Inpatient Hospital Stay (HOSPITAL_COMMUNITY)
Admission: EM | Admit: 2021-04-27 | Discharge: 2021-05-02 | DRG: 690 | Disposition: A | Discharge status: Home / Self Care | Payer: Self-pay | Attending: Internal Medicine | Admitting: Internal Medicine

## 2021-04-27 ENCOUNTER — Encounter (HOSPITAL_COMMUNITY): Payer: Self-pay | Admitting: Emergency Medicine

## 2021-04-27 DIAGNOSIS — E876 Hypokalemia: Secondary | ICD-10-CM | POA: Diagnosis present

## 2021-04-27 DIAGNOSIS — Z597 Insufficient social insurance and welfare support: Secondary | ICD-10-CM

## 2021-04-27 DIAGNOSIS — Z20822 Contact with and (suspected) exposure to covid-19: Secondary | ICD-10-CM | POA: Diagnosis present

## 2021-04-27 DIAGNOSIS — N202 Calculus of kidney with calculus of ureter: Secondary | ICD-10-CM | POA: Diagnosis present

## 2021-04-27 DIAGNOSIS — N10 Acute tubulo-interstitial nephritis: Secondary | ICD-10-CM

## 2021-04-27 DIAGNOSIS — N136 Pyonephrosis: Principal | ICD-10-CM | POA: Diagnosis present

## 2021-04-27 DIAGNOSIS — Q613 Polycystic kidney, unspecified: Secondary | ICD-10-CM

## 2021-04-27 DIAGNOSIS — Z87442 Personal history of urinary calculi: Secondary | ICD-10-CM

## 2021-04-27 DIAGNOSIS — Z28310 Unvaccinated for covid-19: Secondary | ICD-10-CM

## 2021-04-27 DIAGNOSIS — Z23 Encounter for immunization: Secondary | ICD-10-CM

## 2021-04-27 DIAGNOSIS — N12 Tubulo-interstitial nephritis, not specified as acute or chronic: Secondary | ICD-10-CM

## 2021-04-27 DIAGNOSIS — Z841 Family history of disorders of kidney and ureter: Secondary | ICD-10-CM

## 2021-04-27 DIAGNOSIS — R112 Nausea with vomiting, unspecified: Secondary | ICD-10-CM

## 2021-04-27 HISTORY — DX: Polycystic kidney, unspecified: Q61.3

## 2021-04-27 LAB — PREGNANCY, URINE: Preg Test, Ur: NEGATIVE

## 2021-04-27 LAB — COMPREHENSIVE METABOLIC PANEL
ALT: 9 U/L (ref 0–44)
AST: 17 U/L (ref 15–41)
Albumin: 3.8 g/dL (ref 3.5–5.0)
Alkaline Phosphatase: 44 U/L (ref 38–126)
Anion gap: 11 (ref 5–15)
BUN: 15 mg/dL (ref 6–20)
CO2: 22 mmol/L (ref 22–32)
Calcium: 9.6 mg/dL (ref 8.9–10.3)
Chloride: 102 mmol/L (ref 98–111)
Creatinine, Ser: 0.64 mg/dL (ref 0.44–1.00)
GFR, Estimated: >60 mL/min (ref >60)
Glucose, Bld: 114 mg/dL — ABNORMAL HIGH (ref 70–99)
Potassium: 3.5 mmol/L (ref 3.5–5.1)
Sodium: 135 mmol/L (ref 135–145)
Total Bilirubin: 0.8 mg/dL (ref 0.3–1.2)
Total Protein: 7.5 g/dL (ref 6.5–8.1)

## 2021-04-27 LAB — CBC WITH DIFFERENTIAL/PLATELET
Abs Immature Granulocytes: 0.04 10*3/uL (ref 0.00–0.07)
Basophils Absolute: 0 10*3/uL (ref 0.0–0.1)
Basophils Relative: 0 %
Eosinophils Absolute: 0 10*3/uL (ref 0.0–0.5)
Eosinophils Relative: 0 %
HCT: 33.4 % — ABNORMAL LOW (ref 36.0–46.0)
Hemoglobin: 11.1 g/dL — ABNORMAL LOW (ref 12.0–15.0)
Immature Granulocytes: 0 %
Lymphocytes Relative: 13 %
Lymphs Abs: 1.8 10*3/uL (ref 0.7–4.0)
MCH: 30.7 pg (ref 26.0–34.0)
MCHC: 33.2 g/dL (ref 30.0–36.0)
MCV: 92.5 fL (ref 80.0–100.0)
Monocytes Absolute: 0.9 10*3/uL (ref 0.1–1.0)
Monocytes Relative: 6 %
Neutro Abs: 10.9 10*3/uL — ABNORMAL HIGH (ref 1.7–7.7)
Neutrophils Relative %: 81 %
Platelets: 326 10*3/uL (ref 150–400)
RBC: 3.61 MIL/uL — ABNORMAL LOW (ref 3.87–5.11)
RDW: 12.8 % (ref 11.5–15.5)
WBC: 13.6 10*3/uL — ABNORMAL HIGH (ref 4.0–10.5)
nRBC: 0 % (ref 0.0–0.2)

## 2021-04-27 LAB — URINALYSIS, ROUTINE W REFLEX MICROSCOPIC
Bilirubin Urine: NEGATIVE
Glucose, UA: NEGATIVE mg/dL
Ketones, ur: NEGATIVE mg/dL
Nitrite: NEGATIVE
Protein, ur: 100 mg/dL — AB
Specific Gravity, Urine: 1.014 (ref 1.005–1.030)
WBC, UA: >50 WBC/hpf — ABNORMAL HIGH (ref 0–5)
pH: 7 (ref 5.0–8.0)

## 2021-04-27 LAB — RESP PANEL BY RT-PCR (FLU A&B, COVID) ARPGX2
Influenza A by PCR: NEGATIVE
Influenza B by PCR: NEGATIVE
SARS Coronavirus 2 by RT PCR: NEGATIVE

## 2021-04-27 LAB — LIPASE, BLOOD: Lipase: 20 U/L (ref 11–51)

## 2021-04-27 MED ORDER — ACETAMINOPHEN 325 MG PO TABS
650.0000 mg | ORAL_TABLET | Freq: Once | ORAL | Status: AC
  Administered 2021-04-27: 650 mg via ORAL
  Filled 2021-04-27: qty 2

## 2021-04-27 MED ORDER — SODIUM CHLORIDE 0.9 % IV SOLN
1.0000 g | Freq: Once | INTRAVENOUS | Status: AC
  Administered 2021-04-27: 1 g via INTRAVENOUS
  Filled 2021-04-27: qty 10

## 2021-04-27 MED ORDER — ONDANSETRON HCL 4 MG PO TABS: 4.0000 mg | ORAL_TABLET | Freq: Four times a day (QID) | ORAL | Status: DC | PRN

## 2021-04-27 MED ORDER — TRAMADOL HCL 50 MG PO TABS: 50.0000 mg | ORAL_TABLET | Freq: Four times a day (QID) | ORAL | Status: DC | PRN

## 2021-04-27 MED ORDER — ACETAMINOPHEN 325 MG PO TABS
650.0000 mg | ORAL_TABLET | Freq: Four times a day (QID) | ORAL | Status: DC | PRN
  Administered 2021-04-30 (×2): 650 mg via ORAL
  Filled 2021-04-27 (×2): qty 2

## 2021-04-27 MED ORDER — SODIUM CHLORIDE 0.9 % IV SOLN
2.0000 g | INTRAVENOUS | Status: DC
  Administered 2021-04-28 – 2021-05-01 (×4): 2 g via INTRAVENOUS
  Filled 2021-04-27 (×5): qty 20

## 2021-04-27 MED ORDER — SODIUM CHLORIDE 0.9 % IV SOLN: 1.0000 g | INTRAVENOUS | Status: DC

## 2021-04-27 MED ORDER — ACETAMINOPHEN 650 MG RE SUPP: 650.0000 mg | Freq: Four times a day (QID) | RECTAL | Status: DC | PRN

## 2021-04-27 MED ORDER — SENNOSIDES-DOCUSATE SODIUM 8.6-50 MG PO TABS: 1.0000 | ORAL_TABLET | Freq: Every evening | ORAL | Status: DC | PRN

## 2021-04-27 MED ORDER — LACTATED RINGERS IV SOLN: INTRAVENOUS | Status: DC

## 2021-04-27 MED ORDER — LACTATED RINGERS IV BOLUS
1650.0000 mL | Freq: Once | INTRAVENOUS | Status: AC
  Administered 2021-04-27: 1650 mL via INTRAVENOUS

## 2021-04-27 MED ORDER — ONDANSETRON HCL 4 MG/2ML IJ SOLN: 4.0000 mg | Freq: Four times a day (QID) | INTRAMUSCULAR | Status: DC | PRN

## 2021-04-27 MED ORDER — HYDROMORPHONE HCL 1 MG/ML IJ SOLN
1.0000 mg | INTRAMUSCULAR | Status: DC | PRN
  Administered 2021-04-28: 1 mg via INTRAVENOUS
  Filled 2021-04-27: qty 1

## 2021-04-27 NOTE — ED Provider Notes (Signed)
Emergency Medicine Provider Triage Evaluation Note  Colleen Oneill , a 30 y.o. female  was evaluated in triage.  Pt complains of right lower quadrant pain, chills, nausea, vomiting, frequent urination.  Recent diagnosis of PKD 1 month ago.  Patient reports last menstrual period started today, not different than usual, not sexually active, no vaginal discharge or pelvic pain.  No diarrhea, no constipation.  No chest pain, no shortness of breath Review of Systems  Positive: As above Negative: As above  Physical Exam  BP 105/90 (BP Location: Right Arm)   Pulse 99   Temp (!) 100.4 F (38 C)   Resp 16   LMP 04/25/2021   SpO2 98%  Gen:   Awake, thin, tall female in no acute distress, somewhat pale Resp:  Normal effort  MSK:   Moves extremities without difficulty  Other:  Borderline tachycardia, tenderness in right lower quadrant, suprapubic region, no rebound, rigidity, guarding  Medical Decision Making  Medically screening exam initiated at 6:33 PM.  Appropriate orders placed.  RAELEE ROSSMANN was informed that the remainder of the evaluation will be completed by another provider, this initial triage assessment does not replace that evaluation, and the importance of remaining in the ED until their evaluation is complete.  RLQ, patient had recent dx PKD   Olene Floss, PA-C 04/27/21 1836    Rolan Bucco, MD 04/27/21 2011

## 2021-04-27 NOTE — ED Triage Notes (Addendum)
Reports diagnosed with polycystic kidney disease 1 month ago.  C/o RLQ pain, nausea, vomiting, burning with urination, fever, and chills since yesterday.

## 2021-04-27 NOTE — ED Provider Notes (Addendum)
[facility] EMERGENCY DEPARTMENT Provider Note   CSN: [id number] Arrival date & time: 04/27/21  1814     History No chief complaint on file.   Colleen Oneill is a 30 y.o. female.  HPI Patient is a 30 year old female who presents to the emergency department with bilateral flank pain as well as right-sided abdominal pain that started about 2 to 3 days ago.  Patient reports associated nausea, vomiting, and dysuria.  No chest pain, shortness of breath, URI symptoms.  Per records, patient had a recent admission on September 9 and was discharged on September 12.  Patient was diagnosed with acute right pyelonephritis, obstructing nephrolithiasis, stone at the right UVJ with hydroureteronephrosis.  She was treated with broad-spectrum antibiotics and IV fluids.  She underwent cystoscopy and a right JJ stent was placed.  Patient states that she cannot afford the co-pay to follow-up and has applied for Medicaid but this has not been approved yet.  She is unsure if the JJ stent is still in place.    Past Medical History:  Diagnosis Date   Medical history non-contributory    Polycystic kidney disease     Patient Active Problem List   Diagnosis Date Noted   Pyelonephritis 03/02/2021   Sepsis (HCC) 03/01/2021   Acute pyelonephritis 03/01/2021   Polycystic kidney disease 03/01/2021   Anemia, chronic disease 03/01/2021   Premature uterine contractions 08/23/2013   Fetal heart rate decelerations, delivered 08/23/2013   Nausea and vomiting in pregnancy prior to [redacted] weeks gestation 03/15/2013    Past Surgical History:  Procedure Laterality Date   CESAREAN SECTION     CESAREAN SECTION N/A 08/09/2012   Procedure: Repeat CESAREAN SECTION of baby boy  at 0431  APGAR 9/9;  Surgeon: Kathreen Cosier, MD;  Location: [facility];  Service: Obstetrics;  Laterality: N/A;   CESAREAN SECTION N/A 08/23/2013   Procedure: CESAREAN SECTION;  Surgeon: Brock Bad, MD;  Location: [facility];   Service: Obstetrics;  Laterality: N/A;   CYSTOSCOPY/URETEROSCOPY/HOLMIUM LASER/STENT PLACEMENT Right 03/02/2021   Procedure: CYSTOSCOPY; RIGHT RETROGRADE PYELOGRAM; RIGTH URETERAL STENT PLACEMENT;  Surgeon: Belva Agee, MD;  Location: [facility];  Service: Urology;  Laterality: Right;     OB History     Gravida  4   Para  4   Term  3   Preterm  1   AB      Living  4      SAB      IAB      Ectopic      Multiple      Live Births  4           Family History  Problem Relation Age of Onset   Kidney disease Father    Diabetes Maternal Grandmother    Other Neg Hx     Social History   Tobacco Use   Smoking status: Never   Smokeless tobacco: Never  Substance Use Topics   Alcohol use: No   Drug use: No    Home Medications Prior to Admission medications   Medication Sig Start Date End Date Taking? Authorizing Provider  acetaminophen (TYLENOL) 325 MG tablet Take 2 tablets (650 mg total) by mouth every 6 (six) hours as needed for mild pain or moderate pain. 03/04/21   Arrien, York Ram, MD  meclizine (ANTIVERT) 25 MG tablet Take 2 tablets (50 mg total) by mouth 3 (three) times daily as needed for dizziness or nausea. 03/08/21   Passmore,  Enid Derry I, NP  traMADol (ULTRAM) 50 MG tablet Take 1 tablet (50 mg total) by mouth every 6 (six) hours as needed for severe pain. 03/04/21   Arrien, York Ram, MD    Allergies    Patient has no known allergies.  Review of Systems   Review of Systems  All other systems reviewed and are negative. Ten systems reviewed and are negative for acute change, except as noted in the HPI.   Physical Exam Updated Vital Signs BP 117/75   Pulse 80   Temp (!) 100.4 F (38 C)   Resp 15   LMP 04/25/2021   SpO2 100%   Physical Exam Vitals and nursing note reviewed.  Constitutional:      General: She is not in acute distress.    Appearance: Normal appearance. She is not ill-appearing, toxic-appearing or diaphoretic.  HENT:      Head: Normocephalic and atraumatic.     Right Ear: External ear normal.     Left Ear: External ear normal.     Nose: Nose normal.     Mouth/Throat:     Mouth: Mucous membranes are moist.     Pharynx: Oropharynx is clear. No oropharyngeal exudate or posterior oropharyngeal erythema.  Eyes:     General: No scleral icterus.       Right eye: No discharge.        Left eye: No discharge.     Extraocular Movements: Extraocular movements intact.     Conjunctiva/sclera: Conjunctivae normal.  Cardiovascular:     Rate and Rhythm: Normal rate and regular rhythm.     Pulses: Normal pulses.     Heart sounds: Normal heart sounds. No murmur heard.   No friction rub. No gallop.  Pulmonary:     Effort: Pulmonary effort is normal. No respiratory distress.     Breath sounds: Normal breath sounds. No stridor. No wheezing, rhonchi or rales.  Abdominal:     General: Abdomen is flat.     Palpations: Abdomen is soft.     Tenderness: There is abdominal tenderness. There is right CVA tenderness and left CVA tenderness.     Comments: Abdomen is flat and soft.  Mild tenderness appreciated along the left side of the abdomen with moderate tenderness diffusely on the right side of the abdomen.  Bilateral CVA tenderness, right greater than left.  Musculoskeletal:        General: Normal range of motion.     Cervical back: Normal range of motion and neck supple. No tenderness.  Skin:    General: Skin is warm and dry.  Neurological:     General: No focal deficit present.     Mental Status: She is alert and oriented to person, place, and time.  Psychiatric:        Mood and Affect: Mood normal.        Behavior: Behavior normal.    ED Results / Procedures / Treatments   Labs (all labs ordered are listed, but only abnormal results are displayed) Labs Reviewed  CBC WITH DIFFERENTIAL/PLATELET - Abnormal; Notable for the following components:      Result Value   WBC 13.6 (*)    RBC 3.61 (*)    Hemoglobin  11.1 (*)    HCT 33.4 (*)    Neutro Abs 10.9 (*)    All other components within normal limits  COMPREHENSIVE METABOLIC PANEL - Abnormal; Notable for the following components:   Glucose, Bld 114 (*)    All other  components within normal limits  URINALYSIS, ROUTINE W REFLEX MICROSCOPIC - Abnormal; Notable for the following components:   APPearance CLOUDY (*)    Hgb urine dipstick LARGE (*)    Protein, ur 100 (*)    Leukocytes,Ua LARGE (*)    WBC, UA >50 (*)    Bacteria, UA RARE (*)    All other components within normal limits  RESP PANEL BY RT-PCR (FLU A&B, COVID) ARPGX2  URINE CULTURE  LIPASE, BLOOD  PREGNANCY, URINE  POC URINE PREG, ED    EKG None  Radiology CT Renal Stone Study  Result Date: 04/27/2021 CLINICAL DATA:  Flank pain.  Concern for kidney stone. EXAM: CT ABDOMEN AND PELVIS WITHOUT CONTRAST TECHNIQUE: Multidetector CT imaging of the abdomen and pelvis was performed following the standard protocol without IV contrast. COMPARISON:  CT abdomen pelvis dated 03/01/2021. FINDINGS: Evaluation of this exam is limited in the absence of intravenous contrast. Lower chest: The visualized lung bases are clear. No intra-abdominal free air or free fluid. Hepatobiliary: Multiple small liver cysts and subcentimeter hypodensities which are not characterized on this CT. No intrahepatic biliary dilatation. The gallbladder is unremarkable. Pancreas: Unremarkable. No pancreatic ductal dilatation or surrounding inflammatory changes. Spleen: Normal in size without focal abnormality. Adrenals/Urinary Tract: The adrenal glands unremarkable. Bilateral renal cysts in keeping with provided history of polycystic kidney disease. There has been interval placement of a right ureteral stent with proximal tip in the mid to upper pole collecting system and distal end within the urinary bladder. There is a 6 mm nonobstructing right renal upper pole calculus there is a 3 mm stone along the distal stent within the  urinary bladder, likely corresponding to the stone seen on the prior CT at the right ureterovesical junction. Several additional faint nonobstructing right renal calculi as well as a 3 mm nonobstructing stone in the inferior pole of the left kidney. There is no hydronephrosis on the left. The urinary bladder is unremarkable. Stomach/Bowel: There is no bowel obstruction or active inflammation. The appendix is normal. Vascular/Lymphatic: The abdominal aorta and IVC unremarkable. No portal venous gas. There is no adenopathy. Reproductive: The uterus is anteverted and grossly unremarkable. No adnexal masses. Other: None Musculoskeletal: No acute or significant osseous findings. IMPRESSION: 1. Interval placement of a right ureteral stent. A 3 mm stone along the distal stent within the urinary bladder likely corresponding to the right UVJ stone seen on the prior CT. Additional nonobstructing bilateral renal calculi. No hydronephrosis on the left. 2. Bilateral renal cysts in keeping with provided history of polycystic kidney disease. 3. No bowel obstruction. Normal appendix. Electronically Signed   By: Elgie Collard M.D.   On: 04/27/2021 22:19    Procedures Procedures   Medications Ordered in ED Medications  acetaminophen (TYLENOL) tablet 650 mg (650 mg Oral Given 04/27/21 1842)  lactated ringers bolus 1,650 mL (0 mLs Intravenous Stopped 04/27/21 2156)  cefTRIAXone (ROCEPHIN) 1 g in sodium chloride 0.9 % 100 mL IVPB (0 g Intravenous Stopped 04/27/21 2156)   ED Course  I have reviewed the triage vital signs and the nursing notes.  Pertinent labs & imaging results that were available during my care of the patient were reviewed by me and considered in my medical decision making (see chart for details).  Clinical Course as of 04/27/21 2247  Sat Apr 27, 2021  2021 Urinalysis, Routine w reflex microscopic Urine, Clean Catch(!) UA once again appears infectious.  Patient received ceftriaxone at her recent  admission with resolution of  her symptoms.  We will give a dose of ceftriaxone.  Will give patient a 30 cc/kg IV fluid bolus as well. [LJ]  2235 Patient discussed with Dr. Venetia Constable who is on-call for urology.  Feels that this is likely once again a complicated UTI.  Stent is still in place.  Recommends patient follow-up with urology outpatient. [LJ]    Clinical Course User Index [LJ] Placido Sou, PA-C   MDM Rules/Calculators/A&P                          Pt is a 30 y.o. female who presents to the emergency department due to dysuria, abdominal pain, flank pain.  Labs: CBC with a white count of 13.6, hemoglobin of 11.1, neutrophils of 10.9. CMP with a glucose of 114. Respiratory panel is negative. UA with large hemoglobin, 100 protein, large leukocytes, 21-50 red blood cells, greater than 50 white blood cells, rare bacteria.  Imaging: CT renal stone study showed  IMPRESSION: 1. Interval placement of a right ureteral stent. A 3 mm stone along the distal stent within the urinary bladder likely corresponding to the right UVJ stone seen on the prior CT. Additional nonobstructing bilateral renal calculi. No hydronephrosis on the left. 2. Bilateral renal cysts in keeping with provided history of polycystic kidney disease. 3. No bowel obstruction. Normal appendix.  I, Placido Sou, PA-C, personally reviewed and evaluated these images and lab results as part of my medical decision-making.  Patient admitted 2 months ago with similar complaints.  She was found to have an infected right-sided ureteral stone and had a ureteral stent placed.  Due to lack of funds she was unable to follow-up with urology and still has that stent in place.  Patient states that 2 to 3 days ago she began experiencing dysuria, fevers, abdominal pain, flank pain once again.  Reports intermittent nausea/vomiting.  UA once again appears infectious.  Culture sent.  Patient discussed with urology who recommend outpatient  follow-up.  Patient given Rocephin, Tylenol, as well as IV fluids.  She notes mild relief in her symptoms.  Feel that she would benefit from admission for further antibiotics and work-up.  We will discuss with the medicine team.  Note: Portions of this report may have been transcribed using voice recognition software. Every effort was made to ensure accuracy; however, inadvertent computerized transcription errors may be present.   Final Clinical Impression(s) / ED Diagnoses Final diagnoses:  Acute pyelonephritis   Rx / DC Orders ED Discharge Orders     None        Placido Sou, PA-C 04/27/21 2248    Placido Sou, PA-C 04/27/21 2333    Alvira Monday, MD 04/29/21 2135

## 2021-04-27 NOTE — H&P (Addendum)
History and Physical    Colleen Oneill IFO:[id number] DOB: 09/21/1990 DOA: 04/27/2021  PCP: Bo Merino I, NP   Patient coming from: Home  Chief Complaint: Flank pain and pain with urination. Fever, nausea, vomiting  HPI: Colleen Oneill is a 30 y.o. female with medical history significant for renal stones and had stent placed in September during admission. Presents for evaluation of 2 day history of urinary sick, dysuria, nausea vomiting and fever.  She reports is is how she felt when she had the infected renal stone in the mid September.  At that time she was admitted and had an obstructing nephrolithiasis and a right lighted stent was placed by urology.  Patient has not followed up with urology and stent has not been removed.  She reports has been having a low-grade fever to 101 degrees at home.  She reports the pain is in the right lower abdomen in the suprapubic region and in her right flank.  It is progressively worsened over the last 24 hours.  She reports pain is a 9 out of 10 at its worst.  She reports she completed all the antibiotics as prescribed and she was discharged in the hospital previously.  She has had no known sick contacts.  She denies any cough, congestion, upper respiratory symptoms, chest pain, palpitations, numbness or weakness of extremities.  She has not been to tolerate p.o. intake today secondary to nausea and vomiting.  She has not had any vomiting emergency room.  She reports that she last vomited around noon at home but has not had any p.o. intake since that time. She denies tobacco or alcohol use.  ED Course: Ms. Deckman is been hemodynamically stable in the emergency room.  CT scan shows right-sided stent and 3 mm stone with pyelonephritis on the right.  Lab work shows a BBC 13,600 hemoglobin 11.1 hematocrit 33.4 platelets 3 26,000 sodium 135 potassium 3.5 chloride 102 bicarb 22 creatinine 0.64 BUN 15 glucose 114 alk phos is 44 AST 17 ALT 9 lipase 20.  Patient is given  IV fluids in the emergency room and started on Rocephin for antibiotic coverage.  Hospitalist service been asked admit for further management  Review of Systems:  General: Reports fever, chills. Denies weight loss, night sweats.  Denies dizziness. Reports decreased appetite HENT: Denies head trauma, headache, denies change in hearing, tinnitus.  Denies nasal congestion or bleeding.  Denies sore throat.  Denies difficulty swallowing Eyes: Denies blurry vision, pain in eye, drainage.  Denies discoloration of eyes. Neck: Denies pain.  Denies swelling.  Denies pain with movement. Cardiovascular: Denies chest pain, palpitations. Denies edema.  Denies orthopnea Respiratory: Denies shortness of breath, cough. Denies wheezing.  Denies sputum production Gastrointestinal: Denies swelling. Reports nausea, vomiting. Denies diarrhea.  Denies melena.  Denies hematemesis. Musculoskeletal: Denies limitation of movement.  Denies deformity or swelling. Denies arthralgias or myalgias. Genitourinary: Reports right flank pain, urinary frequency, dysuria.  Skin: Denies rash.  Denies petechiae, purpura, ecchymosis. Neurological: Denies syncope. Denies seizure activity. Denies weakness Psychiatric: Denies depression, anxiety. Denies hallucinations.  Past Medical History:  Diagnosis Date   Medical history non-contributory    Polycystic kidney disease     Past Surgical History:  Procedure Laterality Date   CESAREAN SECTION     CESAREAN SECTION N/A 08/09/2012   Procedure: Repeat CESAREAN SECTION of baby boy  at [place] 9/9;  Surgeon: Frederico Hamman, MD;  Location: [facility];  Service: Obstetrics;  Laterality: N/A;   CESAREAN  SECTION N/A 08/23/2013   Procedure: CESAREAN SECTION;  Surgeon: Shelly Bombard, MD;  Location: [facility];  Service: Obstetrics;  Laterality: N/A;   CYSTOSCOPY/URETEROSCOPY/HOLMIUM LASER/STENT PLACEMENT Right 03/02/2021   Procedure: CYSTOSCOPY; RIGHT RETROGRADE PYELOGRAM; RIGTH URETERAL STENT  PLACEMENT;  Surgeon: Remi Haggard, MD;  Location: [facility];  Service: Urology;  Laterality: Right;    Social History  reports that she has never smoked. She has never used smokeless tobacco. She reports that she does not drink alcohol and does not use drugs.  No Known Allergies  Family History  Problem Relation Age of Onset   Kidney disease Father    Diabetes Maternal Grandmother    Other Neg Hx      Prior to Admission medications   Medication Sig Start Date End Date Taking? Authorizing Provider  acetaminophen (TYLENOL) 325 MG tablet Take 2 tablets (650 mg total) by mouth every 6 (six) hours as needed for mild pain or moderate pain. 03/04/21   Arrien, Jimmy Picket, MD  meclizine (ANTIVERT) 25 MG tablet Take 2 tablets (50 mg total) by mouth 3 (three) times daily as needed for dizziness or nausea. 03/08/21   Passmore, Jake Church I, NP  traMADol (ULTRAM) 50 MG tablet Take 1 tablet (50 mg total) by mouth every 6 (six) hours as needed for severe pain. 03/04/21   Tawni Millers, MD    Physical Exam: Vitals:   04/27/21 1828 04/27/21 2000 04/27/21 2230  BP: 105/90 108/73 117/75  Pulse: 99 (!) 105 80  Resp: 16 14 15   Temp: (!) 100.4 F (38 C)    SpO2: 98% 99% 100%    Constitutional: NAD, calm, comfortable Vitals:   04/27/21 1828 04/27/21 2000 04/27/21 2230  BP: 105/90 108/73 117/75  Pulse: 99 (!) 105 80  Resp: 16 14 15   Temp: (!) 100.4 F (38 C)    SpO2: 98% 99% 100%   General: WDWN, Alert and oriented x3.  Eyes: EOMI, PERRL, conjunctivae normal.  Sclera nonicteric HENT:  [place]/AT, external ears normal.  Nares patent without epistasis.  Mucous membranes are dry Neck: Soft, normal range of motion, supple, no masses,Trachea midline Respiratory: clear to auscultation bilaterally, no wheezing, no crackles. Normal respiratory effort. No accessory muscle use.  Cardiovascular: Regular rate and rhythm, no murmurs / rubs / gallops. No extremity edema. 2+ pedal pulses.  Abdomen:  Soft, Right lower quadrant and suprapubic tenderness, nondistended, no rebound or guarding.  No masses palpated. No hepatosplenomegaly. Bowel sounds normoactive. Right CVA tenderness to percussion Musculoskeletal: FROM. no cyanosis. No joint deformity upper and lower extremities. Normal muscle tone.  Skin: Warm, dry, intact no rashes, lesions, ulcers. No induration Neurologic: CN 2-12 grossly intact.  Normal speech. Moves all extremities spontaneously. No tremor Psychiatric: Normal judgment and insight.  Normal mood.    Labs on Admission: I have personally reviewed following labs and imaging studies  CBC: Recent Labs  Lab 04/27/21 1833  WBC 13.6*  NEUTROABS 10.9*  HGB 11.1*  HCT 33.4*  MCV 92.5  PLT 263    Basic Metabolic Panel: Recent Labs  Lab 04/27/21 1833  NA 135  K 3.5  CL 102  CO2 22  GLUCOSE 114*  BUN 15  CREATININE 0.64  CALCIUM 9.6    GFR: CrCl cannot be calculated (Unknown ideal weight.).  Liver Function Tests: Recent Labs  Lab 04/27/21 1833  AST 17  ALT 9  ALKPHOS 44  BILITOT 0.8  PROT 7.5  ALBUMIN 3.8    Urine analysis:  Component Value Date/Time   COLORURINE YELLOW 04/27/2021 1833   APPEARANCEUR CLOUDY (A) 04/27/2021 1833   LABSPEC 1.014 04/27/2021 1833   PHURINE 7.0 04/27/2021 1833   GLUCOSEU NEGATIVE 04/27/2021 1833   HGBUR LARGE (A) 04/27/2021 1833   BILIRUBINUR NEGATIVE 04/27/2021 1833   BILIRUBINUR negative 03/08/2021 1135   BILIRUBINUR NEGATIVE 08/03/2013 1515   KETONESUR NEGATIVE 04/27/2021 1833   PROTEINUR 100 (A) 04/27/2021 1833   UROBILINOGEN 2.0 (A) 03/08/2021 1135   UROBILINOGEN 2.0 (H) 08/22/2013 2004   NITRITE NEGATIVE 04/27/2021 1833   LEUKOCYTESUR LARGE (A) 04/27/2021 1833    Radiological Exams on Admission: CT Renal Stone Study  Result Date: 04/27/2021 CLINICAL DATA:  Flank pain.  Concern for kidney stone. EXAM: CT ABDOMEN AND PELVIS WITHOUT CONTRAST TECHNIQUE: Multidetector CT imaging of the abdomen and pelvis  was performed following the standard protocol without IV contrast. COMPARISON:  CT abdomen pelvis dated 03/01/2021. FINDINGS: Evaluation of this exam is limited in the absence of intravenous contrast. Lower chest: The visualized lung bases are clear. No intra-abdominal free air or free fluid. Hepatobiliary: Multiple small liver cysts and subcentimeter hypodensities which are not characterized on this CT. No intrahepatic biliary dilatation. The gallbladder is unremarkable. Pancreas: Unremarkable. No pancreatic ductal dilatation or surrounding inflammatory changes. Spleen: Normal in size without focal abnormality. Adrenals/Urinary Tract: The adrenal glands unremarkable. Bilateral renal cysts in keeping with provided history of polycystic kidney disease. There has been interval placement of a right ureteral stent with proximal tip in the mid to upper pole collecting system and distal end within the urinary bladder. There is a 6 mm nonobstructing right renal upper pole calculus there is a 3 mm stone along the distal stent within the urinary bladder, likely corresponding to the stone seen on the prior CT at the right ureterovesical junction. Several additional faint nonobstructing right renal calculi as well as a 3 mm nonobstructing stone in the inferior pole of the left kidney. There is no hydronephrosis on the left. The urinary bladder is unremarkable. Stomach/Bowel: There is no bowel obstruction or active inflammation. The appendix is normal. Vascular/Lymphatic: The abdominal aorta and IVC unremarkable. No portal venous gas. There is no adenopathy. Reproductive: The uterus is anteverted and grossly unremarkable. No adnexal masses. Other: None Musculoskeletal: No acute or significant osseous findings. IMPRESSION: 1. Interval placement of a right ureteral stent. A 3 mm stone along the distal stent within the urinary bladder likely corresponding to the right UVJ stone seen on the prior CT. Additional nonobstructing  bilateral renal calculi. No hydronephrosis on the left. 2. Bilateral renal cysts in keeping with provided history of polycystic kidney disease. 3. No bowel obstruction. Normal appendix. Electronically Signed   By: Anner Crete M.D.   On: 04/27/2021 22:19     Assessment/Plan Principal Problem:   Pyelonephritis Ms. Pina is admitted to [place] floor.  She is started on Rocephin for antibiotic coverage. IV fluid hydration with LR at 100 ml/hr  Tramadol prescribed for moderate pain and Dilaudid IV for severe pain x3 doses Clear liquid diet as tolerated.  We will advance diet slowly as tolerated Will need to discuss with urology in am if stent needs to be removed now vs as outpatient once infection treated.  Active Problems:   Nausea and vomiting Antiemetic provided as needed.  Patient reports she would like to try drinking some water and clear fluids   DVT prophylaxis: Padua score low, early ambulation for DVT prophylaxis.   Code Status:   Full Code  Family Communication:  Diagnosis and plan discussed with patient.  She verbalized understanding and agrees with plan.  Further recommendation follow as clinical indicated Disposition Plan:   Patient is from:  Home  Anticipated DC to:  Home  Anticipated DC date:  Anticipate 2 midnight stay in the hospital  Admission status:  Inpatient   Yevonne Aline Berda Shelvin MD [facility]  How to contact the [facility] Attending or Consulting provider [place] or covering provider during after hours [place], for this patient?   Check the care team in [facility] and look for a) attending/consulting [facility] provider listed and b) the [facility] team listed Log into [email or web address] and use [place]'s universal password to access. If you do not have the password, please contact the hospital operator. Locate the [facility] provider you are looking for under [facility] and page to a number that you can be directly reached. If you still have difficulty reaching the provider,  please page the [facility] (Director on Call) for the Hospitalists listed on amion for assistance.  04/27/2021, 11:21 PM

## 2021-04-28 DIAGNOSIS — N390 Urinary tract infection, site not specified: Secondary | ICD-10-CM

## 2021-04-28 DIAGNOSIS — R112 Nausea with vomiting, unspecified: Secondary | ICD-10-CM

## 2021-04-28 DIAGNOSIS — N10 Acute tubulo-interstitial nephritis: Secondary | ICD-10-CM

## 2021-04-28 LAB — CBC
HCT: 28.5 % — ABNORMAL LOW (ref 36.0–46.0)
Hemoglobin: 9.5 g/dL — ABNORMAL LOW (ref 12.0–15.0)
MCH: 31 pg (ref 26.0–34.0)
MCHC: 33.3 g/dL (ref 30.0–36.0)
MCV: 93.1 fL (ref 80.0–100.0)
Platelets: 277 10*3/uL (ref 150–400)
RBC: 3.06 MIL/uL — ABNORMAL LOW (ref 3.87–5.11)
RDW: 13 % (ref 11.5–15.5)
WBC: 11.8 10*3/uL — ABNORMAL HIGH (ref 4.0–10.5)
nRBC: 0 % (ref 0.0–0.2)

## 2021-04-28 LAB — BASIC METABOLIC PANEL
Anion gap: 7 (ref 5–15)
BUN: 13 mg/dL (ref 6–20)
CO2: 23 mmol/L (ref 22–32)
Calcium: 8.7 mg/dL — ABNORMAL LOW (ref 8.9–10.3)
Chloride: 104 mmol/L (ref 98–111)
Creatinine, Ser: 0.6 mg/dL (ref 0.44–1.00)
GFR, Estimated: >60 mL/min (ref >60)
Glucose, Bld: 99 mg/dL (ref 70–99)
Potassium: 3.6 mmol/L (ref 3.5–5.1)
Sodium: 134 mmol/L — ABNORMAL LOW (ref 135–145)

## 2021-04-28 MED ORDER — HYDROMORPHONE HCL 1 MG/ML IJ SOLN
0.5000 mg | INTRAMUSCULAR | Status: DC | PRN
  Administered 2021-04-30: 0.5 mg via INTRAVENOUS
  Filled 2021-04-28: qty 1

## 2021-04-28 MED ORDER — KETOROLAC TROMETHAMINE 15 MG/ML IJ SOLN
15.0000 mg | Freq: Four times a day (QID) | INTRAMUSCULAR | Status: DC | PRN
  Administered 2021-04-29: 15 mg via INTRAVENOUS
  Filled 2021-04-28 (×2): qty 1

## 2021-04-28 NOTE — Consult Note (Signed)
Urology Consult   Physician requesting consult: Wendee Beavers MD  Reason for consult: UTI with stent in place   History of Present Illness: Colleen Oneill is a 30 y.o. with history of nephrolithiasis previously stented for infected obstructing stone on 03/02/21. At that time she grew pansensitive E.coli. She was discharged with plans for definitive stone management 2 weeks later but unfortunately never followed up with urology. She presented to ED due to 2 days of dysuria, flank pain, and fevers at home. Prior to this she states she had done well with the stent with minimal symptoms.   Initial labwork notable for mild leukocytosis to 13.6 and creatinine at her baseline of 0.6. CT imaging revealed appropriately placed right ureteral stent without new hydronephrosis. She had a low grade fever but was otherwise nontoxic appearing and hemodynamically stable.   Past Medical History:  Diagnosis Date   Medical history non-contributory    Polycystic kidney disease     Past Surgical History:  Procedure Laterality Date   CESAREAN SECTION     CESAREAN SECTION N/A 08/09/2012   Procedure: Repeat CESAREAN SECTION of baby boy  at [place] 9/9;  Surgeon: Frederico Hamman, MD;  Location: [facility];  Service: Obstetrics;  Laterality: N/A;   CESAREAN SECTION N/A 08/23/2013   Procedure: CESAREAN SECTION;  Surgeon: Shelly Bombard, MD;  Location: [facility];  Service: Obstetrics;  Laterality: N/A;   CYSTOSCOPY/URETEROSCOPY/HOLMIUM LASER/STENT PLACEMENT Right 03/02/2021   Procedure: CYSTOSCOPY; RIGHT RETROGRADE PYELOGRAM; RIGTH URETERAL STENT PLACEMENT;  Surgeon: Remi Haggard, MD;  Location: [facility];  Service: Urology;  Laterality: Right;     Current Hospital Medications:  Home meds:  No current facility-administered medications on file prior to encounter.   Current Outpatient Medications on File Prior to Encounter  Medication Sig Dispense Refill   acetaminophen (TYLENOL) 325 MG tablet Take 2 tablets (650 mg  total) by mouth every 6 (six) hours as needed for mild pain or moderate pain.     meclizine (ANTIVERT) 25 MG tablet Take 2 tablets (50 mg total) by mouth 3 (three) times daily as needed for dizziness or nausea. 60 tablet 0   traMADol (ULTRAM) 50 MG tablet Take 1 tablet (50 mg total) by mouth every 6 (six) hours as needed for severe pain. 10 tablet 0     Scheduled Meds: Continuous Infusions:  cefTRIAXone (ROCEPHIN)  IV     lactated ringers 100 mL/hr at 04/28/21 0944   PRN Meds:.acetaminophen **OR** acetaminophen, HYDROmorphone (DILAUDID) injection, ondansetron **OR** ondansetron (ZOFRAN) IV, senna-docusate, traMADol  Allergies: No Known Allergies  Family History  Problem Relation Age of Onset   Kidney disease Father    Diabetes Maternal Grandmother    Other Neg Hx     Social History:  reports that she has never smoked. She has never used smokeless tobacco. She reports that she does not drink alcohol and does not use drugs.  ROS: A complete review of systems was performed.  All systems are negative except for pertinent findings as noted.  Physical Exam:  Vital signs in last 24 hours: Temp:  [98.8 F (37.1 C)-100.4 F (38 C)] 99.6 F (37.6 C) (11/06 0718) Pulse Rate:  [80-105] 89 (11/06 1000) Resp:  [14-18] 16 (11/06 1000) BP: (104-117)/(66-90) 108/76 (11/06 1000) SpO2:  [98 %-100 %] 100 % (11/06 1000) Constitutional:  Alert and oriented, No acute distress Cardiovascular: Regular rate and rhythm, No JVD Respiratory: Normal respiratory effort, Lungs clear bilaterally GI: Abdomen is soft, nontender, nondistended,  no abdominal masses GU: No CVA tenderness Lymphatic: No lymphadenopathy Neurologic: Grossly intact, no focal deficits Psychiatric: Normal mood and affect  Laboratory Data:  Recent Labs    04/27/21 1833 04/28/21 0717  WBC 13.6* 11.8*  HGB 11.1* 9.5*  HCT 33.4* 28.5*  PLT 326 277    Recent Labs    04/27/21 1833 04/28/21 0717  NA 135 134*  K 3.5 3.6  CL  102 104  GLUCOSE 114* 99  BUN 15 13  CALCIUM 9.6 8.7*  CREATININE 0.64 0.60     Results for orders placed or performed during the hospital encounter of 04/27/21 (from the past 24 hour(s))  CBC with Differential     Status: Abnormal   Collection Time: 04/27/21  6:33 PM  Result Value Ref Range   WBC 13.6 (H) 4.0 - 10.5 K/uL   RBC 3.61 (L) 3.87 - 5.11 MIL/uL   Hemoglobin 11.1 (L) 12.0 - 15.0 g/dL   HCT 33.4 (L) 36.0 - 46.0 %   MCV 92.5 80.0 - 100.0 fL   MCH 30.7 26.0 - 34.0 pg   MCHC 33.2 30.0 - 36.0 g/dL   RDW 12.8 11.5 - 15.5 %   Platelets 326 150 - 400 K/uL   nRBC 0.0 0.0 - 0.2 %   Neutrophils Relative % 81 %   Neutro Abs 10.9 (H) 1.7 - 7.7 K/uL   Lymphocytes Relative 13 %   Lymphs Abs 1.8 0.7 - 4.0 K/uL   Monocytes Relative 6 %   Monocytes Absolute 0.9 0.1 - 1.0 K/uL   Eosinophils Relative 0 %   Eosinophils Absolute 0.0 0.0 - 0.5 K/uL   Basophils Relative 0 %   Basophils Absolute 0.0 0.0 - 0.1 K/uL   Immature Granulocytes 0 %   Abs Immature Granulocytes 0.04 0.00 - 0.07 K/uL  Comprehensive metabolic panel     Status: Abnormal   Collection Time: 04/27/21  6:33 PM  Result Value Ref Range   Sodium 135 135 - 145 mmol/L   Potassium 3.5 3.5 - 5.1 mmol/L   Chloride 102 98 - 111 mmol/L   CO2 22 22 - 32 mmol/L   Glucose, Bld 114 (H) 70 - 99 mg/dL   BUN 15 6 - 20 mg/dL   Creatinine, Ser 0.64 0.44 - 1.00 mg/dL   Calcium 9.6 8.9 - 10.3 mg/dL   Total Protein 7.5 6.5 - 8.1 g/dL   Albumin 3.8 3.5 - 5.0 g/dL   AST 17 15 - 41 U/L   ALT 9 0 - 44 U/L   Alkaline Phosphatase 44 38 - 126 U/L   Total Bilirubin 0.8 0.3 - 1.2 mg/dL   GFR, Estimated >60 >60 mL/min   Anion gap 11 5 - 15  Urinalysis, Routine w reflex microscopic Urine, Clean Catch     Status: Abnormal   Collection Time: 04/27/21  6:33 PM  Result Value Ref Range   Color, Urine YELLOW YELLOW   APPearance CLOUDY (A) CLEAR   Specific Gravity, Urine 1.014 1.005 - 1.030   pH 7.0 5.0 - 8.0   Glucose, UA NEGATIVE NEGATIVE  mg/dL   Hgb urine dipstick LARGE (A) NEGATIVE   Bilirubin Urine NEGATIVE NEGATIVE   Ketones, ur NEGATIVE NEGATIVE mg/dL   Protein, ur 100 (A) NEGATIVE mg/dL   Nitrite NEGATIVE NEGATIVE   Leukocytes,Ua LARGE (A) NEGATIVE   RBC / HPF 21-50 0 - 5 RBC/hpf   WBC, UA >50 (H) 0 - 5 WBC/hpf   Bacteria, UA RARE (A) NONE SEEN  Squamous Epithelial / LPF 6-10 0 - 5   Mucus PRESENT   Lipase, blood     Status: None   Collection Time: 04/27/21  6:33 PM  Result Value Ref Range   Lipase 20 11 - 51 U/L  Resp Panel by RT-PCR (Flu A&B, Covid) Nasopharyngeal Swab     Status: None   Collection Time: 04/27/21  6:34 PM   Specimen: Nasopharyngeal Swab; Nasopharyngeal(NP) swabs in vial transport medium  Result Value Ref Range   SARS Coronavirus 2 by RT PCR NEGATIVE NEGATIVE   Influenza A by PCR NEGATIVE NEGATIVE   Influenza B by PCR NEGATIVE NEGATIVE  Pregnancy, urine     Status: None   Collection Time: 04/27/21  9:30 PM  Result Value Ref Range   Preg Test, Ur NEGATIVE NEGATIVE  Basic metabolic panel     Status: Abnormal   Collection Time: 04/28/21  7:17 AM  Result Value Ref Range   Sodium 134 (L) 135 - 145 mmol/L   Potassium 3.6 3.5 - 5.1 mmol/L   Chloride 104 98 - 111 mmol/L   CO2 23 22 - 32 mmol/L   Glucose, Bld 99 70 - 99 mg/dL   BUN 13 6 - 20 mg/dL   Creatinine, Ser 0.60 0.44 - 1.00 mg/dL   Calcium 8.7 (L) 8.9 - 10.3 mg/dL   GFR, Estimated >60 >60 mL/min   Anion gap 7 5 - 15  CBC     Status: Abnormal   Collection Time: 04/28/21  7:17 AM  Result Value Ref Range   WBC 11.8 (H) 4.0 - 10.5 K/uL   RBC 3.06 (L) 3.87 - 5.11 MIL/uL   Hemoglobin 9.5 (L) 12.0 - 15.0 g/dL   HCT 28.5 (L) 36.0 - 46.0 %   MCV 93.1 80.0 - 100.0 fL   MCH 31.0 26.0 - 34.0 pg   MCHC 33.3 30.0 - 36.0 g/dL   RDW 13.0 11.5 - 15.5 %   Platelets 277 150 - 400 K/uL   nRBC 0.0 0.0 - 0.2 %   Recent Results (from the past 240 hour(s))  Resp Panel by RT-PCR (Flu A&B, Covid) Nasopharyngeal Swab     Status: None   Collection  Time: 04/27/21  6:34 PM   Specimen: Nasopharyngeal Swab; Nasopharyngeal(NP) swabs in vial transport medium  Result Value Ref Range Status   SARS Coronavirus 2 by RT PCR NEGATIVE NEGATIVE Final    Comment: (NOTE) SARS-CoV-2 target nucleic acids are NOT DETECTED.  The SARS-CoV-2 RNA is generally detectable in upper respiratory specimens during the acute phase of infection. The lowest concentration of SARS-CoV-2 viral copies this assay can detect is 138 copies/mL. A negative result does not preclude SARS-Cov-2 infection and should not be used as the sole basis for treatment or other patient management decisions. A negative result may occur with  improper specimen collection/handling, submission of specimen other than nasopharyngeal swab, presence of viral mutation(s) within the areas targeted by this assay, and inadequate number of viral copies(<138 copies/mL). A negative result must be combined with clinical observations, patient history, and epidemiological information. The expected result is Negative.  Fact Sheet for Patients:  EntrepreneurPulse.com.au  Fact Sheet for Healthcare Providers:  IncredibleEmployment.be  This test is no t yet approved or cleared by the [place] FDA and  has been authorized for detection and/or diagnosis of SARS-CoV-2 by FDA under an Emergency Use Authorization (EUA). This EUA will remain  in effect (meaning this test can be used) for the duration of the COVID-19  declaration under Section 564(b)(1) of the Act, 21 U.S.C.section 360bbb-3(b)(1), unless the authorization is terminated  or revoked sooner.       Influenza A by PCR NEGATIVE NEGATIVE Final   Influenza B by PCR NEGATIVE NEGATIVE Final    Comment: (NOTE) The Xpert Xpress SARS-CoV-2/FLU/RSV plus assay is intended as an aid in the diagnosis of influenza from Nasopharyngeal swab specimens and should not be used as a sole basis for treatment. Nasal washings  and aspirates are unacceptable for Xpert Xpress SARS-CoV-2/FLU/RSV testing.  Fact Sheet for Patients: EntrepreneurPulse.com.au  Fact Sheet for Healthcare Providers: IncredibleEmployment.be  This test is not yet approved or cleared by the [place] FDA and has been authorized for detection and/or diagnosis of SARS-CoV-2 by FDA under an Emergency Use Authorization (EUA). This EUA will remain in effect (meaning this test can be used) for the duration of the COVID-19 declaration under Section 564(b)(1) of the Act, 21 U.S.C. section 360bbb-3(b)(1), unless the authorization is terminated or revoked.  Performed at [facility], [place] [address], [place], [place] [address]     Renal Function: Recent Labs    04/27/21 1833 04/28/21 0717  CREATININE 0.64 0.60   CrCl cannot be calculated (Unknown ideal weight.).  Radiologic Imaging: CT Renal Stone Study  Result Date: 04/27/2021 CLINICAL DATA:  Flank pain.  Concern for kidney stone. EXAM: CT ABDOMEN AND PELVIS WITHOUT CONTRAST TECHNIQUE: Multidetector CT imaging of the abdomen and pelvis was performed following the standard protocol without IV contrast. COMPARISON:  CT abdomen pelvis dated 03/01/2021. FINDINGS: Evaluation of this exam is limited in the absence of intravenous contrast. Lower chest: The visualized lung bases are clear. No intra-abdominal free air or free fluid. Hepatobiliary: Multiple small liver cysts and subcentimeter hypodensities which are not characterized on this CT. No intrahepatic biliary dilatation. The gallbladder is unremarkable. Pancreas: Unremarkable. No pancreatic ductal dilatation or surrounding inflammatory changes. Spleen: Normal in size without focal abnormality. Adrenals/Urinary Tract: The adrenal glands unremarkable. Bilateral renal cysts in keeping with provided history of polycystic kidney disease. There has been interval placement of a right ureteral stent with  proximal tip in the mid to upper pole collecting system and distal end within the urinary bladder. There is a 6 mm nonobstructing right renal upper pole calculus there is a 3 mm stone along the distal stent within the urinary bladder, likely corresponding to the stone seen on the prior CT at the right ureterovesical junction. Several additional faint nonobstructing right renal calculi as well as a 3 mm nonobstructing stone in the inferior pole of the left kidney. There is no hydronephrosis on the left. The urinary bladder is unremarkable. Stomach/Bowel: There is no bowel obstruction or active inflammation. The appendix is normal. Vascular/Lymphatic: The abdominal aorta and IVC unremarkable. No portal venous gas. There is no adenopathy. Reproductive: The uterus is anteverted and grossly unremarkable. No adnexal masses. Other: None Musculoskeletal: No acute or significant osseous findings. IMPRESSION: 1. Interval placement of a right ureteral stent. A 3 mm stone along the distal stent within the urinary bladder likely corresponding to the right UVJ stone seen on the prior CT. Additional nonobstructing bilateral renal calculi. No hydronephrosis on the left. 2. Bilateral renal cysts in keeping with provided history of polycystic kidney disease. 3. No bowel obstruction. Normal appendix. Electronically Signed   By: Anner Crete M.D.   On: 04/27/2021 22:19    I independently reviewed the above imaging studies.  Impression/Recommendation: 30 y/o female with UTI in the setting of  indwelling ureteral stent place 2 months ago for infected obstruction.  -No indication for acute urologic intervention as stent is in appropriate position on imaging -Follow up new urine culture  -Agree with CTX while sensitives are pending; grew pan sensitive E coli in the past.  -Discussed importance of following up with urology after this admission for endoscopic management of her nephrolithiasis. We discussed the risk of retained  stent if she does not follow up. She is aware this could mean long term damage to her kidney and even kidney loss.    Erisa Mehlman 04/28/2021, 10:25 AM

## 2021-04-28 NOTE — Progress Notes (Signed)
New Admission Note:   Arrival Method: from ED via stretcher Mental Orientation: alert and oriented x4 Telemetry: N/A Assessment: Completed Skin: Intact IV: RFA, infusing with LR at 100cc/hr Pain: 4/10, bearable pain. Will asked if she needed it. Tubes: None Safety Measures: Safety Fall Prevention Plan has been discussed  Admission: to be completed 5 [place] Orientation: Patient has been orientated to the room, unit and staff.   Family: none at bedside  Orders to be reviewed and implemented. Will continue to monitor the patient. Call light has been placed within reach and bed alarm has been activated.

## 2021-04-28 NOTE — Progress Notes (Signed)
PROGRESS NOTE  Colleen Oneill IWO:[id number] DOB: 1991/01/24   PCP: Orion Crook I, NP  Patient is from: Home  DOA: 04/27/2021 LOS: 1  Chief complaints:  Dysuria, nausea, vomiting, fever, right flank pain  Brief Narrative / Interim history: 30 year old F with PMH of right pyelonephritis in the setting of right renal stone s/p ureteral stent in 02/2021 and polycystic kidney disease presenting with dysuria, nausea, vomiting, fever, right flank and RLQ pain, and admitted for acute right pyelonephritis.  Cultures obtained.  She was a started on IV ceftriaxone.  Of note, patient did not follow-up with urology for definitive treatment of her ureteral stone after stent placement due to lack of insurance.   Subjective: Seen and examined earlier this morning.  No major events overnight of this morning.  Still with right flank pain.  She rates her pain 8/10.  Describes the pain as sharp.  She also reports nausea but no emesis.  She denies chest pain or dyspnea.  Objective: Vitals:   04/28/21 0718 04/28/21 0800 04/28/21 1000 04/28/21 1300  BP:  108/69 108/76 98/65  Pulse:  85 89 93  Resp:  16 16 16   Temp: 99.6 F (37.6 C)     TempSrc: Oral     SpO2:  100% 100% 100%    Intake/Output Summary (Last 24 hours) at 04/28/2021 1433 Last data filed at 04/28/2021 0933 Gross per 24 hour  Intake 1951.24 ml  Output --  Net 1951.24 ml   There were no vitals filed for this visit.  Examination:  GENERAL: No apparent distress.  Nontoxic. HEENT: MMM.  Vision and hearing grossly intact.  NECK: Supple.  No apparent JVD.  RESP:  No IWOB.  Fair aeration bilaterally. CVS:  RRR. Heart sounds normal.  ABD/GI/GU: BS+. Abd soft.  RLQ and right-sided CVA tenderness MSK/EXT:  Moves extremities. No apparent deformity. No edema.  SKIN: no apparent skin lesion or wound NEURO: Awake, alert and oriented appropriately.  No apparent focal neuro deficit. PSYCH: Calm. Normal affect.   Procedures:   None  Microbiology summarized: COVID-19 and influenza PCR nonreactive. Urine culture pending.    Assessment & Plan: Acute right pyelonephritis/complicated UTI and patient with ureteral stent about 2 months ago: Improving. -Continue IV ceftriaxone -Follow urine culture -Tylenol as needed for mild pain -Toradol 15 mg as needed for moderate pain -V Dilaudid as needed for severe pain.  Decreased from 1 g to 0.5 mg. -Appreciate input by urology-plan for outpatient follow-up  Nausea and vomiting: Likely due to the above. -Treat pyelonephritis as above -IV fluid and antiemetics  There is no height or weight on file to calculate BMI.         DVT prophylaxis:  Place and maintain sequential compression device Start: 04/28/21 1432  Code Status: Full code Family Communication: Patient and/or RN. Available if any question.  Level of care: Med-Surg Status is: Inpatient  Remains inpatient appropriate because: Need for IV antibiotics and IV pain medication for acute pyelonephritis and related pain.  Also need IV fluids for intractable nausea and vomiting       Consultants:  Urology   Sch Meds:  Scheduled Meds: Continuous Infusions:  cefTRIAXone (ROCEPHIN)  IV 2 g (04/28/21 1347)   lactated ringers 100 mL/hr at 04/28/21 0944   PRN Meds:.acetaminophen **OR** acetaminophen, HYDROmorphone (DILAUDID) injection, ketorolac, ondansetron **OR** ondansetron (ZOFRAN) IV, senna-docusate  Antimicrobials: Anti-infectives (From admission, onward)    Start     Dose/Rate Route Frequency Ordered Stop   04/28/21 1400  cefTRIAXone (ROCEPHIN) 2 g in sodium chloride 0.9 % 100 mL IVPB        2 g 200 mL/hr over 30 Minutes Intravenous Every 24 hours 04/27/21 2335     04/28/21 1000  cefTRIAXone (ROCEPHIN) 1 g in sodium chloride 0.9 % 100 mL IVPB  Status:  Discontinued        1 g 200 mL/hr over 30 Minutes Intravenous Every 24 hours 04/27/21 2333 04/27/21 2335   04/27/21 2030  cefTRIAXone  (ROCEPHIN) 1 g in sodium chloride 0.9 % 100 mL IVPB        1 g 200 mL/hr over 30 Minutes Intravenous  Once 04/27/21 2021 04/27/21 2156        I have personally reviewed the following labs and images: CBC: Recent Labs  Lab 04/27/21 1833 04/28/21 0717  WBC 13.6* 11.8*  NEUTROABS 10.9*  --   HGB 11.1* 9.5*  HCT 33.4* 28.5*  MCV 92.5 93.1  PLT 326 277   BMP &GFR Recent Labs  Lab 04/27/21 1833 04/28/21 0717  NA 135 134*  K 3.5 3.6  CL 102 104  CO2 22 23  GLUCOSE 114* 99  BUN 15 13  CREATININE 0.64 0.60  CALCIUM 9.6 8.7*   CrCl cannot be calculated (Unknown ideal weight.). Liver & Pancreas: Recent Labs  Lab 04/27/21 1833  AST 17  ALT 9  ALKPHOS 44  BILITOT 0.8  PROT 7.5  ALBUMIN 3.8   Recent Labs  Lab 04/27/21 1833  LIPASE 20   No results for input(s): AMMONIA in the last 168 hours. Diabetic: No results for input(s): HGBA1C in the last 72 hours. No results for input(s): GLUCAP in the last 168 hours. Cardiac Enzymes: No results for input(s): CKTOTAL, CKMB, CKMBINDEX, TROPONINI in the last 168 hours. No results for input(s): PROBNP in the last 8760 hours. Coagulation Profile: No results for input(s): INR, PROTIME in the last 168 hours. Thyroid Function Tests: No results for input(s): TSH, T4TOTAL, FREET4, T3FREE, THYROIDAB in the last 72 hours. Lipid Profile: No results for input(s): CHOL, HDL, LDLCALC, TRIG, CHOLHDL, LDLDIRECT in the last 72 hours. Anemia Panel: No results for input(s): VITAMINB12, FOLATE, FERRITIN, TIBC, IRON, RETICCTPCT in the last 72 hours. Urine analysis:    Component Value Date/Time   COLORURINE YELLOW 04/27/2021 1833   APPEARANCEUR CLOUDY (A) 04/27/2021 1833   LABSPEC 1.014 04/27/2021 1833   PHURINE 7.0 04/27/2021 1833   GLUCOSEU NEGATIVE 04/27/2021 1833   HGBUR LARGE (A) 04/27/2021 1833   BILIRUBINUR NEGATIVE 04/27/2021 1833   BILIRUBINUR negative 03/08/2021 1135   BILIRUBINUR NEGATIVE 08/03/2013 1515   KETONESUR NEGATIVE  04/27/2021 1833   PROTEINUR 100 (A) 04/27/2021 1833   UROBILINOGEN 2.0 (A) 03/08/2021 1135   UROBILINOGEN 2.0 (H) 08/22/2013 2004   NITRITE NEGATIVE 04/27/2021 1833   LEUKOCYTESUR LARGE (A) 04/27/2021 1833   Sepsis Labs: Invalid input(s): PROCALCITONIN, LACTICIDVEN  Microbiology: Recent Results (from the past 240 hour(s))  Resp Panel by RT-PCR (Flu A&B, Covid) Nasopharyngeal Swab     Status: None   Collection Time: 04/27/21  6:34 PM   Specimen: Nasopharyngeal Swab; Nasopharyngeal(NP) swabs in vial transport medium  Result Value Ref Range Status   SARS Coronavirus 2 by RT PCR NEGATIVE NEGATIVE Final    Comment: (NOTE) SARS-CoV-2 target nucleic acids are NOT DETECTED.  The SARS-CoV-2 RNA is generally detectable in upper respiratory specimens during the acute phase of infection. The lowest concentration of SARS-CoV-2 viral copies this assay can detect is 138 copies/mL. A negative result  does not preclude SARS-Cov-2 infection and should not be used as the sole basis for treatment or other patient management decisions. A negative result may occur with  improper specimen collection/handling, submission of specimen other than nasopharyngeal swab, presence of viral mutation(s) within the areas targeted by this assay, and inadequate number of viral copies(<138 copies/mL). A negative result must be combined with clinical observations, patient history, and epidemiological information. The expected result is Negative.  Fact Sheet for Patients:  BloggerCourse.com  Fact Sheet for Healthcare Providers:  SeriousBroker.it  This test is no t yet approved or cleared by the [place] FDA and  has been authorized for detection and/or diagnosis of SARS-CoV-2 by FDA under an Emergency Use Authorization (EUA). This EUA will remain  in effect (meaning this test can be used) for the duration of the COVID-19 declaration under Section 564(b)(1) of  the Act, 21 U.S.C.section 360bbb-3(b)(1), unless the authorization is terminated  or revoked sooner.       Influenza A by PCR NEGATIVE NEGATIVE Final   Influenza B by PCR NEGATIVE NEGATIVE Final    Comment: (NOTE) The Xpert Xpress SARS-CoV-2/FLU/RSV plus assay is intended as an aid in the diagnosis of influenza from Nasopharyngeal swab specimens and should not be used as a sole basis for treatment. Nasal washings and aspirates are unacceptable for Xpert Xpress SARS-CoV-2/FLU/RSV testing.  Fact Sheet for Patients: BloggerCourse.com  Fact Sheet for Healthcare Providers: SeriousBroker.it  This test is not yet approved or cleared by the [place] FDA and has been authorized for detection and/or diagnosis of SARS-CoV-2 by FDA under an Emergency Use Authorization (EUA). This EUA will remain in effect (meaning this test can be used) for the duration of the COVID-19 declaration under Section 564(b)(1) of the Act, 21 U.S.C. section 360bbb-3(b)(1), unless the authorization is terminated or revoked.  Performed at [facility], [address] [place], [place] [address]     Radiology Studies: CT Renal Stone Study  Result Date: 04/27/2021 CLINICAL DATA:  Flank pain.  Concern for kidney stone. EXAM: CT ABDOMEN AND PELVIS WITHOUT CONTRAST TECHNIQUE: Multidetector CT imaging of the abdomen and pelvis was performed following the standard protocol without IV contrast. COMPARISON:  CT abdomen pelvis dated 03/01/2021. FINDINGS: Evaluation of this exam is limited in the absence of intravenous contrast. Lower chest: The visualized lung bases are clear. No intra-abdominal free air or free fluid. Hepatobiliary: Multiple small liver cysts and subcentimeter hypodensities which are not characterized on this CT. No intrahepatic biliary dilatation. The gallbladder is unremarkable. Pancreas: Unremarkable. No pancreatic ductal dilatation or surrounding  inflammatory changes. Spleen: Normal in size without focal abnormality. Adrenals/Urinary Tract: The adrenal glands unremarkable. Bilateral renal cysts in keeping with provided history of polycystic kidney disease. There has been interval placement of a right ureteral stent with proximal tip in the mid to upper pole collecting system and distal end within the urinary bladder. There is a 6 mm nonobstructing right renal upper pole calculus there is a 3 mm stone along the distal stent within the urinary bladder, likely corresponding to the stone seen on the prior CT at the right ureterovesical junction. Several additional faint nonobstructing right renal calculi as well as a 3 mm nonobstructing stone in the inferior pole of the left kidney. There is no hydronephrosis on the left. The urinary bladder is unremarkable. Stomach/Bowel: There is no bowel obstruction or active inflammation. The appendix is normal. Vascular/Lymphatic: The abdominal aorta and IVC unremarkable. No portal venous gas. There is no  adenopathy. Reproductive: The uterus is anteverted and grossly unremarkable. No adnexal masses. Other: None Musculoskeletal: No acute or significant osseous findings. IMPRESSION: 1. Interval placement of a right ureteral stent. A 3 mm stone along the distal stent within the urinary bladder likely corresponding to the right UVJ stone seen on the prior CT. Additional nonobstructing bilateral renal calculi. No hydronephrosis on the left. 2. Bilateral renal cysts in keeping with provided history of polycystic kidney disease. 3. No bowel obstruction. Normal appendix. Electronically Signed   By: Elgie Collard M.D.   On: 04/27/2021 22:19       Jaylise Peek T. Areon Cocuzza [facility]  If 7PM-7AM, please contact night-coverage [email or web address] 04/28/2021, 2:33 PM

## 2021-04-29 DIAGNOSIS — Z23 Encounter for immunization: Secondary | ICD-10-CM

## 2021-04-29 DIAGNOSIS — E876 Hypokalemia: Secondary | ICD-10-CM

## 2021-04-29 LAB — CBC
HCT: 27.7 % — ABNORMAL LOW (ref 36.0–46.0)
Hemoglobin: 9.1 g/dL — ABNORMAL LOW (ref 12.0–15.0)
MCH: 31.1 pg (ref 26.0–34.0)
MCHC: 32.9 g/dL (ref 30.0–36.0)
MCV: 94.5 fL (ref 80.0–100.0)
Platelets: 274 10*3/uL (ref 150–400)
RBC: 2.93 MIL/uL — ABNORMAL LOW (ref 3.87–5.11)
RDW: 12.8 % (ref 11.5–15.5)
WBC: 9.5 10*3/uL (ref 4.0–10.5)
nRBC: 0 % (ref 0.0–0.2)

## 2021-04-29 LAB — RENAL FUNCTION PANEL
Albumin: 2.9 g/dL — ABNORMAL LOW (ref 3.5–5.0)
Anion gap: 9 (ref 5–15)
BUN: 12 mg/dL (ref 6–20)
CO2: 23 mmol/L (ref 22–32)
Calcium: 8.8 mg/dL — ABNORMAL LOW (ref 8.9–10.3)
Chloride: 103 mmol/L (ref 98–111)
Creatinine, Ser: 0.62 mg/dL (ref 0.44–1.00)
GFR, Estimated: >60 mL/min (ref >60)
Glucose, Bld: 96 mg/dL (ref 70–99)
Phosphorus: 2.7 mg/dL (ref 2.5–4.6)
Potassium: 3.6 mmol/L (ref 3.5–5.1)
Sodium: 135 mmol/L (ref 135–145)

## 2021-04-29 LAB — URINE CULTURE

## 2021-04-29 LAB — POCT PREGNANCY, URINE: Preg Test, Ur: NEGATIVE

## 2021-04-29 LAB — MAGNESIUM: Magnesium: 1.5 mg/dL — ABNORMAL LOW (ref 1.7–2.4)

## 2021-04-29 MED ORDER — POTASSIUM CHLORIDE CRYS ER 20 MEQ PO TBCR
40.0000 meq | EXTENDED_RELEASE_TABLET | Freq: Once | ORAL | Status: AC
  Administered 2021-04-29: 40 meq via ORAL
  Filled 2021-04-29: qty 2

## 2021-04-29 MED ORDER — MAGNESIUM SULFATE 2 GM/50ML IV SOLN
2.0000 g | Freq: Once | INTRAVENOUS | Status: AC
  Administered 2021-04-29: 2 g via INTRAVENOUS
  Filled 2021-04-29: qty 50

## 2021-04-29 MED ORDER — COVID-19 MRNA VACC (MODERNA) 100 MCG/0.5ML IM SUSP
0.5000 mL | Freq: Once | INTRAMUSCULAR | Status: AC
  Administered 2021-04-29: 0.5 mL via INTRAMUSCULAR
  Filled 2021-04-29 (×2): qty 0.5

## 2021-04-29 NOTE — Progress Notes (Signed)
PROGRESS NOTE  DORANNE SCHMUTZ YWV:[id number] DOB: [birth year]   PCP: Orion Crook I, NP  Patient is from: Home  DOA: 04/27/2021 LOS: 2  Chief complaints:  Dysuria, nausea, vomiting, fever, right flank pain  Brief Narrative / Interim history: 30 year old F with PMH of right pyelonephritis in the setting of right renal stone s/p ureteral stent in 02/2021 and polycystic kidney disease presenting with dysuria, nausea, vomiting, fever, right flank and RLQ pain, and admitted for acute right pyelonephritis.  Cultures obtained.  She was a started on IV ceftriaxone.  Initial urine culture with multiple species.  Repeat urine culture pending.  Of note, patient did not follow-up with urology for definitive treatment of her ureteral stone after stent placement due to lack of insurance.   Subjective: Seen and examined earlier this morning.  No major events overnight of this morning.  She says she could not sleep well due to pain in her right back and right flank.  However, she reports improvement in her pain and dysuria.  She reports nausea and poor appetite.  She denies emesis.  She says she is unvaccinated for COVID-19, and willing to have the vaccination while here.  Objective: Vitals:   04/28/21 1957 04/28/21 2020 04/29/21 0013 04/29/21 0424  BP:  111/71  106/65  Pulse:  87  81  Resp:  18  18  Temp: 99.4 F (37.4 C) 99.1 F (37.3 C)    TempSrc: Oral Oral    SpO2:  99%  99%  Weight:  60.7 kg 60.7 kg   Height:   5\' 8"  (1.727 m)     Intake/Output Summary (Last 24 hours) at 04/29/2021 1402 Last data filed at 04/29/2021 1359 Gross per 24 hour  Intake 3148.31 ml  Output --  Net 3148.31 ml   Filed Weights   04/28/21 2020 04/29/21 0013  Weight: 60.7 kg 60.7 kg    Examination:  GENERAL: No apparent distress.  Nontoxic. HEENT: MMM.  Vision and hearing grossly intact.  NECK: Supple.  No apparent JVD.  RESP: 99% on RA.  No IWOB.  Fair aeration bilaterally. CVS:  RRR. Heart sounds  normal.  ABD/GI/GU: BS+. Abd soft, NTND.  MSK/EXT:  Moves extremities.  RLQ tenderness.  Right CVA tenderness. SKIN: no apparent skin lesion or wound NEURO: Awake and alert. Oriented appropriately.  No apparent focal neuro deficit. PSYCH: Calm. Normal affect.   Procedures:  None  Microbiology summarized: COVID-19 and influenza PCR nonreactive. Initial urine culture with multiple species. Repeat urine culture pending.  Assessment & Plan: Acute right pyelonephritis/complicated UTI and patient with ureteral stent about 2 months ago: Previous urine culture with pansensitive E. coli.  Urine culture this admission with multiple species.  Leukocytosis resolving. -Continue IV ceftriaxone -Repeat urine culture -Tylenol as needed for mild pain -Toradol 15 mg as needed for moderate pain -IV Dilaudid 0.5 mg every 3 hours as needed for severe pain -Appreciate input by urology-plan for outpatient follow-up  Nausea and vomiting: Emesis resolved.  Still with nausea and poor appetite. -Treat pyelonephritis as above -IV fluid and antiemetics  Hypokalemia/hypomagnesemia -Replenish and recheck.  COVID-19 vaccination-patient is unvaccinated but willing to have one while here. -Ordered Moderna  Body mass index is 20.35 kg/m.         DVT prophylaxis:  Place and maintain sequential compression device Start: 04/28/21 1432  Code Status: Full code Family Communication: Patient and/or RN. Available if any question.  Level of care: Med-Surg Status is: Inpatient  Remains inpatient appropriate because: Need  for IV antibiotics and IV pain medication for acute pyelonephritis and related pain.  Also need IV fluids for intractable nausea and vomiting       Consultants:  Urology   Sch Meds:  Scheduled Meds:  [START ON 04/30/2021] COVID-19 mRNA vaccine (Moderna)  0.5 mL Intramuscular ONCE-1600   Continuous Infusions:  cefTRIAXone (ROCEPHIN)  IV Stopped (04/28/21 1417)   lactated ringers  100 mL/hr at 04/29/21 1359   PRN Meds:.acetaminophen **OR** acetaminophen, HYDROmorphone (DILAUDID) injection, ketorolac, ondansetron **OR** ondansetron (ZOFRAN) IV, senna-docusate  Antimicrobials: Anti-infectives (From admission, onward)    Start     Dose/Rate Route Frequency Ordered Stop   04/28/21 1400  cefTRIAXone (ROCEPHIN) 2 g in sodium chloride 0.9 % 100 mL IVPB        2 g 200 mL/hr over 30 Minutes Intravenous Every 24 hours 04/27/21 2335     04/28/21 1000  cefTRIAXone (ROCEPHIN) 1 g in sodium chloride 0.9 % 100 mL IVPB  Status:  Discontinued        1 g 200 mL/hr over 30 Minutes Intravenous Every 24 hours 04/27/21 2333 04/27/21 2335   04/27/21 2030  cefTRIAXone (ROCEPHIN) 1 g in sodium chloride 0.9 % 100 mL IVPB        1 g 200 mL/hr over 30 Minutes Intravenous  Once 04/27/21 2021 04/27/21 2156        I have personally reviewed the following labs and images: CBC: Recent Labs  Lab 04/27/21 1833 04/28/21 0717 04/29/21 0436  WBC 13.6* 11.8* 9.5  NEUTROABS 10.9*  --   --   HGB 11.1* 9.5* 9.1*  HCT 33.4* 28.5* 27.7*  MCV 92.5 93.1 94.5  PLT 326 277 274   BMP &GFR Recent Labs  Lab 04/27/21 1833 04/28/21 0717 04/29/21 0436  NA 135 134* 135  K 3.5 3.6 3.6  CL 102 104 103  CO2 22 23 23   GLUCOSE 114* 99 96  BUN 15 13 12   CREATININE 0.64 0.60 0.62  CALCIUM 9.6 8.7* 8.8*  MG  --   --  1.5*  PHOS  --   --  2.7   Estimated Creatinine Clearance: 99.4 mL/min (by C-G formula based on SCr of 0.62 mg/dL). Liver & Pancreas: Recent Labs  Lab 04/27/21 1833 04/29/21 0436  AST 17  --   ALT 9  --   ALKPHOS 44  --   BILITOT 0.8  --   PROT 7.5  --   ALBUMIN 3.8 2.9*   Recent Labs  Lab 04/27/21 1833  LIPASE 20   No results for input(s): AMMONIA in the last 168 hours. Diabetic: No results for input(s): HGBA1C in the last 72 hours. No results for input(s): GLUCAP in the last 168 hours. Cardiac Enzymes: No results for input(s): CKTOTAL, CKMB, CKMBINDEX, TROPONINI in  the last 168 hours. No results for input(s): PROBNP in the last 8760 hours. Coagulation Profile: No results for input(s): INR, PROTIME in the last 168 hours. Thyroid Function Tests: No results for input(s): TSH, T4TOTAL, FREET4, T3FREE, THYROIDAB in the last 72 hours. Lipid Profile: No results for input(s): CHOL, HDL, LDLCALC, TRIG, CHOLHDL, LDLDIRECT in the last 72 hours. Anemia Panel: No results for input(s): VITAMINB12, FOLATE, FERRITIN, TIBC, IRON, RETICCTPCT in the last 72 hours. Urine analysis:    Component Value Date/Time   COLORURINE YELLOW 04/27/2021 1833   APPEARANCEUR CLOUDY (A) 04/27/2021 1833   LABSPEC 1.014 04/27/2021 1833   PHURINE 7.0 04/27/2021 1833   GLUCOSEU NEGATIVE 04/27/2021 1833   HGBUR LARGE (  A) 04/27/2021 1833   BILIRUBINUR NEGATIVE 04/27/2021 1833   BILIRUBINUR negative 03/08/2021 1135   BILIRUBINUR NEGATIVE 08/03/2013 1515   KETONESUR NEGATIVE 04/27/2021 1833   PROTEINUR 100 (A) 04/27/2021 1833   UROBILINOGEN 2.0 (A) 03/08/2021 1135   UROBILINOGEN 2.0 (H) 08/22/2013 2004   NITRITE NEGATIVE 04/27/2021 1833   LEUKOCYTESUR LARGE (A) 04/27/2021 1833   Sepsis Labs: Invalid input(s): PROCALCITONIN, LACTICIDVEN  Microbiology: Recent Results (from the past 240 hour(s))  Resp Panel by RT-PCR (Flu A&B, Covid) Nasopharyngeal Swab     Status: None   Collection Time: 04/27/21  6:34 PM   Specimen: Nasopharyngeal Swab; Nasopharyngeal(NP) swabs in vial transport medium  Result Value Ref Range Status   SARS Coronavirus 2 by RT PCR NEGATIVE NEGATIVE Final    Comment: (NOTE) SARS-CoV-2 target nucleic acids are NOT DETECTED.  The SARS-CoV-2 RNA is generally detectable in upper respiratory specimens during the acute phase of infection. The lowest concentration of SARS-CoV-2 viral copies this assay can detect is 138 copies/mL. A negative result does not preclude SARS-Cov-2 infection and should not be used as the sole basis for treatment or other patient management  decisions. A negative result may occur with  improper specimen collection/handling, submission of specimen other than nasopharyngeal swab, presence of viral mutation(s) within the areas targeted by this assay, and inadequate number of viral copies(<138 copies/mL). A negative result must be combined with clinical observations, patient history, and epidemiological information. The expected result is Negative.  Fact Sheet for Patients:  BloggerCourse.com  Fact Sheet for Healthcare Providers:  SeriousBroker.it  This test is no t yet approved or cleared by the [place] FDA and  has been authorized for detection and/or diagnosis of SARS-CoV-2 by FDA under an Emergency Use Authorization (EUA). This EUA will remain  in effect (meaning this test can be used) for the duration of the COVID-19 declaration under Section 564(b)(1) of the Act, 21 U.S.C.section 360bbb-3(b)(1), unless the authorization is terminated  or revoked sooner.       Influenza A by PCR NEGATIVE NEGATIVE Final   Influenza B by PCR NEGATIVE NEGATIVE Final    Comment: (NOTE) The Xpert Xpress SARS-CoV-2/FLU/RSV plus assay is intended as an aid in the diagnosis of influenza from Nasopharyngeal swab specimens and should not be used as a sole basis for treatment. Nasal washings and aspirates are unacceptable for Xpert Xpress SARS-CoV-2/FLU/RSV testing.  Fact Sheet for Patients: BloggerCourse.com  Fact Sheet for Healthcare Providers: SeriousBroker.it  This test is not yet approved or cleared by the [place] FDA and has been authorized for detection and/or diagnosis of SARS-CoV-2 by FDA under an Emergency Use Authorization (EUA). This EUA will remain in effect (meaning this test can be used) for the duration of the COVID-19 declaration under Section 564(b)(1) of the Act, 21 U.S.C. section 360bbb-3(b)(1), unless the  authorization is terminated or revoked.  Performed at [facility], [address], [place], [place] [address]   Urine Culture     Status: Abnormal   Collection Time: 04/28/21  3:53 AM   Specimen: Urine, Clean Catch  Result Value Ref Range Status   Specimen Description URINE, CLEAN CATCH  Final   Special Requests   Final    NONE Performed at [facility], [address] [place], [place] [address]    Culture MULTIPLE SPECIES PRESENT, SUGGEST RECOLLECTION (A)  Final   Report Status 04/29/2021 FINAL  Final    Radiology Studies: No results found.     Alwyn Ren  Ludwig Lean [facility]  If 7PM-7AM, please contact night-coverage [email or web address] 04/29/2021, 2:02 PM

## 2021-04-29 NOTE — TOC Initial Note (Signed)
Transition of Care [facility]) - Initial/Assessment Note    Patient Details  Name: Colleen Oneill MRN: [id number] Date of Birth: 1990-07-28  Transition of Care [facility]) CM/SW Contact:    Tom-Johnson, Hershal Coria, RN Phone Number: 04/29/2021, 3:06 PM  Clinical Narrative:                 CM spoke with patient at bedside about needs for post hospital transition. Presented with N/V, rt flank pain and fever. States she lives alone at [facility]. Parent's are living. Has four children who lives with their paternal grandmother. Has two brothers. Employed at [employer].  Has no PCP. List of providers given to patient at bedside. States she will look at them and chose. Uses Psychologist, forensic on [employer]. Does not have [employer]. CM reached out to Christia Reading, Artist about Medicaid and to verify information for [employer]. Shanda Bumps states they are working on it. MATCH done. Medical workup continues. CM will continue to follow with needs.      Barriers to Discharge: Continued Medical Work up   Patient Goals and CMS Choice Patient states their goals for this hospitalization and ongoing recovery are:: To go home CMS Medicare.gov Compare Post Acute Care list provided to:: Patient Choice offered to / list presented to : Patient  Expected Discharge Plan and Services     Discharge Planning Services: CM Consult   Living arrangements for the past 2 months: Hotel[facility]Motel [facility].)                                      Prior Living Arrangements/Services Living arrangements for the past 2 months: Hotel/Motel [facility].) Lives with:: Self Patient language and need for interpreter reviewed:: Yes Do you feel safe going back to the place where you live?: Yes      Need for Family Participation in Patient Care: Yes (Comment) Care giver support system in place?: Yes (comment)   Criminal Activity/Legal Involvement Pertinent to Current  Situation/Hospitalization: No - Comment as needed  Activities of Daily Living Home Assistive Devices/Equipment: None ADL Screening (condition at time of admission) Patient's cognitive ability adequate to safely complete daily activities?: Yes Is the patient deaf or have difficulty hearing?: No Does the patient have difficulty seeing, even when wearing glasses/contacts?: No Does the patient have difficulty concentrating, remembering, or making decisions?: No Patient able to express need for assistance with ADLs?: Yes Does the patient have difficulty dressing or bathing?: No Independently performs ADLs?: Yes (appropriate for developmental age) Does the patient have difficulty walking or climbing stairs?: No Weakness of Legs: None Weakness of Arms/Hands: None  Permission Sought/Granted Permission sought to share information with : Case Manager, Family Supports Permission granted to share information with : Yes, Verbal Permission Granted              Emotional Assessment Appearance:: Appears stated age Attitude/Demeanor/Rapport: Engaged Affect (typically observed): Accepting, Appropriate, Calm, Hopeful Orientation: : Oriented to Self, Oriented to Place, Oriented to  Time, Oriented to Situation Alcohol / Substance Use: Not Applicable Psych Involvement: No (comment)  Admission diagnosis:  Pyelonephritis [N12] Acute pyelonephritis [N10] Patient Active Problem List   Diagnosis Date Noted   Nausea and vomiting 04/27/2021   Pyelonephritis 03/02/2021   Sepsis (HCC) 03/01/2021   Acute pyelonephritis 03/01/2021   Polycystic kidney disease 03/01/2021   Anemia, chronic disease 03/01/2021   Premature uterine contractions  08/23/2013   Fetal heart rate decelerations, delivered 08/23/2013   Nausea and vomiting in pregnancy prior to [redacted] weeks gestation 03/15/2013   PCP:  Kathrynn Speed, NP Pharmacy:   [facility] [address] ([address]), [place] - [address] [place] ([place]) [place] [address] Phone: [phone] Fax: [phone]  [facility] [address] [place] [address] Phone: [phone] Fax: [phone]     Social Determinants of Health (SDOH) Interventions    Readmission Risk Interventions No flowsheet data found.

## 2021-04-29 NOTE — Plan of Care (Signed)
  Problem: Education: Goal: Knowledge of General Education information will improve Description: Including pain rating scale, medication(s)/side effects and non-pharmacologic comfort measures Outcome: Progressing   Problem: Clinical Measurements: Goal: Will remain free from infection Outcome: Progressing   Problem: Nutrition: Goal: Adequate nutrition will be maintained Outcome: Progressing   Problem: Coping: Goal: Level of anxiety will decrease Outcome: Progressing   Problem: Elimination: Goal: Will not experience complications related to urinary retention Outcome: Progressing   

## 2021-04-30 LAB — CBC
HCT: 27 % — ABNORMAL LOW (ref 36.0–46.0)
Hemoglobin: 8.8 g/dL — ABNORMAL LOW (ref 12.0–15.0)
MCH: 30.8 pg (ref 26.0–34.0)
MCHC: 32.6 g/dL (ref 30.0–36.0)
MCV: 94.4 fL (ref 80.0–100.0)
Platelets: 283 10*3/uL (ref 150–400)
RBC: 2.86 MIL/uL — ABNORMAL LOW (ref 3.87–5.11)
RDW: 12.6 % (ref 11.5–15.5)
WBC: 7.8 10*3/uL (ref 4.0–10.5)
nRBC: 0 % (ref 0.0–0.2)

## 2021-04-30 LAB — RENAL FUNCTION PANEL
Albumin: 2.7 g/dL — ABNORMAL LOW (ref 3.5–5.0)
Anion gap: 8 (ref 5–15)
BUN: 12 mg/dL (ref 6–20)
CO2: 24 mmol/L (ref 22–32)
Calcium: 8.5 mg/dL — ABNORMAL LOW (ref 8.9–10.3)
Chloride: 103 mmol/L (ref 98–111)
Creatinine, Ser: 0.54 mg/dL (ref 0.44–1.00)
GFR, Estimated: >60 mL/min (ref >60)
Glucose, Bld: 91 mg/dL (ref 70–99)
Phosphorus: 3 mg/dL (ref 2.5–4.6)
Potassium: 3.6 mmol/L (ref 3.5–5.1)
Sodium: 135 mmol/L (ref 135–145)

## 2021-04-30 LAB — MAGNESIUM: Magnesium: 1.7 mg/dL (ref 1.7–2.4)

## 2021-04-30 LAB — URINE CULTURE

## 2021-04-30 NOTE — Progress Notes (Signed)
PROGRESS NOTE  Colleen Oneill HFW:[id number] DOB: July 21, 1990 DOA: 04/27/2021 PCP: Orion Crook I, NP  Brief History   The patient is a 30 yr old woman with a medical history of right pyelonephritis in the setting of right renal stone s/p ureteral stent in 02/2021 and polycystic kidney disease presenting with dysuria, nausea, vomiting, fever, right flank and RLQ pain, and admitted for acute right pyelonephritis.  Cultures obtained.  She was a started on IV ceftriaxone.  Initial urine culture with multiple species.  Repeat urine culture pending.   Of note, patient did not follow-up with urology for definitive treatment of her ureteral stone after stent placement due to lack of insurance.   She has been admitted to a med/surg bed. Urology was consulted. The patient does not require surgical intervention at this time, but the patient will need to follow up with urology as outpatient.  Consultants  Urology  Procedures  None  Antibiotics   Anti-infectives (From admission, onward)    Start     Dose/Rate Route Frequency Ordered Stop   04/28/21 1400  cefTRIAXone (ROCEPHIN) 2 g in sodium chloride 0.9 % 100 mL IVPB        2 g 200 mL/hr over 30 Minutes Intravenous Every 24 hours 04/27/21 2335     04/28/21 1000  cefTRIAXone (ROCEPHIN) 1 g in sodium chloride 0.9 % 100 mL IVPB  Status:  Discontinued        1 g 200 mL/hr over 30 Minutes Intravenous Every 24 hours 04/27/21 2333 04/27/21 2335   04/27/21 2030  cefTRIAXone (ROCEPHIN) 1 g in sodium chloride 0.9 % 100 mL IVPB        1 g 200 mL/hr over 30 Minutes Intravenous  Once 04/27/21 2021 04/27/21 2156        Subjective  The patient continues to complain of severe right flank and abdominal pain as well as dysuria. No new complaints.  Objective   Vitals:  Vitals:   04/30/21 0852 04/30/21 1556  BP: (!) 104/58 110/75  Pulse: (!) 56 76  Resp: 17 17  Temp: 98.7 F (37.1 C) 99.6 F (37.6 C)  SpO2: 100% 99%    Exam:  Constitutional:   The patient is awake and alert. She is in mild distress from flank pain. Respiratory:  No increased work of breathing. No rales, rhonchi, or wheezes.normal. No tactile fremitus Cardiovascular:  RRR, no m/r/g No LE extremity edema   Normal pedal pulses Abdomen:  Abdomen appears normal; no tenderness or masses Positive for right flank pain No hernias No HSM Musculoskeletal:  Digits/nails BUE: no clubbing, cyanosis, petechiae, infection Skin:  No rashes, lesions, ulcers palpation of skin: no induration or nodules Neurologic:  CN 2-12 intact Sensation all 4 extremities intact Psychiatric:  Mental status Mood, affect appropriate Orientation to person, place, time  judgment and insight appear intact    I have personally reviewed the following:   Today's Data  Vitals  Lab Data  CBC BMP  Micro Data  Urine culture: polymicrobial  Scheduled Meds: Continuous Infusions:  cefTRIAXone (ROCEPHIN)  IV 2 g (04/30/21 1422)   lactated ringers 100 mL/hr at 04/30/21 1418    Principal Problem:   Pyelonephritis Active Problems:   Nausea and vomiting   A & P  Acute right pyelonephritis/complicated UTI and patient with ureteral stent about 2 months ago: Previous urine culture with pansensitive E. coli.  Urine culture this admission with multiple species.  Leukocytosis resolving. -Continue IV ceftriaxone -Repeat urine culture -Tylenol as  needed for mild pain -Toradol 15 mg as needed for moderate pain -IV Dilaudid 0.5 mg every 3 hours as needed for severe pain -Appreciate input by urology-plan for outpatient follow-up   Nausea and vomiting: Emesis resolved.  Still with nausea and poor appetite. -Treat pyelonephritis as above -IV fluid and antiemetics   Hypokalemia/hypomagnesemia -Replenish and recheck.  I have seen and examined this patient myself. I have spent34 minutes in his evaluation and care.   COVID-19 vaccination-patient is unvaccinated but willing to have one  while here. -Ordered Moderna   Body mass index is 20.35 kg/m. DVT prophylaxis:  Place and maintain sequential compression device Start: 04/28/21 1432   Code Status: Full code Family Communication: Patient and/or RN. Available if any question.  Level of care: Med-Surg Status is: Inpatient   Remains inpatient appropriate because: Need for IV antibiotics and IV pain medication for acute pyelonephritis and related pain.  Also need IV fluids for intractable nausea and vomiting    Lynelle Weiler, DO [facility] Direct contact: see [email or web address]  7PM-7AM contact night coverage as above 04/30/2021, 6:37 PM  LOS: 3 days    LOS: 3 days

## 2021-05-01 MED ORDER — HYDROCODONE-ACETAMINOPHEN 5-325 MG PO TABS
1.0000 | ORAL_TABLET | Freq: Four times a day (QID) | ORAL | Status: DC | PRN
Start: 2021-05-01 — End: 2021-05-02
  Administered 2021-05-01: 1 via ORAL
  Filled 2021-05-01: qty 1

## 2021-05-01 NOTE — Progress Notes (Signed)
PROGRESS NOTE  Colleen Oneill BZJ:[id number] DOB: 1990-09-15 DOA: 04/27/2021 PCP: Orion Crook I, NP  Brief History   The patient is a 30 yr old woman with a medical history of right pyelonephritis in the setting of right renal stone s/p ureteral stent in 02/2021 and polycystic kidney disease presenting with dysuria, nausea, vomiting, fever, right flank and RLQ pain, and admitted for acute right pyelonephritis.  Cultures obtained.  She was a started on IV ceftriaxone.  Initial urine culture with multiple species.  Repeat urine culture pending.   Of note, patient did not follow-up with urology for definitive treatment of her ureteral stone after stent placement due to lack of insurance.   She has been admitted to a med/surg bed. Urology was consulted. The patient does not require surgical intervention at this time, but the patient will need to follow up with urology as outpatient.  Consultants  Urology  Procedures  None  Antibiotics   Anti-infectives (From admission, onward)    Start     Dose/Rate Route Frequency Ordered Stop   04/28/21 1400  cefTRIAXone (ROCEPHIN) 2 g in sodium chloride 0.9 % 100 mL IVPB        2 g 200 mL/hr over 30 Minutes Intravenous Every 24 hours 04/27/21 2335     04/28/21 1000  cefTRIAXone (ROCEPHIN) 1 g in sodium chloride 0.9 % 100 mL IVPB  Status:  Discontinued        1 g 200 mL/hr over 30 Minutes Intravenous Every 24 hours 04/27/21 2333 04/27/21 2335   04/27/21 2030  cefTRIAXone (ROCEPHIN) 1 g in sodium chloride 0.9 % 100 mL IVPB        1 g 200 mL/hr over 30 Minutes Intravenous  Once 04/27/21 2021 04/27/21 2156        Subjective  The patient states that her pain is improved. No new complaints.   Objective   Vitals:  Vitals:   05/01/21 0556 05/01/21 0928  BP: (!) 149/78 108/69  Pulse: 72 80  Resp: 16 20  Temp: 98.5 F (36.9 C) 98.8 F (37.1 C)  SpO2: 100% 100%    Exam:  Constitutional:  The patient is awake and alert. No acute  distress. Respiratory:  No increased work of breathing. No rales, rhonchi, or wheezes.normal. No tactile fremitus Cardiovascular:  RRR, no m/r/g No LE extremity edema   Normal pedal pulses Abdomen:  Abdomen appears normal; no tenderness or masses Right flank pain had resolved. No hernias No HSM Musculoskeletal:  Digits/nails BUE: no clubbing, cyanosis, petechiae, infection Skin:  No rashes, lesions, ulcers palpation of skin: no induration or nodules Neurologic:  CN 2-12 intact Sensation all 4 extremities intact Psychiatric:  Mental status Mood, affect appropriate Orientation to person, place, time  judgment and insight appear intact    I have personally reviewed the following:   Today's Data  Vitals  Lab Data    Micro Data  Urine culture: polymicrobial, repeated. Still polymicrobial.  Scheduled Meds: Continuous Infusions:  cefTRIAXone (ROCEPHIN)  IV 2 g (05/01/21 1405)   lactated ringers 100 mL/hr at 05/01/21 1100    Principal Problem:   Pyelonephritis Active Problems:   Nausea and vomiting   A & P  Acute right pyelonephritis/complicated UTI and patient with ureteral stent about 2 months ago: Previous urine culture with pansensitive E. coli.  Urine culture this admission with multiple species.  Leukocytosis resolving. -Continue IV ceftriaxone -Repeat urine culture also with polymicrobial growth. -Tylenol as needed for mild pain -Toradol 15 mg  as needed for moderate pain -Dilaudid has been discontinued. Hydrocodone made available -Appreciate input by urology-plan for outpatient follow-up   Nausea and vomiting: Emesis resolved.  Still with nausea and poor appetite. -Treat pyelonephritis as above -IV fluid and antiemetics   Hypokalemia/hypomagnesemia -Supplement and monitor  I have seen and examined this patient myself. I have spent 35 minutes in his evaluation and care.   COVID-19 vaccination-patient is unvaccinated but willing to have one while  here. -Ordered Moderna   Body mass index is 20.35 kg/m. DVT prophylaxis:  Place and maintain sequential compression device Start: 04/28/21 1432   Code Status: Full code Family Communication: Patient and/or RN. Available if any question.  Level of care: Med-Surg Status is: Inpatient   Remains inpatient appropriate because: Need for IV antibiotics and IV pain medication for acute pyelonephritis and related pain.  Also need IV fluids for intractable nausea and vomiting    Rueben Kassim, DO [facility] Direct contact: see [email or web address]  7PM-7AM contact night coverage as above 05/01/2021, 3:06 PM  LOS: 3 days    LOS: 4 days

## 2021-05-02 ENCOUNTER — Other Ambulatory Visit (HOSPITAL_COMMUNITY): Payer: Self-pay

## 2021-05-02 LAB — COMPREHENSIVE METABOLIC PANEL
ALT: 12 U/L (ref 0–44)
AST: 11 U/L — ABNORMAL LOW (ref 15–41)
Albumin: 2.8 g/dL — ABNORMAL LOW (ref 3.5–5.0)
Alkaline Phosphatase: 33 U/L — ABNORMAL LOW (ref 38–126)
Anion gap: 6 (ref 5–15)
BUN: 8 mg/dL (ref 6–20)
CO2: 26 mmol/L (ref 22–32)
Calcium: 8.7 mg/dL — ABNORMAL LOW (ref 8.9–10.3)
Chloride: 104 mmol/L (ref 98–111)
Creatinine, Ser: 0.59 mg/dL (ref 0.44–1.00)
GFR, Estimated: >60 mL/min (ref >60)
Glucose, Bld: 87 mg/dL (ref 70–99)
Potassium: 3.7 mmol/L (ref 3.5–5.1)
Sodium: 136 mmol/L (ref 135–145)
Total Bilirubin: 0.4 mg/dL (ref 0.3–1.2)
Total Protein: 5.9 g/dL — ABNORMAL LOW (ref 6.5–8.1)

## 2021-05-02 LAB — CBC WITH DIFFERENTIAL/PLATELET
Abs Immature Granulocytes: 0.02 10*3/uL (ref 0.00–0.07)
Basophils Absolute: 0 10*3/uL (ref 0.0–0.1)
Basophils Relative: 1 %
Eosinophils Absolute: 0.1 10*3/uL (ref 0.0–0.5)
Eosinophils Relative: 3 %
HCT: 27.1 % — ABNORMAL LOW (ref 36.0–46.0)
Hemoglobin: 8.7 g/dL — ABNORMAL LOW (ref 12.0–15.0)
Immature Granulocytes: 0 %
Lymphocytes Relative: 31 %
Lymphs Abs: 1.6 10*3/uL (ref 0.7–4.0)
MCH: 30.4 pg (ref 26.0–34.0)
MCHC: 32.1 g/dL (ref 30.0–36.0)
MCV: 94.8 fL (ref 80.0–100.0)
Monocytes Absolute: 0.5 10*3/uL (ref 0.1–1.0)
Monocytes Relative: 10 %
Neutro Abs: 2.9 10*3/uL (ref 1.7–7.7)
Neutrophils Relative %: 55 %
Platelets: 305 10*3/uL (ref 150–400)
RBC: 2.86 MIL/uL — ABNORMAL LOW (ref 3.87–5.11)
RDW: 12.4 % (ref 11.5–15.5)
WBC: 5.3 10*3/uL (ref 4.0–10.5)
nRBC: 0 % (ref 0.0–0.2)

## 2021-05-02 MED ORDER — AMOXICILLIN-POT CLAVULANATE 875-125 MG PO TABS: 1.0000 | ORAL_TABLET | Freq: Two times a day (BID) | ORAL | 0 refills | Status: DC

## 2021-05-02 MED ORDER — HYDROCODONE-ACETAMINOPHEN 5-325 MG PO TABS: 1.0000 | ORAL_TABLET | Freq: Four times a day (QID) | ORAL | 0 refills | Status: DC | PRN

## 2021-05-02 MED ORDER — AMOXICILLIN-POT CLAVULANATE 875-125 MG PO TABS
1.0000 | ORAL_TABLET | Freq: Two times a day (BID) | ORAL | 0 refills | Status: AC
  Filled 2021-05-02: qty 16, 8d supply, fill #0

## 2021-05-02 MED ORDER — HYDROCODONE-ACETAMINOPHEN 5-325 MG PO TABS
1.0000 | ORAL_TABLET | Freq: Four times a day (QID) | ORAL | 0 refills | Status: DC | PRN
Start: 2021-05-02 — End: 2022-01-16
  Filled 2021-05-02: qty 30, 4d supply, fill #0

## 2021-05-02 NOTE — Progress Notes (Signed)
DISCHARGE NOTE HOME Colleen Oneill to be discharged Home per MD order. Discussed prescriptions and follow up appointments with the patient. Prescriptions given to patient; medication list explained in detail. Patient verbalized understanding.  Skin clean, dry and intact without evidence of skin break down, no evidence of skin tears noted. IV catheter discontinued intact. Site without signs and symptoms of complications. Dressing and pressure applied. Pt denies pain at the site currently. No complaints noted.  Patient free of lines, drains, and wounds.   An After Visit Summary (AVS) was printed and given to the patient. Patient escorted via wheelchair, and discharged home via private auto.  [name] S Avion Patella, RN

## 2021-05-02 NOTE — Discharge Summary (Signed)
Physician Discharge Summary  Colleen Oneill WPV:[id number] DOB: 30-Jul-1990 DOA: 04/27/2021  PCP: Bo Merino I, NP  Admit date: 04/27/2021 Discharge date: 05/02/2021  Recommendations for Outpatient Follow-up:  Discharge to home. Complete antibiotics. Follow up with Dr. Davis Gourd (Urology) as outpatient after antibiotics are completed.  Follow up with PCP in 7-10 days.   Follow-up Information     Aldine Contes, MD. Schedule an appointment as soon as possible for a visit in 2 week(s).   Specialty: Student Contact information: [place] [address] [place]. Go to.   Why: Follow up appointment on 06/10/21 at 0930 with Dr. Barbera Setters information: [place] [id number] [phone]                 Discharge Diagnoses: Principal diagnosis is #1 Pyelonephritis Complicated UTI S/P Placement of ureteral stent in 02/2021 for obstructing ureteral calculus Nausea/Vomiting  Hypokalemia/Hypomagnesemia   Discharge Condition: Fair Disposition: Home  Diet recommendation: Regular  Filed Weights   04/28/21 2020 04/29/21 0013  Weight: 60.7 kg 60.7 kg    History of present illness:  Colleen Oneill is a 30 y.o. female with medical history significant for renal stones and had stent placed in September during admission. Presents for evaluation of 2 day history of urinary sick, dysuria, nausea vomiting and fever.  She reports is is how she felt when she had the infected renal stone in the mid September.  At that time she was admitted and had an obstructing nephrolithiasis and a right lighted stent was placed by urology.  Patient has not followed up with urology and stent has not been removed.  She reports has been having a low-grade fever to 101 degrees at home.  She reports the pain is in the right lower abdomen in the suprapubic region and in her right flank.  It is  progressively worsened over the last 24 hours.  She reports pain is a 9 out of 10 at its worst.  She reports she completed all the antibiotics as prescribed and she was discharged in the hospital previously.  She has had no known sick contacts.  She denies any cough, congestion, upper respiratory symptoms, chest pain, palpitations, numbness or weakness of extremities.  She has not been to tolerate p.o. intake today secondary to nausea and vomiting.  She has not had any vomiting emergency room.  She reports that she last vomited around noon at home but has not had any p.o. intake since that time. She denies tobacco or alcohol use.   ED Course: Colleen Oneill is been hemodynamically stable in the emergency room.  CT scan shows right-sided stent and 3 mm stone with pyelonephritis on the right.  Lab work shows a BBC 13,600 hemoglobin 11.1 hematocrit 33.4 platelets 3 26,000 sodium 135 potassium 3.5 chloride 102 bicarb 22 creatinine 0.64 BUN 15 glucose 114 alk phos is 44 AST 17 ALT 9 lipase 20.  Patient is given IV fluids in the emergency room and started on Rocephin for antibiotic coverage.  Hospitalist service been asked admit for further management.  Hospital Course:  The patient is a 30 yr old woman with a medical history of right pyelonephritis in the setting of right renal stone s/p ureteral stent in 02/2021 and polycystic kidney disease presenting with dysuria, nausea, vomiting, fever, right flank and RLQ pain, and admitted for acute right pyelonephritis.  Cultures obtained.  She was a started on IV ceftriaxone.  Initial urine culture with multiple species.  Repeat urine culture pending.   Of note, patient did not follow-up with urology for definitive treatment of her ureteral stone after stent placement due to lack of insurance.    She has been admitted to a med/surg bed. Urology was consulted. The patient does not require surgical intervention at this time, but the patient will need to follow up with urology  as outpatient.  The patient will be discharged to home today in fair condition.  Today's assessment: S: The patient is resting comfortably. O: Vitals:  Vitals:   05/02/21 0534 05/02/21 0907  BP: 111/72 112/68  Pulse: 65 78  Resp: 17 17  Temp: 98.1 F (36.7 C) 98.2 F (36.8 C)  SpO2: 100% 100%    Exam:  Constitutional:  The patient is awake, alert, and oriented x 3. No acute distress. Respiratory:  No increased work of breathing. No wheezes, rales, or rhonchi No tactile fremitus Cardiovascular:  Regular rate and rhythm No murmurs, ectopy, or gallups. No lateral PMI. No thrills. Abdomen:  Abdomen is soft, non-tender, non-distended No hernias, masses, or organomegaly Normoactive bowel sounds.  Musculoskeletal:  No cyanosis, clubbing, or edema Skin:  No rashes, lesions, ulcers palpation of skin: no induration or nodules Neurologic:  CN 2-12 intact Sensation all 4 extremities intact Psychiatric:  Mental status Mood, affect appropriate Orientation to person, place, time  judgment and insight appear intact   Discharge Instructions  Discharge Instructions     Activity as tolerated - No restrictions   Complete by: As directed    Call MD for:  persistant nausea and vomiting   Complete by: As directed    Call MD for:  severe uncontrolled pain   Complete by: As directed    Diet general   Complete by: As directed    Discharge instructions   Complete by: As directed    Discharge to home. Complete antibiotics. Follow up with Dr. Davis Gourd (Urology) as outpatient after antibiotics are completed.  Follow up with PCP in 7-10 days.   Increase activity slowly   Complete by: As directed       Allergies as of 05/02/2021   No Known Allergies      Medication List     STOP taking these medications    meclizine 25 MG tablet Commonly known as: ANTIVERT   traMADol 50 MG tablet Commonly known as: ULTRAM       TAKE these medications    acetaminophen  325 MG tablet Commonly known as: TYLENOL Take 2 tablets (650 mg total) by mouth every 6 (six) hours as needed for mild pain or moderate pain.   amoxicillin-clavulanate 875-125 MG tablet Commonly known as: Augmentin Take 1 tablet by mouth 2 (two) times daily for 8 days.   HYDROcodone-acetaminophen 5-325 MG tablet Commonly known as: NORCO/VICODIN Take 1-2 tablets by mouth every 6 (six) hours as needed for moderate pain or severe pain.       No Known Allergies  The results of significant diagnostics from this hospitalization (including imaging, microbiology, ancillary and laboratory) are listed below for reference.    Significant Diagnostic Studies: CT Renal Stone Study  Result Date: 04/27/2021 CLINICAL DATA:  Flank pain.  Concern for kidney stone. EXAM: CT ABDOMEN AND PELVIS WITHOUT CONTRAST TECHNIQUE: Multidetector CT imaging of the abdomen and pelvis was performed following the standard protocol without IV contrast. COMPARISON:  CT abdomen pelvis dated 03/01/2021. FINDINGS: Evaluation of this  exam is limited in the absence of intravenous contrast. Lower chest: The visualized lung bases are clear. No intra-abdominal free air or free fluid. Hepatobiliary: Multiple small liver cysts and subcentimeter hypodensities which are not characterized on this CT. No intrahepatic biliary dilatation. The gallbladder is unremarkable. Pancreas: Unremarkable. No pancreatic ductal dilatation or surrounding inflammatory changes. Spleen: Normal in size without focal abnormality. Adrenals/Urinary Tract: The adrenal glands unremarkable. Bilateral renal cysts in keeping with provided history of polycystic kidney disease. There has been interval placement of a right ureteral stent with proximal tip in the mid to upper pole collecting system and distal end within the urinary bladder. There is a 6 mm nonobstructing right renal upper pole calculus there is a 3 mm stone along the distal stent within the urinary bladder,  likely corresponding to the stone seen on the prior CT at the right ureterovesical junction. Several additional faint nonobstructing right renal calculi as well as a 3 mm nonobstructing stone in the inferior pole of the left kidney. There is no hydronephrosis on the left. The urinary bladder is unremarkable. Stomach/Bowel: There is no bowel obstruction or active inflammation. The appendix is normal. Vascular/Lymphatic: The abdominal aorta and IVC unremarkable. No portal venous gas. There is no adenopathy. Reproductive: The uterus is anteverted and grossly unremarkable. No adnexal masses. Other: None Musculoskeletal: No acute or significant osseous findings. IMPRESSION: 1. Interval placement of a right ureteral stent. A 3 mm stone along the distal stent within the urinary bladder likely corresponding to the right UVJ stone seen on the prior CT. Additional nonobstructing bilateral renal calculi. No hydronephrosis on the left. 2. Bilateral renal cysts in keeping with provided history of polycystic kidney disease. 3. No bowel obstruction. Normal appendix. Electronically Signed   By: Anner Crete M.D.   On: 04/27/2021 22:19    Microbiology: Recent Results (from the past 240 hour(s))  Resp Panel by RT-PCR (Flu A&B, Covid) Nasopharyngeal Swab     Status: None   Collection Time: 04/27/21  6:34 PM   Specimen: Nasopharyngeal Swab; Nasopharyngeal(NP) swabs in vial transport medium  Result Value Ref Range Status   SARS Coronavirus 2 by RT PCR NEGATIVE NEGATIVE Final    Comment: (NOTE) SARS-CoV-2 target nucleic acids are NOT DETECTED.  The SARS-CoV-2 RNA is generally detectable in upper respiratory specimens during the acute phase of infection. The lowest concentration of SARS-CoV-2 viral copies this assay can detect is 138 copies/mL. A negative result does not preclude SARS-Cov-2 infection and should not be used as the sole basis for treatment or other patient management decisions. A negative result may  occur with  improper specimen collection/handling, submission of specimen other than nasopharyngeal swab, presence of viral mutation(s) within the areas targeted by this assay, and inadequate number of viral copies(<138 copies/mL). A negative result must be combined with clinical observations, patient history, and epidemiological information. The expected result is Negative.  Fact Sheet for Patients:  EntrepreneurPulse.com.au  Fact Sheet for Healthcare Providers:  IncredibleEmployment.be  This test is no t yet approved or cleared by the [place] FDA and  has been authorized for detection and/or diagnosis of SARS-CoV-2 by FDA under an Emergency Use Authorization (EUA). This EUA will remain  in effect (meaning this test can be used) for the duration of the COVID-19 declaration under Section 564(b)(1) of the Act, 21 U.S.C.section 360bbb-3(b)(1), unless the authorization is terminated  or revoked sooner.       Influenza A by PCR NEGATIVE NEGATIVE Final   Influenza B by  PCR NEGATIVE NEGATIVE Final    Comment: (NOTE) The Xpert Xpress SARS-CoV-2/FLU/RSV plus assay is intended as an aid in the diagnosis of influenza from Nasopharyngeal swab specimens and should not be used as a sole basis for treatment. Nasal washings and aspirates are unacceptable for Xpert Xpress SARS-CoV-2/FLU/RSV testing.  Fact Sheet for Patients: EntrepreneurPulse.com.au  Fact Sheet for Healthcare Providers: IncredibleEmployment.be  This test is not yet approved or cleared by the [place] FDA and has been authorized for detection and/or diagnosis of SARS-CoV-2 by FDA under an Emergency Use Authorization (EUA). This EUA will remain in effect (meaning this test can be used) for the duration of the COVID-19 declaration under Section 564(b)(1) of the Act, 21 U.S.C. section 360bbb-3(b)(1), unless the authorization is terminated  or revoked.  Performed at [facility], [place] [address] [place], [place] [address]   Urine Culture     Status: Abnormal   Collection Time: 04/28/21  3:53 AM   Specimen: Urine, Clean Catch  Result Value Ref Range Status   Specimen Description URINE, CLEAN CATCH  Final   Special Requests   Final    NONE Performed at [facility], [place] [address] [place], [place] [address]    Culture MULTIPLE SPECIES PRESENT, SUGGEST RECOLLECTION (A)  Final   Report Status 04/29/2021 FINAL  Final  Urine Culture     Status: Abnormal   Collection Time: 04/29/21  4:29 PM   Specimen: Urine, Clean Catch  Result Value Ref Range Status   Specimen Description URINE, CLEAN CATCH  Final   Special Requests   Final    NONE Performed at [facility], [place] [address], [place], [place] [address]    Culture MULTIPLE SPECIES PRESENT, SUGGEST RECOLLECTION (A)  Final   Report Status 04/30/2021 FINAL  Final     Labs: Basic Metabolic Panel: Recent Labs  Lab 04/27/21 1833 04/28/21 0717 04/29/21 0436 04/30/21 0503 05/02/21 0329  NA 135 134* 135 135 136  K 3.5 3.6 3.6 3.6 3.7  CL 102 104 103 103 104  CO2 22 23 23 24 26   GLUCOSE 114* 99 96 91 87  BUN 15 13 12 12 8   CREATININE 0.64 0.60 0.62 0.54 0.59  CALCIUM 9.6 8.7* 8.8* 8.5* 8.7*  MG  --   --  1.5* 1.7  --   PHOS  --   --  2.7 3.0  --    Liver Function Tests: Recent Labs  Lab 04/27/21 1833 04/29/21 0436 04/30/21 0503 05/02/21 0329  AST 17  --   --  11*  ALT 9  --   --  12  ALKPHOS 44  --   --  33*  BILITOT 0.8  --   --  0.4  PROT 7.5  --   --  5.9*  ALBUMIN 3.8 2.9* 2.7* 2.8*   Recent Labs  Lab 04/27/21 1833  LIPASE 20   No results for input(s): AMMONIA in the last 168 hours. CBC: Recent Labs  Lab 04/27/21 1833 04/28/21 0717 04/29/21 0436 04/30/21 0503 05/02/21 0329  WBC 13.6* 11.8* 9.5 7.8 5.3  NEUTROABS 10.9*  --   --   --  2.9  HGB 11.1* 9.5* 9.1* 8.8* 8.7*  HCT 33.4* 28.5* 27.7* 27.0* 27.1*  MCV 92.5 93.1  94.5 94.4 94.8  PLT 326 277 274 283 305   Cardiac Enzymes: No results for input(s): CKTOTAL, CKMB, CKMBINDEX, TROPONINI in the last 168 hours. BNP: BNP (last 3 results) No results  for input(s): BNP in the last 8760 hours.  ProBNP (last 3 results) No results for input(s): PROBNP in the last 8760 hours.  CBG: No results for input(s): GLUCAP in the last 168 hours.  Principal Problem:   Pyelonephritis Active Problems:   Nausea and vomiting   Time coordinating discharge: 38 minutes.  Signed:        Meghana Tullo, DO [facility]  05/02/2021, 4:58 PM d

## 2021-05-02 NOTE — Discharge Summary (Signed)
Physician Discharge Summary  Colleen Oneill YIR:[id number] DOB: [birth year] DOA: 04/27/2021  PCP: Bo Merino I, NP  Admit date: 04/27/2021 Discharge date: 05/02/2021  Recommendations for Outpatient Follow-up:  Discharge to home Follow up with urology in 2-4 weeks. Follow up with PCP in 7-10 days. Complete all antibiotics   Follow-up Information     Aldine Contes, MD. Schedule an appointment as soon as possible for a visit in 2 week(s).   Specialty: Student Contact information: [place] [phone]                Discharge Diagnoses: Principal diagnosis is #1 Acute right pyelonephritis Complicated UTI S/P ureteral stent placement on 03/02/2021 for infected obstructing stone. Still in place. Patient did not follow up with urology Nausea and vomiting Hypokalemia Hypomagnesemia  Discharge Condition: Fair  Disposition: Home  Diet recommendation: Regular  Filed Weights   04/28/21 2020 04/29/21 0013  Weight: 60.7 kg 60.7 kg    History of present illness:  Colleen Oneill is a 30 y.o. female with medical history significant for renal stones and had stent placed in September during admission. Presents for evaluation of 2 day history of urinary sick, dysuria, nausea vomiting and fever.  She reports is is how she felt when she had the infected renal stone in the mid September.  At that time she was admitted and had an obstructing nephrolithiasis and a right lighted stent was placed by urology.  Patient has not followed up with urology and stent has not been removed.  She reports has been having a low-grade fever to 101 degrees at home.  She reports the pain is in the right lower abdomen in the suprapubic region and in her right flank.  It is progressively worsened over the last 24 hours.  She reports pain is a 9 out of 10 at its worst.  She reports she completed all the antibiotics as prescribed and she was discharged in the hospital previously.  She  has had no known sick contacts.  She denies any cough, congestion, upper respiratory symptoms, chest pain, palpitations, numbness or weakness of extremities.  She has not been to tolerate p.o. intake today secondary to nausea and vomiting.  She has not had any vomiting emergency room.  She reports that she last vomited around noon at home but has not had any p.o. intake since that time. She denies tobacco or alcohol use.   ED Course: Colleen Oneill is been hemodynamically stable in the emergency room.  CT scan shows right-sided stent and 3 mm stone with pyelonephritis on the right.  Lab work shows a BBC 13,600 hemoglobin 11.1 hematocrit 33.4 platelets 3 26,000 sodium 135 potassium 3.5 chloride 102 bicarb 22 creatinine 0.64 BUN 15 glucose 114 alk phos is 44 AST 17 ALT 9 lipase 20.  Patient is given IV fluids in the emergency room and started on Rocephin for antibiotic coverage.  Hospitalist service been asked admit for further management  Hospital Course:  The patient was admitted to a telemetry bed. She is given IV fluids, IV Rocephin, antiemetics and low dose dilaudid for pain control. Urology was consulted. Dr. Lahoma Crocker evaluated the patient and determined that no surgical intervention was required. Urine cultures were collected x 2. On both occassions the cultures demonstrated polymicrobial growth. The patient was given Rocephin IV. Blood cultures x 2 have had no growth. The patient's pain has improved. She has been converted to oral antibiotics and will be discharged to home  in fair condition.  She will follow up with urology as outpatient in 2-4 weeks after her antibiotics have concluded. The patient is going through the process of signing up for Medicaid.  Today's assessment: S: The patient is resting comfortably. No new complaints. O: Vitals:  Vitals:   05/02/21 0534 05/02/21 0907  BP: 111/72 112/68  Pulse: 65 78  Resp: 17 17  Temp: 98.1 F (36.7 C) 98.2 F (36.8 C)  SpO2: 100% 100%     Exam:  Constitutional:  The patient is awake, alert, and oriented x 3. No acute distress. Respiratory:  No increased work of breathing. No wheezes, rales, or rhonchi No tactile fremitus Cardiovascular:  Regular rate and rhythm No murmurs, ectopy, or gallups. No lateral PMI. No thrills. Abdomen:  Abdomen is soft, non-tender, non-distended No hernias, masses, or organomegaly Normoactive bowel sounds.  Musculoskeletal:  No cyanosis, clubbing, or edema Skin:  No rashes, lesions, ulcers palpation of skin: no induration or nodules Neurologic:  CN 2-12 intact Sensation all 4 extremities intact Psychiatric:  Mental status Mood, affect appropriate Orientation to person, place, time  judgment and insight appear intact   Discharge Instructions  Discharge Instructions     Activity as tolerated - No restrictions   Complete by: As directed    Call MD for:  persistant nausea and vomiting   Complete by: As directed    Call MD for:  severe uncontrolled pain   Complete by: As directed    Diet general   Complete by: As directed    Discharge instructions   Complete by: As directed    Discharge to home. Complete antibiotics. Follow up with Dr. Davis Gourd (Urology) as outpatient after antibiotics are completed.  Follow up with PCP in 7-10 days.   Increase activity slowly   Complete by: As directed       Allergies as of 05/02/2021   No Known Allergies      Medication List     STOP taking these medications    meclizine 25 MG tablet Commonly known as: ANTIVERT   traMADol 50 MG tablet Commonly known as: ULTRAM       TAKE these medications    acetaminophen 325 MG tablet Commonly known as: TYLENOL Take 2 tablets (650 mg total) by mouth every 6 (six) hours as needed for mild pain or moderate pain.   amoxicillin-clavulanate 875-125 MG tablet Commonly known as: Augmentin Take 1 tablet by mouth 2 (two) times daily for 8 days.   HYDROcodone-acetaminophen  5-325 MG tablet Commonly known as: NORCO/VICODIN Take 1-2 tablets by mouth every 6 (six) hours as needed for moderate pain or severe pain.       No Known Allergies  The results of significant diagnostics from this hospitalization (including imaging, microbiology, ancillary and laboratory) are listed below for reference.    Significant Diagnostic Studies: CT Renal Stone Study  Result Date: 04/27/2021 CLINICAL DATA:  Flank pain.  Concern for kidney stone. EXAM: CT ABDOMEN AND PELVIS WITHOUT CONTRAST TECHNIQUE: Multidetector CT imaging of the abdomen and pelvis was performed following the standard protocol without IV contrast. COMPARISON:  CT abdomen pelvis dated 03/01/2021. FINDINGS: Evaluation of this exam is limited in the absence of intravenous contrast. Lower chest: The visualized lung bases are clear. No intra-abdominal free air or free fluid. Hepatobiliary: Multiple small liver cysts and subcentimeter hypodensities which are not characterized on this CT. No intrahepatic biliary dilatation. The gallbladder is unremarkable. Pancreas: Unremarkable. No pancreatic ductal dilatation or surrounding inflammatory changes.  Spleen: Normal in size without focal abnormality. Adrenals/Urinary Tract: The adrenal glands unremarkable. Bilateral renal cysts in keeping with provided history of polycystic kidney disease. There has been interval placement of a right ureteral stent with proximal tip in the mid to upper pole collecting system and distal end within the urinary bladder. There is a 6 mm nonobstructing right renal upper pole calculus there is a 3 mm stone along the distal stent within the urinary bladder, likely corresponding to the stone seen on the prior CT at the right ureterovesical junction. Several additional faint nonobstructing right renal calculi as well as a 3 mm nonobstructing stone in the inferior pole of the left kidney. There is no hydronephrosis on the left. The urinary bladder is  unremarkable. Stomach/Bowel: There is no bowel obstruction or active inflammation. The appendix is normal. Vascular/Lymphatic: The abdominal aorta and IVC unremarkable. No portal venous gas. There is no adenopathy. Reproductive: The uterus is anteverted and grossly unremarkable. No adnexal masses. Other: None Musculoskeletal: No acute or significant osseous findings. IMPRESSION: 1. Interval placement of a right ureteral stent. A 3 mm stone along the distal stent within the urinary bladder likely corresponding to the right UVJ stone seen on the prior CT. Additional nonobstructing bilateral renal calculi. No hydronephrosis on the left. 2. Bilateral renal cysts in keeping with provided history of polycystic kidney disease. 3. No bowel obstruction. Normal appendix. Electronically Signed   By: Anner Crete M.D.   On: 04/27/2021 22:19    Microbiology: Recent Results (from the past 240 hour(s))  Resp Panel by RT-PCR (Flu A&B, Covid) Nasopharyngeal Swab     Status: None   Collection Time: 04/27/21  6:34 PM   Specimen: Nasopharyngeal Swab; Nasopharyngeal(NP) swabs in vial transport medium  Result Value Ref Range Status   SARS Coronavirus 2 by RT PCR NEGATIVE NEGATIVE Final    Comment: (NOTE) SARS-CoV-2 target nucleic acids are NOT DETECTED.  The SARS-CoV-2 RNA is generally detectable in upper respiratory specimens during the acute phase of infection. The lowest concentration of SARS-CoV-2 viral copies this assay can detect is 138 copies/mL. A negative result does not preclude SARS-Cov-2 infection and should not be used as the sole basis for treatment or other patient management decisions. A negative result may occur with  improper specimen collection/handling, submission of specimen other than nasopharyngeal swab, presence of viral mutation(s) within the areas targeted by this assay, and inadequate number of viral copies(<138 copies/mL). A negative result must be combined with clinical observations,  patient history, and epidemiological information. The expected result is Negative.  Fact Sheet for Patients:  EntrepreneurPulse.com.au  Fact Sheet for Healthcare Providers:  IncredibleEmployment.be  This test is no t yet approved or cleared by the [place] FDA and  has been authorized for detection and/or diagnosis of SARS-CoV-2 by FDA under an Emergency Use Authorization (EUA). This EUA will remain  in effect (meaning this test can be used) for the duration of the COVID-19 declaration under Section 564(b)(1) of the Act, 21 U.S.C.section 360bbb-3(b)(1), unless the authorization is terminated  or revoked sooner.       Influenza A by PCR NEGATIVE NEGATIVE Final   Influenza B by PCR NEGATIVE NEGATIVE Final    Comment: (NOTE) The Xpert Xpress SARS-CoV-2/FLU/RSV plus assay is intended as an aid in the diagnosis of influenza from Nasopharyngeal swab specimens and should not be used as a sole basis for treatment. Nasal washings and aspirates are unacceptable for Xpert Xpress SARS-CoV-2/FLU/RSV testing.  Fact Sheet for Patients: EntrepreneurPulse.com.au  Fact Sheet for Healthcare Providers: IncredibleEmployment.be  This test is not yet approved or cleared by the [place] FDA and has been authorized for detection and/or diagnosis of SARS-CoV-2 by FDA under an Emergency Use Authorization (EUA). This EUA will remain in effect (meaning this test can be used) for the duration of the COVID-19 declaration under Section 564(b)(1) of the Act, 21 U.S.C. section 360bbb-3(b)(1), unless the authorization is terminated or revoked.  Performed at [facility], [place] [address], [place], [place] [address]   Urine Culture     Status: Abnormal   Collection Time: 04/28/21  3:53 AM   Specimen: Urine, Clean Catch  Result Value Ref Range Status   Specimen Description URINE, CLEAN CATCH  Final   Special Requests    Final    NONE Performed at [facility], [place] [address], [place], [place] [address]    Culture MULTIPLE SPECIES PRESENT, SUGGEST RECOLLECTION (A)  Final   Report Status 04/29/2021 FINAL  Final  Urine Culture     Status: Abnormal   Collection Time: 04/29/21  4:29 PM   Specimen: Urine, Clean Catch  Result Value Ref Range Status   Specimen Description URINE, CLEAN CATCH  Final   Special Requests   Final    NONE Performed at [facility], [place] [address] [place], [place] [address]    Culture MULTIPLE SPECIES PRESENT, SUGGEST RECOLLECTION (A)  Final   Report Status 04/30/2021 FINAL  Final     Labs: Basic Metabolic Panel: Recent Labs  Lab 04/27/21 1833 04/28/21 0717 04/29/21 0436 04/30/21 0503 05/02/21 0329  NA 135 134* 135 135 136  K 3.5 3.6 3.6 3.6 3.7  CL 102 104 103 103 104  CO2 22 23 23 24 26   GLUCOSE 114* 99 96 91 87  BUN 15 13 12 12 8   CREATININE 0.64 0.60 0.62 0.54 0.59  CALCIUM 9.6 8.7* 8.8* 8.5* 8.7*  MG  --   --  1.5* 1.7  --   PHOS  --   --  2.7 3.0  --    Liver Function Tests: Recent Labs  Lab 04/27/21 1833 04/29/21 0436 04/30/21 0503 05/02/21 0329  AST 17  --   --  11*  ALT 9  --   --  12  ALKPHOS 44  --   --  33*  BILITOT 0.8  --   --  0.4  PROT 7.5  --   --  5.9*  ALBUMIN 3.8 2.9* 2.7* 2.8*   Recent Labs  Lab 04/27/21 1833  LIPASE 20   No results for input(s): AMMONIA in the last 168 hours. CBC: Recent Labs  Lab 04/27/21 1833 04/28/21 0717 04/29/21 0436 04/30/21 0503 05/02/21 0329  WBC 13.6* 11.8* 9.5 7.8 5.3  NEUTROABS 10.9*  --   --   --  2.9  HGB 11.1* 9.5* 9.1* 8.8* 8.7*  HCT 33.4* 28.5* 27.7* 27.0* 27.1*  MCV 92.5 93.1 94.5 94.4 94.8  PLT 326 277 274 283 305   Cardiac Enzymes: No results for input(s): CKTOTAL, CKMB, CKMBINDEX, TROPONINI in the last 168 hours. BNP: BNP (last 3 results) No results for input(s): BNP in the last 8760 hours.  ProBNP (last 3 results) No results for input(s): PROBNP in the last 8760  hours.  CBG: No results for input(s): GLUCAP in the last 168 hours.  Principal Problem:   Pyelonephritis Active Problems:   Nausea and vomiting   Time coordinating discharge: 38 minutes.  Signed:  Yvonnie Schinke, DO [facility]  05/02/2021, 1:54 PM

## 2021-05-02 NOTE — TOC Transition Note (Signed)
Transition of Care [facility]) - CM/SW Discharge Note   Patient Details  Name: Colleen Oneill MRN: [id number] Date of Birth: 05/23/1991  [facility] CM/SW Contact:  Tom-Johnson, Hershal Coria, RN Phone Number: 05/02/2021, 4:26 PM   Clinical Narrative:    Patient is scheduled for discharge today. Patient states she was told by [employer] that she is not eligible for Medicaid because her children does not live with her. States she cannot have her children live at a motel. She is trying to get her an apartment then get her kids and then reapply for Medicaid. Follow up appointment scheduled with [facility] for December 19 th at 0930. Info on AVS. To follow up with Urology outpatient. Patient states one of her family members will transport home. Medication Co pay of $18 paid for from [facility] petty cash. No further TOC needs noted.    Final next level of care: Home/Self Care Barriers to Discharge: No Barriers Identified   Patient Goals and CMS Choice Patient states their goals for this hospitalization and ongoing recovery are:: To go home CMS Medicare.gov Compare Post Acute Care list provided to:: Patient Choice offered to / list presented to : NA  Discharge Placement                       Discharge Plan and Services   Discharge Planning Services: CM Consult            DME Arranged: N/A DME Agency: NA       HH Arranged: NA HH Agency: NA        Social Determinants of Health (SDOH) Interventions     Readmission Risk Interventions No flowsheet data found.

## 2021-05-08 ENCOUNTER — Encounter: Payer: Self-pay | Admitting: Nurse Practitioner

## 2021-06-10 ENCOUNTER — Inpatient Hospital Stay: Payer: Self-pay | Admitting: Internal Medicine

## 2021-10-17 ENCOUNTER — Ambulatory Visit: Payer: Self-pay

## 2021-10-17 NOTE — Telephone Encounter (Signed)
?  Chief Complaint: Blood in urine ?Symptoms: urinary frequency, flank pain rt side, urgency, blood in urine ?Frequency: Since Tuesday ?Pertinent Negatives: Patient denies current fever - pt had fever earlier this week. ?Disposition: [x] ED /[x] Urgent Care (no appt availability in office) / [] Appointment(In office/virtual)/ []  [facility]/ [] Home Care/ [] Refused Recommended Disposition /[] [place] [facility]/ []  Follow-up with PCP ?Additional Notes: Per pt she has polycystic kidney disease. Pt experiencing frequency, urgency, blood in urine, resolved fever, and right flank pain. Pt has no PCP - pt will go to UC or ED. Pt requested that I discuss medical hx with supervisor  - Renee. Pt will leave work early today to seek medical care. ?Reason for Disposition ? Side (flank) or lower back pain present ? ?Answer Assessment - Initial Assessment Questions ?1. SYMPTOM: "What's the main symptom you're concerned about?" (e.g., frequency, incontinence) ?    Frequency, pain, urgency, blood in urine ?2. ONSET: "When did the urinary s/s  start?" ?    2 weeks ago ?3. PAIN: "Is there any pain?" If Yes, ask: "How bad is it?" (Scale: 1-10; mild, moderate, severe) ?    yes ?4. CAUSE: "What do you think is causing the symptoms?" ?    unsure ?5. OTHER SYMPTOMS: "Do you have any other symptoms?" (e.g., fever, flank pain, blood in urine, pain with urination) ?    Blood in urine flank pain ?6. PREGNANCY: "Is there any chance you are pregnant?" "When was your last menstrual period?" ?    no ? ?Protocols used: Urinary Symptoms-A-AH ? ?

## 2021-10-22 ENCOUNTER — Emergency Department (HOSPITAL_COMMUNITY): Admission: EM | Admit: 2021-10-22 | Discharge: 2021-10-22 | Discharge status: Against Medical Advice | Payer: MEDICAID

## 2021-10-22 NOTE — ED Notes (Signed)
X1 for vitals with no response 

## 2021-10-22 NOTE — ED Notes (Signed)
No response for triage.  

## 2022-01-15 ENCOUNTER — Inpatient Hospital Stay (HOSPITAL_COMMUNITY)
Admission: EM | Admit: 2022-01-15 | Discharge: 2022-01-18 | DRG: 690 | Disposition: A | Discharge status: Home / Self Care | Payer: 59 | Attending: Internal Medicine | Admitting: Internal Medicine

## 2022-01-15 ENCOUNTER — Encounter (HOSPITAL_COMMUNITY): Payer: Self-pay | Admitting: Emergency Medicine

## 2022-01-15 DIAGNOSIS — Q613 Polycystic kidney, unspecified: Secondary | ICD-10-CM

## 2022-01-15 DIAGNOSIS — Z87440 Personal history of urinary (tract) infections: Secondary | ICD-10-CM

## 2022-01-15 DIAGNOSIS — Z87442 Personal history of urinary calculi: Secondary | ICD-10-CM

## 2022-01-15 DIAGNOSIS — Z841 Family history of disorders of kidney and ureter: Secondary | ICD-10-CM

## 2022-01-15 DIAGNOSIS — N12 Tubulo-interstitial nephritis, not specified as acute or chronic: Principal | ICD-10-CM | POA: Diagnosis present

## 2022-01-15 DIAGNOSIS — D631 Anemia in chronic kidney disease: Secondary | ICD-10-CM | POA: Diagnosis present

## 2022-01-15 DIAGNOSIS — J320 Chronic maxillary sinusitis: Secondary | ICD-10-CM | POA: Diagnosis present

## 2022-01-15 DIAGNOSIS — K029 Dental caries, unspecified: Secondary | ICD-10-CM | POA: Diagnosis present

## 2022-01-15 DIAGNOSIS — M272 Inflammatory conditions of jaws: Secondary | ICD-10-CM | POA: Diagnosis present

## 2022-01-15 DIAGNOSIS — E876 Hypokalemia: Secondary | ICD-10-CM | POA: Diagnosis present

## 2022-01-15 DIAGNOSIS — N390 Urinary tract infection, site not specified: Secondary | ICD-10-CM | POA: Diagnosis present

## 2022-01-15 DIAGNOSIS — K011 Impacted teeth: Secondary | ICD-10-CM | POA: Diagnosis present

## 2022-01-15 LAB — CBC
HCT: 35.7 % — ABNORMAL LOW (ref 36.0–46.0)
Hemoglobin: 11.9 g/dL — ABNORMAL LOW (ref 12.0–15.0)
MCH: 31.7 pg (ref 26.0–34.0)
MCHC: 33.3 g/dL (ref 30.0–36.0)
MCV: 95.2 fL (ref 80.0–100.0)
Platelets: 285 10*3/uL (ref 150–400)
RBC: 3.75 MIL/uL — ABNORMAL LOW (ref 3.87–5.11)
RDW: 12.3 % (ref 11.5–15.5)
WBC: 7.7 10*3/uL (ref 4.0–10.5)
nRBC: 0 % (ref 0.0–0.2)

## 2022-01-15 LAB — URINALYSIS, ROUTINE W REFLEX MICROSCOPIC
Bilirubin Urine: NEGATIVE
Glucose, UA: NEGATIVE mg/dL
Ketones, ur: NEGATIVE mg/dL
Nitrite: POSITIVE — AB
Protein, ur: 100 mg/dL — AB
RBC / HPF: >50 RBC/hpf — ABNORMAL HIGH (ref 0–5)
Specific Gravity, Urine: 1.018 (ref 1.005–1.030)
WBC, UA: >50 WBC/hpf — ABNORMAL HIGH (ref 0–5)
pH: 6 (ref 5.0–8.0)

## 2022-01-15 MED ORDER — SODIUM CHLORIDE 0.9 % IV BOLUS
1000.0000 mL | Freq: Once | INTRAVENOUS | Status: AC
  Administered 2022-01-15: 1000 mL via INTRAVENOUS

## 2022-01-15 MED ORDER — ONDANSETRON HCL 4 MG/2ML IJ SOLN
4.0000 mg | Freq: Once | INTRAMUSCULAR | Status: AC
Start: 2022-01-15 — End: 2022-01-15
  Administered 2022-01-15: 4 mg via INTRAVENOUS
  Filled 2022-01-15: qty 2

## 2022-01-15 MED ORDER — CEFTRIAXONE SODIUM 1 G IJ SOLR
1.0000 g | Freq: Once | INTRAMUSCULAR | Status: AC
  Administered 2022-01-15: 1 g via INTRAVENOUS
  Filled 2022-01-15: qty 10

## 2022-01-15 MED ORDER — LIDOCAINE VISCOUS HCL 2 % MT SOLN
15.0000 mL | Freq: Once | OROMUCOSAL | Status: AC
  Administered 2022-01-15: 15 mL via OROMUCOSAL
  Filled 2022-01-15: qty 15

## 2022-01-15 MED ORDER — MORPHINE SULFATE (PF) 4 MG/ML IV SOLN
4.0000 mg | Freq: Once | INTRAVENOUS | Status: AC
  Administered 2022-01-15: 4 mg via INTRAVENOUS
  Filled 2022-01-15: qty 1

## 2022-01-15 NOTE — Discharge Instructions (Addendum)
It was a pleasure caring for you today in the emergency department.  Please return to the emergency department for any worsening or worrisome symptoms.   Please follow up with dentist

## 2022-01-15 NOTE — ED Provider Notes (Signed)
[facility] EMERGENCY DEPARTMENT Provider Note   CSN: [id number] Arrival date & time: 01/15/22  1734     History {Add pertinent medical, surgical, social history, OB history to HPI:1} Chief Complaint  Patient presents with   Dysuria    Colleen Oneill is a 31 y.o. female.  Patient as above with significant medical history as below, including polycystic kidney disease, prior nephrolithiasis who presents to the ED with complaint of flank pain, suprapubic pain, dysuria, urgency, nausea, vomiting, low-grade fever.  This began a few days ago.  Gradually worsening.  No abdominal trauma, no change to bowel function.  Hematuria.  No rashes.  No concern for STI.  No abnormal vaginal discharge or bleeding.  LMP was approximately 2 weeks ago.  No recent travel,p.o. intake, no sick contacts.  No medications prior to arrival  Also complaining of right-sided dental pain.  Approximate site of wisdom tooth that is impacted.       Past Medical History:  Diagnosis Date   Medical history non-contributory    Polycystic kidney disease     Past Surgical History:  Procedure Laterality Date   CESAREAN SECTION     CESAREAN SECTION N/A 08/09/2012   Procedure: Repeat CESAREAN SECTION of baby boy  at [place] 9/9;  Surgeon: Frederico Hamman, MD;  Location: [facility];  Service: Obstetrics;  Laterality: N/A;   CESAREAN SECTION N/A 08/23/2013   Procedure: CESAREAN SECTION;  Surgeon: Shelly Bombard, MD;  Location: [facility];  Service: Obstetrics;  Laterality: N/A;   CYSTOSCOPY/URETEROSCOPY/HOLMIUM LASER/STENT PLACEMENT Right 03/02/2021   Procedure: CYSTOSCOPY; RIGHT RETROGRADE PYELOGRAM; RIGTH URETERAL STENT PLACEMENT;  Surgeon: Remi Haggard, MD;  Location: [facility];  Service: Urology;  Laterality: Right;     The history is provided by the patient. No language interpreter was used.  Dysuria Associated symptoms: abdominal pain and flank pain   Associated symptoms: no fever, no nausea and no  vomiting        Home Medications Prior to Admission medications   Medication Sig Start Date End Date Taking? Authorizing Provider  acetaminophen (TYLENOL) 325 MG tablet Take 2 tablets (650 mg total) by mouth every 6 (six) hours as needed for mild pain or moderate pain. 03/04/21   Arrien, Jimmy Picket, MD  HYDROcodone-acetaminophen (NORCO/VICODIN) 5-325 MG tablet Take 1-2 tablets by mouth every 6 (six) hours as needed for moderate pain or severe pain. 05/02/21   Swayze, Ava, DO      Allergies    Patient has no known allergies.    Review of Systems   Review of Systems  Constitutional:  Negative for chills and fever.  HENT:  Negative for facial swelling and trouble swallowing.   Eyes:  Negative for photophobia and visual disturbance.  Respiratory:  Negative for cough and shortness of breath.   Cardiovascular:  Negative for chest pain and palpitations.  Gastrointestinal:  Positive for abdominal pain. Negative for nausea and vomiting.  Endocrine: Negative for polydipsia and polyuria.  Genitourinary:  Positive for dysuria, flank pain and urgency. Negative for difficulty urinating and hematuria.  Musculoskeletal:  Negative for gait problem and joint swelling.  Skin:  Negative for pallor and rash.  Neurological:  Negative for syncope and headaches.  Psychiatric/Behavioral:  Negative for agitation and confusion.     Physical Exam Updated Vital Signs BP (!) 133/96   Pulse 73   Temp 98.7 F (37.1 C) (Oral)   Resp 18   SpO2 100%  Physical Exam Vitals  and nursing note reviewed.  Constitutional:      General: She is not in acute distress.    Appearance: Normal appearance.  HENT:     Head: Normocephalic and atraumatic.     Right Ear: External ear normal.     Left Ear: External ear normal.     Nose: Nose normal.     Mouth/Throat:     Mouth: Mucous membranes are moist.     Tongue: Tongue does not deviate from midline.     Pharynx: Oropharynx is clear. Uvula midline. No  oropharyngeal exudate.     Comments: Impacted wisdom tooth right upper.  No evidence of abscess.  No obvious dental caries Eyes:     General: No scleral icterus.       Right eye: No discharge.        Left eye: No discharge.  Cardiovascular:     Rate and Rhythm: Normal rate and regular rhythm.     Pulses: Normal pulses.     Heart sounds: Normal heart sounds.  Pulmonary:     Effort: Pulmonary effort is normal. No respiratory distress.     Breath sounds: Normal breath sounds.  Abdominal:     General: Abdomen is flat.     Palpations: Abdomen is soft.     Tenderness: There is abdominal tenderness. There is right CVA tenderness. There is no guarding or rebound.    Musculoskeletal:        General: Normal range of motion.     Cervical back: Full passive range of motion without pain and normal range of motion.     Right lower leg: No edema.     Left lower leg: No edema.  Skin:    General: Skin is warm and dry.     Capillary Refill: Capillary refill takes less than 2 seconds.  Neurological:     Mental Status: She is alert and oriented to person, place, and time.     GCS: GCS eye subscore is 4. GCS verbal subscore is 5. GCS motor subscore is 6.  Psychiatric:        Mood and Affect: Mood normal.        Behavior: Behavior normal.     ED Results / Procedures / Treatments   Labs (all labs ordered are listed, but only abnormal results are displayed) Labs Reviewed  URINALYSIS, ROUTINE W REFLEX MICROSCOPIC - Abnormal; Notable for the following components:      Result Value   APPearance CLOUDY (*)    Hgb urine dipstick LARGE (*)    Protein, ur 100 (*)    Nitrite POSITIVE (*)    Leukocytes,Ua LARGE (*)    RBC / HPF >50 (*)    WBC, UA >50 (*)    Bacteria, UA MANY (*)    All other components within normal limits  URINE CULTURE  CBC  BASIC METABOLIC PANEL  I-STAT BETA HCG BLOOD, ED ([facility], [facility], [facility] ONLY)    EKG None  Radiology No results found.  Procedures Procedures  {Document  cardiac monitor, telemetry assessment procedure when appropriate:1}  Medications Ordered in ED Medications  sodium chloride 0.9 % bolus 1,000 mL (has no administration in time range)  cefTRIAXone (ROCEPHIN) 1 g in sodium chloride 0.9 % 100 mL IVPB (has no administration in time range)  ondansetron (ZOFRAN) injection 4 mg (has no administration in time range)    ED Course/ Medical Decision Making/ A&P  Medical Decision Making Amount and/or Complexity of Data Reviewed Labs: ordered. Radiology: ordered.  Risk Prescription drug management.    CC: abd/flank pain  This patient presents to the Emergency Department for the above complaint. This involves an extensive number of treatment options and is a complaint that carries with it a high risk of complications and morbidity. Vital signs were reviewed. Serious etiologies considered.  Differential diagnosis includes but is not exclusive to ectopic pregnancy, ovarian cyst, ovarian torsion, acute appendicitis, urinary tract infection, endometriosis, bowel obstruction, hernia, colitis, renal colic, gastroenteritis, volvulus etc.  Record review:  Previous records obtained and reviewed prior ED visits, prior labs and imaging. 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  No longer follows with urology Additional history obtained from ***  Medical and surgical history as noted above.   Work up as above, notable for:  Labs & imaging results that were available during my care of the patient were visualized by me and considered in my medical decision making.  Physical exam as above.   I ordered imaging studies which included ***. I visualized the imaging, interpreted images, and I agree with radiologist interpretation. ***  Cardiac monitoring reviewed and interpreted personally which shows ***   Personally discussed patient care with consultant; ***  Management: ***  ED Course:      Reassessment:  ***  Admission was considered.                Social determinants of health include -  Social History   Socioeconomic History   Marital status: Single    Spouse name: Not on file   Number of children: Not on file   Years of education: Not on file   Highest education level: Not on file  Occupational History   Not on file  Tobacco Use   Smoking status: Never   Smokeless tobacco: Never  Substance and Sexual Activity   Alcohol use: No   Drug use: No   Sexual activity: Not Currently    Birth control/protection: None  Other Topics Concern   Not on file  Social History Narrative   Not on file   Social Determinants of Health   Financial Resource Strain: Not on file  Food Insecurity: Not on file  Transportation Needs: Not on file  Physical Activity: Not on file  Stress: Not on file  Social Connections: Not on file  Intimate Partner Violence: Not on file      This chart was dictated using voice recognition software.  Despite best efforts to proofread,  errors can occur which can change the documentation meaning.   {Document critical care time when appropriate:1} {Document review of labs and clinical decision tools ie heart score, Chads2Vasc2 etc:1}  {Document your independent review of radiology images, and any outside records:1} {Document your discussion with family members, caretakers, and with consultants:1} {Document social determinants of health affecting pt's care:1} {Document your decision making why or why not admission, treatments were needed:1} Final Clinical Impression(s) / ED Diagnoses Final diagnoses:  None    Rx / DC Orders ED Discharge Orders     None

## 2022-01-15 NOTE — ED Triage Notes (Signed)
Patient complains of dysuria that started a few weeks ago, also complains of right upper and lower dental pain that started three days ago. Patient is alert, oriented, ambulatory, and in no apparent distress at this time.

## 2022-01-15 NOTE — ED Provider Triage Note (Signed)
Emergency Medicine Provider Triage Evaluation Note  Colleen Oneill , a 31 y.o. female  was evaluated in triage.  Pt complains of dysuria and dental pain. States that both began 3 days ago. Denies fevers, chills, difficulty swallowing, nausea, vomiting, diarrhea, or abdominal pain  Review of Systems  Positive:  Negative:   Physical Exam  BP (!) 133/96   Pulse 73   Temp 98.7 F (37.1 C) (Oral)   Resp 18   SpO2 100%  Gen:   Awake, no distress   Resp:  Normal effort  MSK:   Moves extremities without difficulty  Other:    Medical Decision Making  Medically screening exam initiated at 6:22 PM.  Appropriate orders placed.  [employer] was informed that the remainder of the evaluation will be completed by another provider, this initial triage assessment does not replace that evaluation, and the importance of remaining in the ED until their evaluation is complete.     Silva Bandy, PA-C 01/15/22 1823

## 2022-01-16 ENCOUNTER — Emergency Department (HOSPITAL_COMMUNITY): Payer: 59

## 2022-01-16 ENCOUNTER — Encounter (HOSPITAL_COMMUNITY): Payer: Self-pay | Admitting: Internal Medicine

## 2022-01-16 ENCOUNTER — Observation Stay (HOSPITAL_COMMUNITY): Payer: 59

## 2022-01-16 DIAGNOSIS — N390 Urinary tract infection, site not specified: Secondary | ICD-10-CM

## 2022-01-16 DIAGNOSIS — M272 Inflammatory conditions of jaws: Secondary | ICD-10-CM | POA: Diagnosis not present

## 2022-01-16 LAB — BASIC METABOLIC PANEL
Anion gap: 11 (ref 5–15)
BUN: 21 mg/dL — ABNORMAL HIGH (ref 6–20)
CO2: 23 mmol/L (ref 22–32)
Calcium: 9.8 mg/dL (ref 8.9–10.3)
Chloride: 105 mmol/L (ref 98–111)
Creatinine, Ser: 0.74 mg/dL (ref 0.44–1.00)
GFR, Estimated: >60 mL/min (ref >60)
Glucose, Bld: 89 mg/dL (ref 70–99)
Potassium: 3.6 mmol/L (ref 3.5–5.1)
Sodium: 139 mmol/L (ref 135–145)

## 2022-01-16 LAB — URINE CULTURE

## 2022-01-16 LAB — I-STAT BETA HCG BLOOD, ED (MC, WL, AP ONLY): I-stat hCG, quantitative: <5 m[IU]/mL (ref <5)

## 2022-01-16 MED ORDER — ONDANSETRON HCL 4 MG/2ML IJ SOLN
4.0000 mg | Freq: Four times a day (QID) | INTRAMUSCULAR | Status: DC | PRN
  Administered 2022-01-16: 4 mg via INTRAVENOUS
  Filled 2022-01-16: qty 2

## 2022-01-16 MED ORDER — SODIUM CHLORIDE 0.9 % IV SOLN: 1.0000 g | INTRAVENOUS | Status: DC

## 2022-01-16 MED ORDER — SODIUM CHLORIDE 0.9 % IV SOLN
INTRAVENOUS | Status: AC
Start: 2022-01-16 — End: 2022-01-16

## 2022-01-16 MED ORDER — ENOXAPARIN SODIUM 40 MG/0.4ML IJ SOSY
40.0000 mg | PREFILLED_SYRINGE | INTRAMUSCULAR | Status: DC
  Administered 2022-01-16 – 2022-01-18 (×3): 40 mg via SUBCUTANEOUS
  Filled 2022-01-16 (×3): qty 0.4

## 2022-01-16 MED ORDER — ONDANSETRON HCL 4 MG PO TABS: 4.0000 mg | ORAL_TABLET | Freq: Four times a day (QID) | ORAL | Status: DC | PRN

## 2022-01-16 MED ORDER — SODIUM CHLORIDE 0.9 % IV SOLN
3.0000 g | Freq: Four times a day (QID) | INTRAVENOUS | Status: DC
  Administered 2022-01-16 – 2022-01-17 (×4): 3 g via INTRAVENOUS
  Filled 2022-01-16 (×6): qty 8

## 2022-01-16 MED ORDER — OXYCODONE-ACETAMINOPHEN 5-325 MG PO TABS
1.0000 | ORAL_TABLET | ORAL | Status: DC | PRN
  Administered 2022-01-16: 1 via ORAL
  Filled 2022-01-16: qty 1

## 2022-01-16 MED ORDER — MORPHINE SULFATE (PF) 4 MG/ML IV SOLN
4.0000 mg | INTRAVENOUS | Status: DC | PRN
  Administered 2022-01-16 – 2022-01-18 (×5): 4 mg via INTRAVENOUS
  Filled 2022-01-16 (×5): qty 1

## 2022-01-16 MED ORDER — POLYETHYLENE GLYCOL 3350 17 G PO PACK: 17.0000 g | PACK | Freq: Every day | ORAL | Status: DC | PRN

## 2022-01-16 MED ORDER — IOHEXOL 300 MG/ML  SOLN
75.0000 mL | Freq: Once | INTRAMUSCULAR | Status: AC | PRN
  Administered 2022-01-16: 75 mL via INTRAVENOUS

## 2022-01-16 MED ORDER — ACETAMINOPHEN 650 MG RE SUPP: 650.0000 mg | Freq: Four times a day (QID) | RECTAL | Status: DC | PRN

## 2022-01-16 MED ORDER — ACETAMINOPHEN 325 MG PO TABS: 650.0000 mg | ORAL_TABLET | Freq: Four times a day (QID) | ORAL | Status: DC | PRN

## 2022-01-16 NOTE — Progress Notes (Signed)
[facility] PLAN OF CARE NOTE Patient: Colleen Oneill [id number]   PCP: Orion Crook I, NP DOB: 02/25/1991   DOA: 01/15/2022   DOS: 01/16/2022    Patient was admitted by my colleague earlier on 01/16/2022. I have reviewed the H&P as well as assessment and plan and agree with the same. Important changes in the plan are listed below.  Plan of care: Principal Problem:   Complicated UTI (urinary tract infection) Active Problems:   Odontogenic infection of jaw CT maxillofacial negative for any significant abscess or pus but shows evidence of maxillary and dental disease likely infectious in nature. Currently I will switch to IV Unasyn. Last E. coli isolated in urine culture was sensitive to Unasyn.  Author: Lynden Oxford, MD [facility] 01/16/2022 6:42 PM   If 7PM-7AM, please contact night-coverage at [email or web address]

## 2022-01-16 NOTE — ED Provider Notes (Signed)
Patient signed out pending CT scan.  CT to rule out obstructive nephropathy.  Patient with evidence of UTI.  Likely Pyelo.  2:53 AM CT scan confirms right-sided double-pigtail ureteral stent in position.  No obstructing renal calculus.  Discussed with urology.  See clinical course below.  Will admit for IV antibiotics.  Interestingly, prior urine cultures have grown out multiple flora without speciation.  I discussed with the patient that it is imperative that she follow-up with an outpatient after being discharged.  Ureteral stent cannot be removed in the setting of acute infection.  Patient stated understanding.  Clinical Course as of 01/16/22 0254  Thu Jan 16, 2022  0252 Spoke to Dr. Lissa Hoard, who recommends admission for IV antibiotics.  Does not feel that urology needs to be formally consulted.  Cannot remove stent while acutely infected.  Stressed the importance that the patient must follow-up as an outpatient. [CH]    Clinical Course User Index [CH] Shavy Beachem, Mayer Masker, MD    Problem List Items Addressed This Visit       Genitourinary   Pyelonephritis - Primary      Shon Baton, MD 01/16/22 [phone]

## 2022-01-16 NOTE — Assessment & Plan Note (Signed)
   Several weeks of increasing malaise weakness and flank pain in the setting of a urinary tract infection complicated by retained stent  ED provider initiated intravenous ceftriaxone which will be continued at this time  Hydrating patient aggressively with intravenous isotonic fluids  Following both blood and urine cultures  As needed opiate-based analgesics for associated substantial pain  As needed antiemetics for associated nausea  EDP discussed case with Dr. Marnee Guarneri with urology who recommended treatment of current infection with outpatient follow-up with urology for eventual stent removal.

## 2022-01-16 NOTE — Progress Notes (Signed)
Pharmacy Antibiotic Note  Colleen Oneill is a 31 y.o. female admitted on 01/15/2022 with complicated UTI and dental/ jaw infection. Ceftriaxone given last night. Pharmacy has been consulted for Unasyn dosing, broadening coverage.  Hx renal stones requiring stent 02/2021 and recurrent UTIs.  No definitive evidence of pyelonephritis or infected stones per renal CT and probable infections cellulitis/phlegmon per maxillofacial CT.    Afebrile, WBC 7.7, SCr 0.74.  Plan: Unasyn 3gm IV q6h. Will follow renal function, culture data, clinical progress and antibiotic plans.  Temp (24hrs), Avg:98.6 F (37 C), Min:98.6 F (37 C), Max:98.7 F (37.1 C)  No Known Allergies  Antimicrobials this admission:  Ceftriaxone 1 on 7/26  Unasyn 7/27 >>  Microbiology results:  7/26 urine: sent  7/27 Blood:   Thank you for allowing pharmacy to be a part of this patient's care.  Dennie Fetters, RPh 01/16/2022 1:23 PM

## 2022-01-16 NOTE — Assessment & Plan Note (Signed)
   Significant gingival tenderness and swelling on exam with complaints of ongoing pain for several weeks.  We will obtain CT imaging of the facial bones to determine if this is another focus of infection  If there is evidence of significant abscess formation or osteomyelitis will obtain OMFS consultation and adjust antibiotic therapy at that time

## 2022-01-16 NOTE — H&P (Signed)
History and Physical    Patient: Colleen Oneill MRN: [id number] DOA: 01/15/2022  Date of Service: the patient was seen and examined on 01/16/2022  Patient coming from: Home  Chief Complaint:  Chief Complaint  Patient presents with   Dysuria    HPI:   31 year old female With pertinent past medical history of renal stones requiring stent placement during hospitalization 02/2021 with development of recurrent complicated urinary tract infection due to retained stent requiring hospitalization 04/2021.  Patient now presenting to [facility] emergency department with complaints of right flank pain.  Patient explains that for approximately the past 3 weeks she has developed generalized malaise.  This generalized malaise was initially mild intensity but progressively became more severe and became associated with severe generalized weakness and poor appetite.  Patient also concurrently developed right flank pain, waxing and waning in quality and sharp.  Initially this pain was mild in intensity but also became more and more severe, now typically additive 10 in intensity, worse with any movement whatsoever and radiating to the mid and lower abdomen occasionally.  Patient also complains of occasional nausea and vomiting.  Patient denies any diarrhea or vaginal discharge.  Patient denies illicit drug use.  Patient's symptoms continued to worsen until she presented to [facility] emergency department for evaluation.  Upon evaluation in the emergency department urinalysis was found to be suggestive of urinary tract infection with concerns for recurrent complicated infection.  CT imaging of the abdomen and pelvis revealed no definitive evidence of infected stone or pyelonephritis.  ER provider discussed case with Dr. Allen Kell who advised treatment of the urinary tract infection with outpatient follow-up with urology for eventual stent removal.  Patient was initiated on intravenous ceftriaxone  and the hospitalist group was then called to assess the patient for admission to the hospital.  Review of Systems: Review of Systems  Constitutional:  Positive for chills and malaise/fatigue.  Gastrointestinal:  Positive for abdominal pain, nausea and vomiting.  Genitourinary:  Positive for dysuria, flank pain and frequency.  Neurological:  Positive for weakness.  All other systems reviewed and are negative.    Past Medical History:  Diagnosis Date   Medical history non-contributory    Polycystic kidney disease     Past Surgical History:  Procedure Laterality Date   CESAREAN SECTION     CESAREAN SECTION N/A 08/09/2012   Procedure: Repeat CESAREAN SECTION of baby boy  at [place] 9/9;  Surgeon: Frederico Hamman, MD;  Location: [facility];  Service: Obstetrics;  Laterality: N/A;   CESAREAN SECTION N/A 08/23/2013   Procedure: CESAREAN SECTION;  Surgeon: Shelly Bombard, MD;  Location: [facility];  Service: Obstetrics;  Laterality: N/A;   CYSTOSCOPY/URETEROSCOPY/HOLMIUM LASER/STENT PLACEMENT Right 03/02/2021   Procedure: CYSTOSCOPY; RIGHT RETROGRADE PYELOGRAM; RIGTH URETERAL STENT PLACEMENT;  Surgeon: Remi Haggard, MD;  Location: [facility];  Service: Urology;  Laterality: Right;    Social History:  reports that she has never smoked. She has never used smokeless tobacco. She reports that she does not drink alcohol and does not use drugs.  No Known Allergies  Family History  Problem Relation Age of Onset   Kidney disease Father    Diabetes Maternal Grandmother    Other Neg Hx     Prior to Admission medications   Medication Sig Start Date End Date Taking? Authorizing Provider  acetaminophen (TYLENOL) 325 MG tablet Take 2 tablets (650 mg total) by mouth every 6 (six) hours as needed for mild  pain or moderate pain. 03/04/21  Yes Arrien, York Ram, MD    Physical Exam:  Vitals:   01/16/22 0445 01/16/22 0515 01/16/22 0530 01/16/22 0730  BP: 114/83 121/86 118/79 111/75  Pulse:  61 61 65 (!) 53  Resp:   16 16  Temp:    98.6 F (37 C)  TempSrc:      SpO2: 98% 99% 98% 100%    Constitutional: Awake alert and oriented x3, patient is in distress due to pain Skin: no rashes, no lesions, poor skin turgor noted. Eyes: Pupils are equally reactive to light.  No evidence of scleral icterus or conjunctival pallor.  ENMT: Notable gingival swelling and tenderness in the upper mouth without obvious fluctuance or drainage.  Dry mucous membranes noted.  Posterior pharynx clear of any exudate or lesions.   Neck: normal, supple, no masses, no thyromegaly.  No evidence of jugular venous distension.   Respiratory: clear to auscultation bilaterally, no wheezing, no crackles. Normal respiratory effort. No accessory muscle use.  Cardiovascular: Regular rate and rhythm, no murmurs / rubs / gallops. No extremity edema. 2+ pedal pulses. No carotid bruits.  Chest:   Nontender without crepitus or deformity.   Back:   Notable right flank tenderness without crepitus or deformity. Abdomen: Notable right-sided abdominal tenderness that is soft.  No evidence of intra-abdominal masses.  Positive bowel sounds noted in all quadrants.   Musculoskeletal: No joint deformity upper and lower extremities. Good ROM, no contractures. Normal muscle tone.  Neurologic: CN 2-12 grossly intact. Sensation intact.  Patient moving all 4 extremities spontaneously.  Patient is following all commands.  Patient is responsive to verbal stimuli.   Psychiatric: Patient exhibits normal mood with appropriate affect.  Patient seems to possess insight as to their current situation.    Data Reviewed:  I have personally reviewed and interpreted labs, imaging.  Significant findings are:  Since revealing positive nitrites, large leukocyte esterase, greater than 50 red blood cells per high-powered field, greater than 50 white blood cells per high-powered field CBC revealing white blood cell count 7.7, hemoglobin 11.9, hematocrit  35.7, platelet count 285    Assessment and Plan: * Complicated UTI (urinary tract infection) Several weeks of increasing malaise weakness and flank pain in the setting of a urinary tract infection complicated by retained stent ED provider initiated intravenous ceftriaxone which will be continued at this time Hydrating patient aggressively with intravenous isotonic fluids Following both blood and urine cultures As needed opiate-based analgesics for associated substantial pain As needed antiemetics for associated nausea EDP discussed case with Dr. Marnee Guarneri with urology who recommended treatment of current infection with outpatient follow-up with urology for eventual stent removal.  Odontogenic infection of jaw Significant gingival tenderness and swelling on exam with complaints of ongoing pain for several weeks. We will obtain CT imaging of the facial bones to determine if this is another focus of infection If there is evidence of significant abscess formation or osteomyelitis will obtain OMFS consultation and adjust antibiotic therapy at that time       Code Status:  Full code  code status decision has been confirmed with: patient Family Communication: deferred   Consults: EDP discussed case with Dr. Marnee Guarneri with urology in the emergency department who recommended outpatient follow-up with urology for eventual stent removal.  Severity of Illness:  The appropriate patient status for this patient is OBSERVATION. Observation status is judged to be reasonable and necessary in order to provide the required intensity of service to ensure  the patient's safety. The patient's presenting symptoms, physical exam findings, and initial radiographic and laboratory data in the context of their medical condition is felt to place them at decreased risk for further clinical deterioration. Furthermore, it is anticipated that the patient will be medically stable for discharge from the hospital within 2  midnights of admission.   Author:  Marinda Elk MD  01/16/2022 9:32 AM

## 2022-01-17 ENCOUNTER — Other Ambulatory Visit (HOSPITAL_COMMUNITY): Payer: Self-pay

## 2022-01-17 ENCOUNTER — Other Ambulatory Visit: Payer: Self-pay

## 2022-01-17 DIAGNOSIS — Z841 Family history of disorders of kidney and ureter: Secondary | ICD-10-CM | POA: Diagnosis not present

## 2022-01-17 DIAGNOSIS — J320 Chronic maxillary sinusitis: Secondary | ICD-10-CM | POA: Diagnosis present

## 2022-01-17 DIAGNOSIS — Q613 Polycystic kidney, unspecified: Secondary | ICD-10-CM | POA: Diagnosis not present

## 2022-01-17 DIAGNOSIS — E876 Hypokalemia: Secondary | ICD-10-CM | POA: Diagnosis present

## 2022-01-17 DIAGNOSIS — N12 Tubulo-interstitial nephritis, not specified as acute or chronic: Secondary | ICD-10-CM | POA: Diagnosis present

## 2022-01-17 DIAGNOSIS — K011 Impacted teeth: Secondary | ICD-10-CM | POA: Diagnosis present

## 2022-01-17 DIAGNOSIS — Z87440 Personal history of urinary (tract) infections: Secondary | ICD-10-CM | POA: Diagnosis not present

## 2022-01-17 DIAGNOSIS — K029 Dental caries, unspecified: Secondary | ICD-10-CM | POA: Diagnosis present

## 2022-01-17 DIAGNOSIS — D631 Anemia in chronic kidney disease: Secondary | ICD-10-CM | POA: Diagnosis present

## 2022-01-17 DIAGNOSIS — M272 Inflammatory conditions of jaws: Secondary | ICD-10-CM | POA: Diagnosis present

## 2022-01-17 DIAGNOSIS — Z87442 Personal history of urinary calculi: Secondary | ICD-10-CM | POA: Diagnosis not present

## 2022-01-17 DIAGNOSIS — N390 Urinary tract infection, site not specified: Secondary | ICD-10-CM | POA: Diagnosis not present

## 2022-01-17 LAB — CBC WITH DIFFERENTIAL/PLATELET
Abs Immature Granulocytes: 0.01 10*3/uL (ref 0.00–0.07)
Basophils Absolute: 0 10*3/uL (ref 0.0–0.1)
Basophils Relative: 1 %
Eosinophils Absolute: 0.1 10*3/uL (ref 0.0–0.5)
Eosinophils Relative: 2 %
HCT: 28.9 % — ABNORMAL LOW (ref 36.0–46.0)
Hemoglobin: 9.7 g/dL — ABNORMAL LOW (ref 12.0–15.0)
Immature Granulocytes: 0 %
Lymphocytes Relative: 37 %
Lymphs Abs: 1.8 10*3/uL (ref 0.7–4.0)
MCH: 32 pg (ref 26.0–34.0)
MCHC: 33.6 g/dL (ref 30.0–36.0)
MCV: 95.4 fL (ref 80.0–100.0)
Monocytes Absolute: 0.3 10*3/uL (ref 0.1–1.0)
Monocytes Relative: 6 %
Neutro Abs: 2.5 10*3/uL (ref 1.7–7.7)
Neutrophils Relative %: 54 %
Platelets: 209 10*3/uL (ref 150–400)
RBC: 3.03 MIL/uL — ABNORMAL LOW (ref 3.87–5.11)
RDW: 12 % (ref 11.5–15.5)
WBC: 4.7 10*3/uL (ref 4.0–10.5)
nRBC: 0 % (ref 0.0–0.2)

## 2022-01-17 LAB — COMPREHENSIVE METABOLIC PANEL
ALT: 9 U/L (ref 0–44)
AST: 15 U/L (ref 15–41)
Albumin: 3.2 g/dL — ABNORMAL LOW (ref 3.5–5.0)
Alkaline Phosphatase: 27 U/L — ABNORMAL LOW (ref 38–126)
Anion gap: 4 — ABNORMAL LOW (ref 5–15)
BUN: 12 mg/dL (ref 6–20)
CO2: 24 mmol/L (ref 22–32)
Calcium: 8.4 mg/dL — ABNORMAL LOW (ref 8.9–10.3)
Chloride: 110 mmol/L (ref 98–111)
Creatinine, Ser: 0.68 mg/dL (ref 0.44–1.00)
GFR, Estimated: >60 mL/min (ref >60)
Glucose, Bld: 103 mg/dL — ABNORMAL HIGH (ref 70–99)
Potassium: 3.2 mmol/L — ABNORMAL LOW (ref 3.5–5.1)
Sodium: 138 mmol/L (ref 135–145)
Total Bilirubin: 0.5 mg/dL (ref 0.3–1.2)
Total Protein: 5.6 g/dL — ABNORMAL LOW (ref 6.5–8.1)

## 2022-01-17 LAB — MAGNESIUM: Magnesium: 1.6 mg/dL — ABNORMAL LOW (ref 1.7–2.4)

## 2022-01-17 MED ORDER — AMOXICILLIN-POT CLAVULANATE 875-125 MG PO TABS
1.0000 | ORAL_TABLET | Freq: Two times a day (BID) | ORAL | 0 refills | Status: DC
  Filled 2022-01-17: qty 10, 5d supply, fill #0

## 2022-01-17 MED ORDER — POLYETHYLENE GLYCOL 3350 17 GM/SCOOP PO POWD
17.0000 g | Freq: Every day | ORAL | 0 refills | Status: DC | PRN
  Filled 2022-01-17: qty 238, 14d supply, fill #0

## 2022-01-17 MED ORDER — AMOXICILLIN-POT CLAVULANATE 875-125 MG PO TABS
1.0000 | ORAL_TABLET | Freq: Two times a day (BID) | ORAL | Status: DC
Start: 2022-01-18 — End: 2022-01-18
  Administered 2022-01-18: 1 via ORAL
  Filled 2022-01-17: qty 1

## 2022-01-17 MED ORDER — SODIUM CHLORIDE 0.9 % IV SOLN
3.0000 g | Freq: Four times a day (QID) | INTRAVENOUS | Status: AC
  Administered 2022-01-17 (×2): 3 g via INTRAVENOUS
  Filled 2022-01-17 (×2): qty 8

## 2022-01-17 MED ORDER — BISACODYL 10 MG RE SUPP
10.0000 mg | Freq: Once | RECTAL | Status: AC
Start: 2022-01-17 — End: 2022-01-17
  Administered 2022-01-17: 10 mg via RECTAL
  Filled 2022-01-17: qty 1

## 2022-01-17 MED ORDER — MAGNESIUM SULFATE 2 GM/50ML IV SOLN
2.0000 g | Freq: Once | INTRAVENOUS | Status: AC
  Administered 2022-01-17: 2 g via INTRAVENOUS
  Filled 2022-01-17: qty 50

## 2022-01-17 MED ORDER — ENSURE ENLIVE PO LIQD
237.0000 mL | Freq: Two times a day (BID) | ORAL | Status: DC
  Administered 2022-01-17 – 2022-01-18 (×2): 237 mL via ORAL

## 2022-01-17 MED ORDER — POTASSIUM CHLORIDE CRYS ER 20 MEQ PO TBCR
40.0000 meq | EXTENDED_RELEASE_TABLET | ORAL | Status: AC
  Administered 2022-01-17 (×2): 40 meq via ORAL
  Filled 2022-01-17 (×2): qty 2

## 2022-01-17 NOTE — Hospital Course (Signed)
PMH of nephrolithiasis SP stent placement and recurrent UTI presented to hospital with bilateral flank pain in the setting of complicated UTI versus pain related to polycystic kidney disease. Currently on antibiotics

## 2022-01-17 NOTE — Progress Notes (Addendum)
  Progress Note Patient: Colleen Oneill GBT:[id number] DOB: Mar 05, 1991 DOA: 01/15/2022  DOS: the patient was seen and examined on 01/17/2022  Brief hospital course: PMH of nephrolithiasis SP stent placement and recurrent UTI presented to hospital with bilateral flank pain in the setting of complicated UTI versus pain related to polycystic kidney disease. Currently on antibiotics Assessment and Plan: Complicated UTI. Presents with generalized weakness, malaise, bilateral flank pain right worse than left. Possible UTI in the setting of retained stent. Urine culture grows multiple species. EDP discussed with urology who recommended treatment of infection and outpatient follow-up for stent removal. Blood cultures so far negative. Continue supportive care with pain control and antiemetics. Discontinue IV fluids.  Dental caries Odontogenic maxillary sinusitis Recommend patient to follow-up with dentistry outpatient but Antibiotic initially was ceftriaxone but now on Unasyn. Plan discharge home on oral Augmentin  Hypokalemia, hypomagnesemia. Replaced.  Anemia of chronic kidney disease. Baseline hemoglobin of around 8.7.  On presentation hemoglobin 11.9.  Now worsening to 9.7.  Most likely dilutional drop.  Monitor.  Polycystic kidney disease Per up-to-date, "chronic kidney pain is more common in advanced disease in patients who have enlarged kidneys. It is often dull and persistent and is thought to reflect either stretching of the capsule or traction on the renal pedicle " Going forward would recommend patient to be more selective with the need for antibiotics in order to avoid development for resistance.  Subjective: Pain improving.  No nausea or vomiting.  No headache.  Physical Exam: Vitals:   01/16/22 2021 01/17/22 0303 01/17/22 1450 01/17/22 1730  BP: 123/72 123/72 115/73 107/71  Pulse: 64 64 67 64  Resp: 18 18 16 16   Temp: 98.5 F (36.9 C) 98.5 F (36.9 C) 98.5 F (36.9 C) 99.1  F (37.3 C)  TempSrc: Oral Oral Oral Oral  SpO2: 100%  100% 100%  Weight:  59 kg    Height:  5\' 7"  (1.702 m)     General: Appear in mild distress; no visible Abnormal Neck Mass Or lumps, Conjunctiva normal Cardiovascular: S1 and S2 Present, no Murmur, Respiratory: good respiratory effort, Bilateral Air entry present and CTA, no Crackles, no wheezes Abdomen: Bowel Sound present, right sided tenderness. Extremities: no Pedal edema Neurology: alert and oriented to time, place, and person  Gait not checked due to patient safety concerns   Data Reviewed: I have Reviewed nursing notes, Vitals, and Lab results since pt's last encounter. Pertinent lab results urine culture I have ordered test including CBC and BMP    Family Communication: None at bedside  Disposition: Status is: Inpatient Remains inpatient appropriate because: Continue IV antibiotics.  Waiting for culture clearance.  Author: , MD 01/17/2022 6:21 PM  Please look on [email or web address] to find out who is on call.

## 2022-01-17 NOTE — TOC Initial Note (Signed)
Transition of Care [facility]) - Initial/Assessment Note    Patient Details  Name: Colleen Oneill MRN: [id number] Date of Birth: [birth year]  Transition of Care [facility]) CM/SW Contact:    Tom-Johnson, Hershal Coria, RN Phone Number: 01/17/2022, 1:03 PM  Clinical Narrative:                  CM spoke with patient at bedside about needs for post hospital transition. Admitted for Complicated UTI. Patient lives with boyfriend at [facility]. Has four children Both parents and two brothers are supportive as needed. Currently employed at [employer]. Able to drive but car is not functional, boyfriend drives to appointments if needed.  PCP is [place], Lexine Baton, NP and uses [facility] on [address].  Boyfriend to transport at discharge. CM will continue to follow with needs.   Expected Discharge Plan: Home/Self Care Barriers to Discharge: Continued Medical Work up   Patient Goals and CMS Choice Patient states their goals for this hospitalization and ongoing recovery are:: To return to [facility].gov Compare Post Acute Care list provided to:: Patient Choice offered to / list presented to : NA  Expected Discharge Plan and Services Expected Discharge Plan: Home/Self Care   Discharge Planning Services: CM Consult Post Acute Care Choice: NA Living arrangements for the past 2 months: Hotel/Motel                 DME Arranged: N/A DME Agency: NA       HH Arranged: NA HH Agency: NA        Prior Living Arrangements/Services Living arrangements for the past 2 months: Hotel/Motel Lives with:: Self Patient language and need for interpreter reviewed:: Yes Do you feel safe going back to the place where you live?: Yes      Need for Family Participation in Patient Care: Yes (Comment) Care giver support system in place?: Yes (comment)   Criminal Activity/Legal Involvement Pertinent to Current Situation/Hospitalization: No - Comment as needed  Activities of Daily Living Home  Assistive Devices/Equipment: None ADL Screening (condition at time of admission) Patient's cognitive ability adequate to safely complete daily activities?: Yes Is the patient deaf or have difficulty hearing?: No Does the patient have difficulty seeing, even when wearing glasses/contacts?: No Does the patient have difficulty concentrating, remembering, or making decisions?: No Patient able to express need for assistance with ADLs?: Yes Does the patient have difficulty dressing or bathing?: No Independently performs ADLs?: Yes (appropriate for developmental age) Does the patient have difficulty walking or climbing stairs?: No Weakness of Legs: None Weakness of Arms/Hands: None  Permission Sought/Granted Permission sought to share information with : Case Manager, Family Supports Permission granted to share information with : Yes, Verbal Permission Granted              Emotional Assessment Appearance:: Appears stated age Attitude/Demeanor/Rapport: Engaged, Gracious Affect (typically observed): Accepting, Appropriate, Calm, Hopeful Orientation: : Oriented to Self, Oriented to Place, Oriented to  Time, Oriented to Situation Alcohol / Substance Use: Not Applicable Psych Involvement: No (comment)  Admission diagnosis:  Pyelonephritis [N12] Acute pyelonephritis [N10] Patient Active Problem List   Diagnosis Date Noted   Odontogenic infection of jaw 01/16/2022   Nausea and vomiting 04/27/2021   Pyelonephritis 03/02/2021   Sepsis (HCC) 03/01/2021   Complicated UTI (urinary tract infection) 03/01/2021   Polycystic kidney disease 03/01/2021   Anemia, chronic disease 03/01/2021   Premature uterine contractions 08/23/2013   Fetal heart rate decelerations, delivered 08/23/2013  Nausea and vomiting in pregnancy prior to [redacted] weeks gestation 03/15/2013   PCP:  Kathrynn Speed, NP Pharmacy:   [facility] [address] ([address]), [place] - [address] [place] ([place]) [place] [address] Phone: [phone] Fax: [phone]  [facility] at [facility] [address] [place] [address] Phone: [phone] Fax: [phone]  [facility] [address] [place] [address] Phone: [phone] Fax: [phone]     Social Determinants of Health (SDOH) Interventions    Readmission Risk Interventions     No data to display

## 2022-01-17 NOTE — Plan of Care (Signed)

## 2022-01-18 DIAGNOSIS — N390 Urinary tract infection, site not specified: Secondary | ICD-10-CM | POA: Diagnosis not present

## 2022-01-18 MED ORDER — AMOXICILLIN-POT CLAVULANATE 875-125 MG PO TABS: 1.0000 | ORAL_TABLET | Freq: Two times a day (BID) | ORAL | 0 refills | Status: AC

## 2022-01-18 NOTE — Discharge Summary (Signed)
Physician Discharge Summary   Patient: Colleen Oneill MRN: [id number] DOB: 02-13-1991  Admit date:     01/15/2022  Discharge date: 01/18/2022  Discharge Physician: Lynden Oxford  PCP: Orion Crook I, NP  Recommendations at discharge: Follow-up with urology as recommended.  Discharge Diagnoses: Principal Problem:   Complicated UTI (urinary tract infection) Active Problems:   Odontogenic infection of jaw   Pyelonephritis  Hospital Course: PMH of nephrolithiasis SP stent placement and recurrent UTI presented to hospital with bilateral flank pain in the setting of complicated UTI versus pain related to polycystic kidney disease. Currently on antibiotics  Assessment and Plan: Complicated UTI. Presents with generalized weakness, malaise, bilateral flank pain right worse than left. Possible UTI in the setting of retained stent. Urine culture grows multiple species. EDP discussed with urology who recommended treatment of infection and outpatient follow-up for stent removal. Blood cultures so far negative. Continue supportive care with pain control and antiemetics. Discontinue IV fluids.   Dental caries Odontogenic maxillary sinusitis Recommend patient to follow-up with dentistry outpatient but Antibiotic initially was ceftriaxone but now on Unasyn. Plan discharge home on oral Augmentin   Hypokalemia, hypomagnesemia. Replaced.   Anemia of chronic kidney disease. Baseline hemoglobin of around 8.7.  On presentation hemoglobin 11.9.  Now worsening to 9.7.  Most likely dilutional drop.  Monitor.   Polycystic kidney disease Per up-to-date, "chronic kidney pain is more common in advanced disease in patients who have enlarged kidneys. It is often dull and persistent and is thought to reflect either stretching of the capsule or traction on the renal pedicle " Going forward would recommend patient to be more selective with the need for antibiotics in order to avoid development for  resistance.  Consultants: none Procedures performed:  none DISCHARGE MEDICATION: Allergies as of 01/18/2022   No Known Allergies      Medication List     TAKE these medications    acetaminophen 325 MG tablet Commonly known as: TYLENOL Take 2 tablets (650 mg total) by mouth every 6 (six) hours as needed for mild pain or moderate pain.   amoxicillin-clavulanate 875-125 MG tablet Commonly known as: AUGMENTIN Take 1 tablet by mouth 2 (two) times daily for 5 days.   polyethylene glycol powder 17 GM/SCOOP powder Commonly known as: GLYCOLAX/MIRALAX Take 1 capful (17 g) with water by mouth daily as needed for mild constipation.        Follow-up Information     Orion Crook I, NP. Schedule an appointment as soon as possible for a visit in 2 days.   Specialty: Nurse Practitioner Why: Follow up from ER visit Contact information: [address] [place] [address] [phone]         [facility] EMERGENCY DEPARTMENT. Go to .   Specialty: Emergency Medicine Why: As needed, If symptoms worsen Contact information: [address] [id number] mc [place] [address] [phone]        [facility]. Schedule an appointment as soon as possible for a visit in 1 week.   Why: Follow up from ER visit Contact information: [address] [place] [address] [place] [address] [phone]               Disposition: Home Diet recommendation: Regular diet  Discharge Exam: Vitals:   01/17/22 2107 01/18/22 0447 01/18/22 0458 01/18/22 0836  BP: 117/83 111/69  103/69  Pulse: 69 64  60  Resp: 16 16  16   Temp: 99 F (37.2  C) 98.7 F (37.1 C)  98.5 F (36.9 C)  TempSrc: Oral Oral  Oral  SpO2: 100% 100%  100%  Weight:   61.7 kg   Height:   5\' 7"  (1.702 m)    General: Appear in no distress; no visible Abnormal Neck Mass Or lumps, Conjunctiva normal Cardiovascular: S1 and S2 Present, no  Murmur, Respiratory: good respiratory effort, Bilateral Air entry present and CTA, no Crackles, no wheezes Abdomen: Bowel Sound present,  Extremities: no Pedal edema Neurology: alert and oriented to time, place, and person  Gait not checked due to patient safety concerns Filed Weights   01/17/22 0303 01/18/22 0458  Weight: 59 kg 61.7 kg   Condition at discharge: stable  The results of significant diagnostics from this hospitalization (including imaging, microbiology, ancillary and laboratory) are listed below for reference.   Imaging Studies: CT MAXILLOFACIAL W CONTRAST  Result Date: 01/16/2022 CLINICAL DATA:  Maxillary/facial abscess EXAM: CT MAXILLOFACIAL WITH CONTRAST TECHNIQUE: Multidetector CT imaging of the maxillofacial structures was performed with intravenous contrast. Multiplanar CT image reconstructions were also generated. RADIATION DOSE REDUCTION: This exam was performed according to the departmental dose-optimization program which includes automated exposure control, adjustment of the mA and/or kV according to patient size and/or use of iterative reconstruction technique. CONTRAST:  35mL OMNIPAQUE IOHEXOL 300 MG/ML  SOLN COMPARISON:  None Available. FINDINGS: Osseous: No fracture or mandibular dislocation. Dental findings described below. Orbits: Negative. No traumatic or inflammatory finding. Sinuses: Inferior right maxillary sinus mucosal thickening Soft tissues: Asymmetric soft tissue thickening and mild edema overlying the posterior aspect of the right maxilla. Adjacent carious posterior-most right maxillary molar with overlying periapical lucency. Limited intracranial: No significant or unexpected finding. IMPRESSION: 1. Asymmetric soft tissue thickening and mild edema overlying the posterior right maxilla, probably infectious cellulitis/phlegmon given adjacent carious posterior-most right maxillary molar with periapical lucency. No discrete, drainable fluid collection. 2.  Inferior right maxillary sinus mucosal thickening, probably odontogenic given the above findings. Electronically Signed   By: 72m M.D.   On: 01/16/2022 08:30   CT Renal Stone Study  Result Date: 01/16/2022 CLINICAL DATA:  Flank pain, dysuria. History of nephrolithiasis. Polycystic kidney disease. EXAM: CT ABDOMEN AND PELVIS WITHOUT CONTRAST TECHNIQUE: Multidetector CT imaging of the abdomen and pelvis was performed following the standard protocol without IV contrast. RADIATION DOSE REDUCTION: This exam was performed according to the departmental dose-optimization program which includes automated exposure control, adjustment of the mA and/or kV according to patient size and/or use of iterative reconstruction technique. COMPARISON:  04/27/2021 FINDINGS: Lower chest: Lung bases are clear. Hepatobiliary: Scattered small hepatic cysts, benign. Layering gallbladder sludge versus noncalcified gallstones (series 3/image 30), without associated inflammatory changes. No intrahepatic or extrahepatic duct dilatation. Pancreas: Within normal limits. Spleen: Within normal limits. Adrenals/Urinary Tract: Adrenal glands are within normal limits. Numerous bilateral renal cysts of varying sizes and complexity, compatible the patient's known polycystic kidney disease, poorly evaluated on unenhanced CT. Three nonobstructing left renal calculi measuring up to 2 mm in the left upper pole (series 3/image 30). Right double-pigtail ureteral stent in satisfactory position. No hydronephrosis. Bladder is underdistended but unremarkable. Stomach/Bowel: Stomach is within normal limits. No evidence of bowel obstruction. Normal appendix (series 3/image 7). No colonic wall thickening or inflammatory changes. Vascular/Lymphatic: No evidence of abdominal aortic aneurysm. No suspicious abdominopelvic lymphadenopathy. Reproductive: Uterus is within normal limits. Bilateral ovaries are within normal limits. Other: No abdominopelvic  ascites. Musculoskeletal: Visualized osseous structures are within normal limits. IMPRESSION: Right double-pigtail ureteral stent  in satisfactory position. No hydronephrosis. Three nonobstructing left renal calculi measuring up to 2 mm in the left upper pole. Numerous bilateral renal cysts of varying sizes and complexity, compatible with patient's known polycystic kidney disease, poorly evaluated on unenhanced CT. Electronically Signed   By: Charline Bills M.D.   On: 01/16/2022 02:17    Microbiology: Results for orders placed or performed during the hospital encounter of 01/15/22  Urine Culture     Status: Abnormal   Collection Time: 01/15/22  9:26 PM   Specimen: Urine, Clean Catch  Result Value Ref Range Status   Specimen Description URINE, CLEAN CATCH  Final   Special Requests   Final    NONE Performed at [facility], [address] [place], [place] [address]    Culture MULTIPLE SPECIES PRESENT, SUGGEST RECOLLECTION (A)  Final   Report Status 01/16/2022 FINAL  Final  Culture, blood (Routine X 2) w Reflex to ID Panel     Status: None (Preliminary result)   Collection Time: 01/16/22 11:29 AM   Specimen: BLOOD LEFT HAND  Result Value Ref Range Status   Specimen Description BLOOD LEFT HAND  Final   Special Requests   Final    BOTTLES DRAWN AEROBIC AND ANAEROBIC Blood Culture adequate volume   Culture   Final    NO GROWTH 2 DAYS Performed at [facility], [address], [place], [place] [address]    Report Status PENDING  Incomplete    Labs: CBC: Recent Labs  Lab 01/15/22 2307 01/17/22 0502  WBC 7.7 4.7  NEUTROABS  --  2.5  HGB 11.9* 9.7*  HCT 35.7* 28.9*  MCV 95.2 95.4  PLT 285 209   Basic Metabolic Panel: Recent Labs  Lab 01/15/22 2307 01/17/22 0502  NA 139 138  K 3.6 3.2*  CL 105 110  CO2 23 24  GLUCOSE 89 103*  BUN 21* 12  CREATININE 0.74 0.68  CALCIUM 9.8 8.4*  MG  --  1.6*   Liver Function Tests: Recent Labs  Lab 01/17/22 0502  AST 15   ALT 9  ALKPHOS 27*  BILITOT 0.5  PROT 5.6*  ALBUMIN 3.2*   CBG: No results for input(s): "GLUCAP" in the last 168 hours.  Discharge time spent: greater than 30 minutes.  Signed: Lynden Oxford, MD [facility] 01/18/2022

## 2022-01-21 LAB — CULTURE, BLOOD (ROUTINE X 2)
Culture: NO GROWTH
Special Requests: ADEQUATE

## 2022-11-16 ENCOUNTER — Emergency Department (HOSPITAL_COMMUNITY)
Admission: EM | Admit: 2022-11-16 | Discharge: 2022-11-17 | Disposition: A | Discharge status: Home / Self Care | Payer: Self-pay | Attending: Emergency Medicine | Admitting: Emergency Medicine

## 2022-11-16 ENCOUNTER — Other Ambulatory Visit: Payer: Self-pay

## 2022-11-16 ENCOUNTER — Encounter (HOSPITAL_COMMUNITY): Payer: Self-pay

## 2022-11-16 DIAGNOSIS — N12 Tubulo-interstitial nephritis, not specified as acute or chronic: Secondary | ICD-10-CM | POA: Insufficient documentation

## 2022-11-16 LAB — COMPREHENSIVE METABOLIC PANEL
ALT: 11 U/L (ref 0–44)
AST: 16 U/L (ref 15–41)
Albumin: 4 g/dL (ref 3.5–5.0)
Alkaline Phosphatase: 33 U/L — ABNORMAL LOW (ref 38–126)
Anion gap: 9 (ref 5–15)
BUN: 21 mg/dL — ABNORMAL HIGH (ref 6–20)
CO2: 24 mmol/L (ref 22–32)
Calcium: 9.3 mg/dL (ref 8.9–10.3)
Chloride: 105 mmol/L (ref 98–111)
Creatinine, Ser: 0.83 mg/dL (ref 0.44–1.00)
GFR, Estimated: >60 mL/min (ref >60)
Glucose, Bld: 115 mg/dL — ABNORMAL HIGH (ref 70–99)
Potassium: 3.6 mmol/L (ref 3.5–5.1)
Sodium: 138 mmol/L (ref 135–145)
Total Bilirubin: 0.8 mg/dL (ref 0.3–1.2)
Total Protein: 6.9 g/dL (ref 6.5–8.1)

## 2022-11-16 LAB — URINALYSIS, ROUTINE W REFLEX MICROSCOPIC
Bilirubin Urine: NEGATIVE
Glucose, UA: NEGATIVE mg/dL
Ketones, ur: NEGATIVE mg/dL
Nitrite: NEGATIVE
Protein, ur: 100 mg/dL — AB
RBC / HPF: >50 RBC/hpf (ref 0–5)
Specific Gravity, Urine: 1.016 (ref 1.005–1.030)
WBC, UA: >50 WBC/hpf (ref 0–5)
pH: 5 (ref 5.0–8.0)

## 2022-11-16 LAB — CBC
HCT: 34.8 % — ABNORMAL LOW (ref 36.0–46.0)
Hemoglobin: 11.5 g/dL — ABNORMAL LOW (ref 12.0–15.0)
MCH: 31.9 pg (ref 26.0–34.0)
MCHC: 33 g/dL (ref 30.0–36.0)
MCV: 96.4 fL (ref 80.0–100.0)
Platelets: 272 10*3/uL (ref 150–400)
RBC: 3.61 MIL/uL — ABNORMAL LOW (ref 3.87–5.11)
RDW: 11.9 % (ref 11.5–15.5)
WBC: 6.5 10*3/uL (ref 4.0–10.5)
nRBC: 0 % (ref 0.0–0.2)

## 2022-11-16 LAB — LIPASE, BLOOD: Lipase: 29 U/L (ref 11–51)

## 2022-11-16 LAB — I-STAT BETA HCG BLOOD, ED (MC, WL, AP ONLY): I-stat hCG, quantitative: <5 m[IU]/mL (ref <5)

## 2022-11-16 MED ORDER — ONDANSETRON HCL 4 MG/2ML IJ SOLN
4.0000 mg | Freq: Once | INTRAMUSCULAR | Status: AC
  Administered 2022-11-17: 4 mg via INTRAVENOUS
  Filled 2022-11-16: qty 2

## 2022-11-16 MED ORDER — SODIUM CHLORIDE 0.9 % IV BOLUS
1000.0000 mL | Freq: Once | INTRAVENOUS | Status: AC
  Administered 2022-11-17: 1000 mL via INTRAVENOUS

## 2022-11-16 MED ORDER — MORPHINE SULFATE (PF) 4 MG/ML IV SOLN
4.0000 mg | Freq: Once | INTRAVENOUS | Status: AC
  Administered 2022-11-17: 4 mg via INTRAVENOUS
  Filled 2022-11-16: qty 1

## 2022-11-16 MED ORDER — SODIUM CHLORIDE 0.9 % IV SOLN
1.0000 g | Freq: Once | INTRAVENOUS | Status: AC
  Administered 2022-11-17: 1 g via INTRAVENOUS
  Filled 2022-11-16: qty 10

## 2022-11-16 NOTE — ED Triage Notes (Signed)
Pt reports RLQ and LLQ abd pain that started about a month ago and is getting worse. She reports dysuria. Denies n/v/d. LBM 3-4 days ago. LMP-2 weeks ago.

## 2022-11-17 ENCOUNTER — Emergency Department (HOSPITAL_COMMUNITY): Payer: Self-pay

## 2022-11-17 MED ORDER — CEPHALEXIN 500 MG PO CAPS: 500.0000 mg | ORAL_CAPSULE | Freq: Four times a day (QID) | ORAL | 0 refills | Status: AC

## 2022-11-17 MED ORDER — HYDROCODONE-ACETAMINOPHEN 5-325 MG PO TABS: 2.0000 | ORAL_TABLET | ORAL | 0 refills | Status: DC | PRN

## 2022-11-17 MED ORDER — IOHEXOL 350 MG/ML SOLN
75.0000 mL | Freq: Once | INTRAVENOUS | Status: AC | PRN
  Administered 2022-11-17: 75 mL via INTRAVENOUS

## 2022-11-17 NOTE — ED Provider Notes (Signed)
[facility] DEPT [facility] Emergency Department Provider Note MRN:  [id number]  Arrival date & time: 11/17/22     Chief Complaint   Abdominal Pain   History of Present Illness   Colleen Oneill is a 32 y.o. year-old female with a history of polycystic kidney disease presenting to the ED with chief complaint of abdominal pain.  Diffuse abdominal pain including bilateral flanks for about a month.  Constant pain with occasional episodes of more significant pain.  Denies fever.  Also has pain in the suprapubic region with urinating.  No vomiting or diarrhea, some occasional mild nausea.  Had a similar episode last year.  Review of Systems  A thorough review of systems was obtained and all systems are negative except as noted in the HPI and PMH.   Patient's Health History    Past Medical History:  Diagnosis Date   Medical history non-contributory    Polycystic kidney disease     Past Surgical History:  Procedure Laterality Date   CESAREAN SECTION     CESAREAN SECTION N/A 08/09/2012   Procedure: Repeat CESAREAN SECTION of baby boy  at 0431  APGAR 9/9;  Surgeon: Kathreen Cosier, MD;  Location: [facility];  Service: Obstetrics;  Laterality: N/A;   CESAREAN SECTION N/A 08/23/2013   Procedure: CESAREAN SECTION;  Surgeon: Brock Bad, MD;  Location: [facility];  Service: Obstetrics;  Laterality: N/A;   CYSTOSCOPY/URETEROSCOPY/HOLMIUM LASER/STENT PLACEMENT Right 03/02/2021   Procedure: CYSTOSCOPY; RIGHT RETROGRADE PYELOGRAM; RIGTH URETERAL STENT PLACEMENT;  Surgeon: Belva Agee, MD;  Location: [facility];  Service: Urology;  Laterality: Right;    Family History  Problem Relation Age of Onset   Kidney disease Father    Diabetes Maternal Grandmother    Other Neg Hx     Social History   Socioeconomic History   Marital status: Single    Spouse name: Not on file   Number of children: Not on file   Years of education: Not on file   Highest education level: Not on file   Occupational History   Not on file  Tobacco Use   Smoking status: Never   Smokeless tobacco: Never  Substance and Sexual Activity   Alcohol use: No   Drug use: No   Sexual activity: Not Currently    Birth control/protection: None  Other Topics Concern   Not on file  Social History Narrative   Not on file   Social Determinants of Health   Financial Resource Strain: Not on file  Food Insecurity: Not on file  Transportation Needs: Not on file  Physical Activity: Not on file  Stress: Not on file  Social Connections: Not on file  Intimate Partner Violence: Not on file     Physical Exam   Vitals:   11/17/22 0115 11/17/22 0130  BP: 104/77 119/79  Pulse: (!) 56 (!) 56  Resp: 19 10  Temp:  98.6 F (37 C)  SpO2: 100% 100%    CONSTITUTIONAL: Well-appearing, NAD NEURO/PSYCH:  Alert and oriented x 3, no focal deficits EYES:  eyes equal and reactive ENT/NECK:  no LAD, no JVD CARDIO: Regular rate, well-perfused, normal S1 and S2 PULM:  CTAB no wheezing or rhonchi GI/GU:  non-distended, non-tender MSK/SPINE:  No gross deformities, no edema SKIN:  no rash, atraumatic   *Additional and/or pertinent findings included in MDM below  Diagnostic and Interventional Summary    EKG Interpretation  Date/Time:    Ventricular Rate:    PR Interval:  QRS Duration:   QT Interval:    QTC Calculation:   R Axis:     Text Interpretation:         Labs Reviewed  COMPREHENSIVE METABOLIC PANEL - Abnormal; Notable for the following components:      Result Value   Glucose, Bld 115 (*)    BUN 21 (*)    Alkaline Phosphatase 33 (*)    All other components within normal limits  CBC - Abnormal; Notable for the following components:   RBC 3.61 (*)    Hemoglobin 11.5 (*)    HCT 34.8 (*)    All other components within normal limits  URINALYSIS, ROUTINE W REFLEX MICROSCOPIC - Abnormal; Notable for the following components:   APPearance CLOUDY (*)    Hgb urine dipstick MODERATE (*)     Protein, ur 100 (*)    Leukocytes,Ua LARGE (*)    Bacteria, UA MANY (*)    Non Squamous Epithelial 0-5 (*)    All other components within normal limits  LIPASE, BLOOD  I-STAT BETA HCG BLOOD, ED ([facility], [facility], [facility] ONLY)    CT ABDOMEN PELVIS W CONTRAST  Final Result      Medications  ondansetron (ZOFRAN) injection 4 mg (4 mg Intravenous Given 11/17/22 0002)  morphine (PF) 4 MG/ML injection 4 mg (4 mg Intravenous Given 11/17/22 0003)  cefTRIAXone (ROCEPHIN) 1 g in sodium chloride 0.9 % 100 mL IVPB (0 g Intravenous Stopped 11/17/22 0107)  sodium chloride 0.9 % bolus 1,000 mL (0 mLs Intravenous Stopped 11/17/22 0133)  iohexol (OMNIPAQUE) 350 MG/ML injection 75 mL (75 mLs Intravenous Contrast Given 11/17/22 0054)     Procedures  /  Critical Care Procedures  ED Course and Medical Decision Making  Initial Impression and Ddx Differential diagnosis includes pyelonephritis, UTI, symptomatic polycystic kidney disease, diverticulitis, appendicitis, colitis  Past medical/surgical history that increases complexity of ED encounter: Polycystic kidney disease  Interpretation of Diagnostics I personally reviewed the laboratory assessment and my interpretation is as follows: No significant blood count or electrolyte disturbance.  Urinalysis with some evidence to suggest infection.    Patient Reassessment and Ultimate Disposition/Management     CT largely unremarkable.  Patient looks well, normal vitals, appropriate for discharge on antibiotics.  Patient management required discussion with the following services or consulting groups:  None  Complexity of Problems Addressed Acute illness or injury that poses threat of life of bodily function  Additional Data Reviewed and Analyzed Further history obtained from: Prior ED visit notes and Prior labs/imaging results  Additional Factors Impacting ED Encounter Risk Prescriptions  Elmer Sow. Pilar Plate, MD [facility] Emergency Medicine [facility]  Health [email or web address]  Final Clinical Impressions(s) / ED Diagnoses     ICD-10-CM   1. Pyelonephritis  N12       ED Discharge Orders          Ordered    cephALEXin (KEFLEX) 500 MG capsule  4 times daily        11/17/22 0209    HYDROcodone-acetaminophen (NORCO/VICODIN) 5-325 MG tablet  Every 4 hours PRN        11/17/22 0209             Discharge Instructions Discussed with and Provided to Patient:     Discharge Instructions      You were evaluated in the Emergency Department and after careful evaluation, we did not find any emergent condition requiring admission or further testing in the hospital.  Your exam/testing today was overall  reassuring.  Symptoms seem to be due to pyelonephritis or a kidney infection.  Likely spread from a urinary tract infection.  Please take the Keflex antibiotic as directed.  Recommend Tylenol at home for pain, 1000 mg every 4-6 hours.  Can use the Norco for more significant pain.  Please return to the Emergency Department if you experience any worsening of your condition.  Thank you for allowing [place] to be a part of your care.        Sabas Sous, MD 11/17/22 [phone]

## 2022-11-17 NOTE — ED Notes (Signed)
Pt received for care at 0125.  Quietly resting in bed; respirations even and unlabored.  Bed in lowest position, wheels locked.  Call bell within reach.

## 2022-11-17 NOTE — Discharge Instructions (Signed)
You were evaluated in the Emergency Department and after careful evaluation, we did not find any emergent condition requiring admission or further testing in the hospital.  Your exam/testing today was overall reassuring.  Symptoms seem to be due to pyelonephritis or a kidney infection.  Likely spread from a urinary tract infection.  Please take the Keflex antibiotic as directed.  Recommend Tylenol at home for pain, 1000 mg every 4-6 hours.  Can use the Norco for more significant pain.  Please return to the Emergency Department if you experience any worsening of your condition.  Thank you for allowing [place] to be a part of your care.

## 2022-11-17 NOTE — ED Notes (Signed)
Pt provided with AVS.  Education complete; all questions answered.  Pt leaving ED in stable condition at this time, ambulatory with all belongings. 

## 2023-04-07 ENCOUNTER — Emergency Department (HOSPITAL_COMMUNITY): Payer: Self-pay

## 2023-04-07 ENCOUNTER — Encounter (HOSPITAL_COMMUNITY): Payer: Self-pay

## 2023-04-07 ENCOUNTER — Emergency Department (HOSPITAL_COMMUNITY)
Admission: EM | Admit: 2023-04-07 | Discharge: 2023-04-07 | Disposition: A | Discharge status: Home / Self Care | Payer: Self-pay | Attending: Emergency Medicine | Admitting: Emergency Medicine

## 2023-04-07 ENCOUNTER — Other Ambulatory Visit: Payer: Self-pay

## 2023-04-07 DIAGNOSIS — N3001 Acute cystitis with hematuria: Secondary | ICD-10-CM | POA: Insufficient documentation

## 2023-04-07 LAB — URINALYSIS, ROUTINE W REFLEX MICROSCOPIC
Bilirubin Urine: NEGATIVE
Glucose, UA: NEGATIVE mg/dL
Ketones, ur: NEGATIVE mg/dL
Nitrite: NEGATIVE
Protein, ur: >=300 mg/dL — AB
RBC / HPF: >50 RBC/hpf (ref 0–5)
Specific Gravity, Urine: 1.015 (ref 1.005–1.030)
WBC, UA: >50 WBC/hpf (ref 0–5)
pH: 6 (ref 5.0–8.0)

## 2023-04-07 LAB — CBC
HCT: 33.1 % — ABNORMAL LOW (ref 36.0–46.0)
Hemoglobin: 11 g/dL — ABNORMAL LOW (ref 12.0–15.0)
MCH: 31.4 pg (ref 26.0–34.0)
MCHC: 33.2 g/dL (ref 30.0–36.0)
MCV: 94.6 fL (ref 80.0–100.0)
Platelets: 294 10*3/uL (ref 150–400)
RBC: 3.5 MIL/uL — ABNORMAL LOW (ref 3.87–5.11)
RDW: 12.4 % (ref 11.5–15.5)
WBC: 6.9 10*3/uL (ref 4.0–10.5)
nRBC: 0 % (ref 0.0–0.2)

## 2023-04-07 LAB — BASIC METABOLIC PANEL
Anion gap: 9 (ref 5–15)
BUN: 21 mg/dL — ABNORMAL HIGH (ref 6–20)
CO2: 24 mmol/L (ref 22–32)
Calcium: 9.3 mg/dL (ref 8.9–10.3)
Chloride: 104 mmol/L (ref 98–111)
Creatinine, Ser: 0.79 mg/dL (ref 0.44–1.00)
GFR, Estimated: >60 mL/min (ref >60)
Glucose, Bld: 97 mg/dL (ref 70–99)
Potassium: 3.4 mmol/L — ABNORMAL LOW (ref 3.5–5.1)
Sodium: 137 mmol/L (ref 135–145)

## 2023-04-07 LAB — HCG, SERUM, QUALITATIVE: Preg, Serum: NEGATIVE

## 2023-04-07 MED ORDER — NITROFURANTOIN MONOHYD MACRO 100 MG PO CAPS: 100.0000 mg | ORAL_CAPSULE | Freq: Two times a day (BID) | ORAL | 0 refills | Status: AC

## 2023-04-07 MED ORDER — IOHEXOL 350 MG/ML SOLN
75.0000 mL | Freq: Once | INTRAVENOUS | Status: AC | PRN
  Administered 2023-04-07: 75 mL via INTRAVENOUS

## 2023-04-07 MED ORDER — MORPHINE SULFATE (PF) 4 MG/ML IV SOLN
4.0000 mg | Freq: Once | INTRAVENOUS | Status: AC
  Administered 2023-04-07: 4 mg via INTRAVENOUS
  Filled 2023-04-07: qty 1

## 2023-04-07 NOTE — ED Notes (Signed)
Pt returned to room from CT

## 2023-04-07 NOTE — Discharge Instructions (Addendum)
You likely have a urinary tract infection.  You have been prescribed Macrobid (nitrofurantoin). Take this antibiotic 2 times a day for the next 5 days. Take the full course of your antibiotic even if you start feeling better. Antibiotics may cause you to have diarrhea.  Please drink plenty of water at home. Avoid sugary beverages or caffeine which irritate the bladder.  I have attached office information for primary care offices below.  Please call one of these, or an office of your choice, to establish care with a primary care provider.  They will be able to further manage your polycystic kidney disease and do routine health checks.   Please return to the ER if you develop fever, chills, vomiting, worsening pain.

## 2023-04-07 NOTE — ED Notes (Signed)
Patient transported to CT 

## 2023-04-07 NOTE — ED Provider Notes (Signed)
[place] EMERGENCY DEPARTMENT AT [facility] Provider Note   CSN: [id number] Arrival date & time: 04/07/23  0020     History  Chief Complaint  Patient presents with   Urinary Frequency    Colleen Oneill is a 32 y.o. female.  With history of polycystic kidney disease, ureterolithiasis requiring stent placement presenting to the ED for evaluation of urinary frequency and dysuria as well as suprapubic and right lower quadrant abdominal pain.  The symptoms began approximately 2 weeks ago.  States his symptoms are intermittent in nature.  She also reports some vaginal burning.  She has not been sexually active for approximately 3 months.  She has had 1 female sexual partner in the past year.  She has very low concern for STIs at this time.  No vaginal itching, odor or discharge.  She states she has not followed up with urology to get her stent removed yet.  She denies any nausea or vomiting, fevers, chest pain, shortness of breath.  Reports a history of C-section but denies other abdominal surgeries. Also states she has only been having bowel movements every 3 or 4 days lately. Typically has bower movements everyday.    Urinary Frequency Associated symptoms include abdominal pain.       Home Medications Prior to Admission medications   Medication Sig Start Date End Date Taking? Authorizing Provider  acetaminophen (TYLENOL) 325 MG tablet Take 2 tablets (650 mg total) by mouth every 6 (six) hours as needed for mild pain or moderate pain. 03/04/21   Arrien, York Ram, MD  HYDROcodone-acetaminophen (NORCO/VICODIN) 5-325 MG tablet Take 2 tablets by mouth every 4 (four) hours as needed. 11/17/22   Sabas Sous, MD  polyethylene glycol powder (GLYCOLAX/MIRALAX) 17 GM/SCOOP powder Take 1 capful (17 g) with water by mouth daily as needed for mild constipation. 01/17/22   Rolly Salter, MD      Allergies    Patient has no known allergies.    Review of Systems   Review of  Systems  Gastrointestinal:  Positive for abdominal pain.  Genitourinary:  Positive for dysuria and frequency.    Physical Exam Updated Vital Signs BP 116/81 (BP Location: Right Arm)   Pulse 71   Temp 97.9 F (36.6 C) (Oral)   Resp 16   Ht 5\' 8"  (1.727 m)   Wt 56.7 kg   LMP 03/09/2023   SpO2 99%   BMI 19.01 kg/m  Physical Exam Vitals and nursing note reviewed.  Constitutional:      General: She is not in acute distress.    Appearance: She is well-developed.  HENT:     Head: Normocephalic and atraumatic.  Eyes:     Conjunctiva/sclera: Conjunctivae normal.  Cardiovascular:     Rate and Rhythm: Normal rate and regular rhythm.     Heart sounds: No murmur heard. Pulmonary:     Effort: Pulmonary effort is normal. No respiratory distress.     Breath sounds: Normal breath sounds.  Abdominal:     Palpations: Abdomen is soft.     Tenderness: There is no abdominal tenderness. There is right CVA tenderness. There is no left CVA tenderness.  Musculoskeletal:        General: No swelling.     Cervical back: Neck supple.  Skin:    General: Skin is warm and dry.     Capillary Refill: Capillary refill takes less than 2 seconds.  Neurological:     Mental Status: She is alert.  Psychiatric:        Mood and Affect: Mood normal.     ED Results / Procedures / Treatments   Labs (all labs ordered are listed, but only abnormal results are displayed) Labs Reviewed  URINALYSIS, ROUTINE W REFLEX MICROSCOPIC - Abnormal; Notable for the following components:      Result Value   Color, Urine AMBER (*)    APPearance CLOUDY (*)    Hgb urine dipstick LARGE (*)    Protein, ur >=300 (*)    Leukocytes,Ua LARGE (*)    Bacteria, UA FEW (*)    All other components within normal limits  BASIC METABOLIC PANEL - Abnormal; Notable for the following components:   Potassium 3.4 (*)    BUN 21 (*)    All other components within normal limits  CBC - Abnormal; Notable for the following components:    RBC 3.50 (*)    Hemoglobin 11.0 (*)    HCT 33.1 (*)    All other components within normal limits  HCG, SERUM, QUALITATIVE    EKG None  Radiology No results found.  Procedures Procedures    Medications Ordered in ED Medications  morphine (PF) 4 MG/ML injection 4 mg (4 mg Intravenous Given 04/07/23 0406)  iohexol (OMNIPAQUE) 350 MG/ML injection 75 mL (75 mLs Intravenous Contrast Given 04/07/23 0519)    ED Course/ Medical Decision Making/ A&P                                 Medical Decision Making Amount and/or Complexity of Data Reviewed Labs: ordered. Radiology: ordered.  Risk Prescription drug management.   This patient presents to the ED for concern of suprapubic abdominal pain, dysuria, this involves an extensive number of treatment options, and is a complaint that carries with it a high risk of complications and morbidity.  Differential diagnosis of her lower abdominal considerations include pelvic inflammatory disease, ectopic pregnancy, appendicitis, urinary calculi, primary dysmenorrhea, septic abortion, ruptured ovarian cyst or tumor, ovarian torsion, tubo-ovarian abscess, degeneration of fibroid, endometriosis, diverticulitis, cystitis.   My initial workup includes labs, imaging, pain control  Additional history obtained from: Nursing notes from this visit. Previous records within EMR system ED visits for similar on 04/27/2021, 01/15/2022, 11/16/2022 Right ureteral stent placement on 03/02/2021 by Dr. Benancio Deeds  I ordered, reviewed and interpreted labs which include: CBC, BMP, urinalysis, hCG.  Mild anemia with hemoglobin of 11.0.  Hypokalemia of 3.4.  Urine with large hemoglobin, large leukocytes, greater than 50 white blood cells, few bacteria, 11-20 epithelial cells  I ordered imaging studies including CT abdomen pelvis I independently visualized and interpreted imaging which showed pending at shift change  Afebrile, hemodynamically stable.  32 year old female  presenting to the ED for evaluation of dysuria, frequency, suprapubic and right lower quadrant pain as well as right CVA tenderness.  Symptoms have been present for the past 2 weeks.  Has a history of polycystic kidney disease with previous nephrolithiasis requiring ureteral stenting.  She has been admitted to the hospital twice in the past year for pyelonephritis for IV antibiotics.  She was supposed to follow-up with urology for stent removal.  She states she has not done this yet which complicates her course.  Urine appears contaminated but does appear consistent with a urinary tract infection.  Plan at the time to change is pending CT abdomen pelvis for further evaluation of her right lower quadrant pain.  I do suspect  patient will have pyelonephritis on this scan.  She may need a urology consult given that she still has the stent in place.  Care be handed off to oncoming provider pending results of CT abdomen pelvis.  She may need admission for IV antibiotics.  Plan discussed with oncoming provider.  Plan may change at the discretion of the oncoming provider.  Please see their note for final disposition and decision making.  Note: Portions of this report may have been transcribed using voice recognition software. Every effort was made to ensure accuracy; however, inadvertent computerized transcription errors may still be present.        Final Clinical Impression(s) / ED Diagnoses Final diagnoses:  None    Rx / DC Orders ED Discharge Orders     None         Michelle Piper, Cordelia Poche 04/07/23 0636    Dione Booze, MD 04/07/23 [phone]

## 2023-04-07 NOTE — ED Triage Notes (Signed)
Pt complaining of having urinary frequency and burning for 2 weeks. Her lower abdomen is hurting now as well.

## 2023-04-07 NOTE — ED Provider Notes (Signed)
Dysuria, increased frequency x 2 weeks. RLQ and right flank pain. No fever or chills. H/O PCKD and pyelonephritis Pending CT scan results. May need to consult urology regarding further management given history Physical Exam  BP 104/71   Pulse 64   Temp 98.1 F (36.7 C) (Oral)   Resp 18   Ht 5\' 8"  (1.727 m)   Wt 56.7 kg   LMP 03/09/2023   SpO2 100%   BMI 19.01 kg/m   Physical Exam Vitals and nursing note reviewed.  Constitutional:      Appearance: Normal appearance.  HENT:     Head: Atraumatic.  Cardiovascular:     Rate and Rhythm: Normal rate and regular rhythm.  Pulmonary:     Effort: Pulmonary effort is normal.  Abdominal:     Comments: Abdomen mildly tender to palpation in the right upper quadrant, no rebound or guarding  Neurological:     General: No focal deficit present.     Mental Status: She is alert.  Psychiatric:        Mood and Affect: Mood normal.        Behavior: Behavior normal.     Procedures  Procedures  ED Course / MDM    Medical Decision Making Amount and/or Complexity of Data Reviewed Labs: ordered. Radiology: ordered.  Risk Prescription drug management.   In short, this is a 32 year old female coming in with 2 weeks of intermittent dysuria, increased frequency.  Her urinalysis showed cloudy appearing urine with proteinuria and leukocytes.  Urine also has hemoglobin, but patient is on her menstrual cycle. Urine does not seem like a clean-catch as there are squamous epithelial cells.  CT abdomen pelvis was obtained by previous team to further evaluate given patient's history of pyelonephritis and polycystic kidney disease.  CT scan without sign of pyelonephritis or nephrolithiasis.  She does not have any leukocytosis on CBC, afebrile, not tachycardic. Denies fever or chills at home, no nausea or vomiting. Low suspicion for pyelonephritis at this time.  There was bladder wall thickening on CT concerning for cystitis.  This seems to fit with the  patient's symptoms, will treat for acute cystitis with 5-day course of Macrobid.  I also provided information for primary care offices.  I encourage patient to establish care with 1 of these soon as possible. Return precautions given       Arabella Merles, Cordelia Poche 04/07/23 0729    Dione Booze, MD 04/07/23 2240

## 2023-10-27 ENCOUNTER — Other Ambulatory Visit: Payer: Self-pay

## 2023-10-27 ENCOUNTER — Encounter (HOSPITAL_COMMUNITY): Payer: Self-pay

## 2023-10-27 ENCOUNTER — Emergency Department (HOSPITAL_COMMUNITY)
Admission: EM | Admit: 2023-10-27 | Discharge: 2023-10-27 | Disposition: A | Discharge status: Home / Self Care | Payer: Self-pay | Attending: Emergency Medicine | Admitting: Emergency Medicine

## 2023-10-27 DIAGNOSIS — K5900 Constipation, unspecified: Secondary | ICD-10-CM | POA: Diagnosis not present

## 2023-10-27 DIAGNOSIS — R300 Dysuria: Secondary | ICD-10-CM | POA: Insufficient documentation

## 2023-10-27 DIAGNOSIS — R109 Unspecified abdominal pain: Secondary | ICD-10-CM | POA: Diagnosis present

## 2023-10-27 DIAGNOSIS — R1084 Generalized abdominal pain: Secondary | ICD-10-CM

## 2023-10-27 LAB — URINALYSIS, ROUTINE W REFLEX MICROSCOPIC
Bilirubin Urine: NEGATIVE
Glucose, UA: NEGATIVE mg/dL
Ketones, ur: NEGATIVE mg/dL
Nitrite: NEGATIVE
Protein, ur: >=300 mg/dL — AB
RBC / HPF: >50 RBC/hpf (ref 0–5)
Specific Gravity, Urine: 1.018 (ref 1.005–1.030)
WBC, UA: >50 WBC/hpf (ref 0–5)
pH: 7 (ref 5.0–8.0)

## 2023-10-27 LAB — CBC
HCT: 35.9 % — ABNORMAL LOW (ref 36.0–46.0)
Hemoglobin: 12 g/dL (ref 12.0–15.0)
MCH: 32.3 pg (ref 26.0–34.0)
MCHC: 33.4 g/dL (ref 30.0–36.0)
MCV: 96.8 fL (ref 80.0–100.0)
Platelets: 312 10*3/uL (ref 150–400)
RBC: 3.71 MIL/uL — ABNORMAL LOW (ref 3.87–5.11)
RDW: 12 % (ref 11.5–15.5)
WBC: 7.5 10*3/uL (ref 4.0–10.5)
nRBC: 0 % (ref 0.0–0.2)

## 2023-10-27 LAB — HCG, SERUM, QUALITATIVE: Preg, Serum: NEGATIVE

## 2023-10-27 LAB — COMPREHENSIVE METABOLIC PANEL WITH GFR
ALT: 9 U/L (ref 0–44)
AST: 15 U/L (ref 15–41)
Albumin: 4.4 g/dL (ref 3.5–5.0)
Alkaline Phosphatase: 33 U/L — ABNORMAL LOW (ref 38–126)
Anion gap: 8 (ref 5–15)
BUN: 25 mg/dL — ABNORMAL HIGH (ref 6–20)
CO2: 23 mmol/L (ref 22–32)
Calcium: 9.4 mg/dL (ref 8.9–10.3)
Chloride: 107 mmol/L (ref 98–111)
Creatinine, Ser: 0.68 mg/dL (ref 0.44–1.00)
GFR, Estimated: >60 mL/min (ref >60)
Glucose, Bld: 97 mg/dL (ref 70–99)
Potassium: 3.4 mmol/L — ABNORMAL LOW (ref 3.5–5.1)
Sodium: 138 mmol/L (ref 135–145)
Total Bilirubin: 0.6 mg/dL (ref 0.0–1.2)
Total Protein: 7.5 g/dL (ref 6.5–8.1)

## 2023-10-27 LAB — LIPASE, BLOOD: Lipase: 26 U/L (ref 11–51)

## 2023-10-27 MED ORDER — FLUCONAZOLE 200 MG PO TABS
200.0000 mg | ORAL_TABLET | Freq: Once | ORAL | 0 refills | Status: AC
Start: 2023-10-27 — End: 2023-10-27

## 2023-10-27 MED ORDER — CEPHALEXIN 500 MG PO CAPS
500.0000 mg | ORAL_CAPSULE | Freq: Once | ORAL | Status: AC
  Administered 2023-10-27: 500 mg via ORAL
  Filled 2023-10-27: qty 1

## 2023-10-27 MED ORDER — POLYETHYLENE GLYCOL 3350 17 GM/SCOOP PO POWD: 17.0000 g | Freq: Every day | ORAL | 0 refills | Status: DC | PRN

## 2023-10-27 MED ORDER — CEPHALEXIN 500 MG PO CAPS
500.0000 mg | ORAL_CAPSULE | Freq: Two times a day (BID) | ORAL | 0 refills | Status: AC
Start: 2023-10-28 — End: 2023-11-04

## 2023-10-27 MED ORDER — KETOROLAC TROMETHAMINE 60 MG/2ML IM SOLN
30.0000 mg | Freq: Once | INTRAMUSCULAR | Status: AC
  Administered 2023-10-27: 30 mg via INTRAMUSCULAR
  Filled 2023-10-27: qty 2

## 2023-10-27 NOTE — ED Triage Notes (Signed)
 Generalized abdominal pain, pain when urinating, frequent urination with cloudiness and discoloration. This has been on and off the last month. Pt states she has kidney failure.

## 2023-10-27 NOTE — ED Provider Triage Note (Signed)
 Emergency Medicine Provider Triage Evaluation Note  TEXAS ZOGG , a 33 y.o. female  was evaluated in triage.  Pt complains of generalized abdominal pain for the past week. Reports decreased bowel movement. Some nausea but no diarrhea. An episode of vomiting about 2 weeks ago. No fever or chills. Also increased urinary frequency and dysuria. Reports she is sexually active and has had STIs in the past. Denies any abnormal vaginal discharge or known exposure  Review of Systems  Positive: As above Negative: As above  Physical Exam  BP (!) 134/93 (BP Location: Left Arm)   Pulse (!) 57   Temp 98.6 F (37 C) (Oral)   Resp 18   Ht 5\' 8"  (1.727 m)   Wt 56.7 kg   SpO2 100%   BMI 19.01 kg/m  Gen:   Awake, no distress   Resp:  Normal effort  MSK:   Moves extremities without difficulty    Medical Decision Making  Medically screening exam initiated at 8:15 PM.  Appropriate orders placed.  SHRAVYA JULSON was informed that the remainder of the evaluation will be completed by another provider, this initial triage assessment does not replace that evaluation, and the importance of remaining in the ED until their evaluation is complete.     Rexie Catena, PA-C 10/27/23 2018

## 2023-10-27 NOTE — ED Provider Notes (Signed)
 [place] EMERGENCY DEPARTMENT AT [facility] Provider Note   CSN: [id number] Arrival date & time: 10/27/23  1950     History  Chief Complaint  Patient presents with  . Urinary Frequency  . Abdominal Pain    Colleen Oneill is a 33 y.o. female presenting to the ED with a history of polycystic ovarian syndrome, presenting to ED with complaint of urinary dysuria and abdominal discomfort.  Patient reports that she has had burning discomfort with urination for several days.  It feels like her prior UTIs.  She has been treated for urinary infections in the past.  She also ports she has been constipated for the past 4 days, passing gas but no bowel movement.  She says her abdomen feels sore.  HPI     Home Medications Prior to Admission medications   Medication Sig Start Date End Date Taking? Authorizing Provider  cephALEXin  (KEFLEX ) 500 MG capsule Take 1 capsule (500 mg total) by mouth 2 (two) times daily for 7 days. 10/28/23 11/04/23 Yes Nickalus Thornsberry, Janalyn Me, MD  fluconazole (DIFLUCAN) 200 MG tablet Take 1 tablet (200 mg total) by mouth once for 1 dose. 10/27/23 10/27/23 Yes Jesua Tamblyn, Janalyn Me, MD  acetaminophen  (TYLENOL ) 325 MG tablet Take 2 tablets (650 mg total) by mouth every 6 (six) hours as needed for mild pain or moderate pain. 03/04/21   Arrien, Mauricio Daniel, MD  polyethylene glycol powder (GLYCOLAX /MIRALAX ) 17 GM/SCOOP powder Take 1 capful (17 g) with water by mouth daily as needed for mild constipation. 01/17/22   Kraig Peru, MD      Allergies    Patient has no known allergies.    Review of Systems   Review of Systems  Physical Exam Updated Vital Signs BP (!) 134/93 (BP Location: Left Arm)   Pulse (!) 57   Temp 98.6 F (37 C) (Oral)   Resp 18   Ht 5\' 8"  (1.727 m)   Wt 56.7 kg   SpO2 100%   BMI 19.01 kg/m  Physical Exam Constitutional:      General: She is not in acute distress. HENT:     Head: Normocephalic and atraumatic.  Eyes:     Conjunctiva/sclera:  Conjunctivae normal.     Pupils: Pupils are equal, round, and reactive to light.  Cardiovascular:     Rate and Rhythm: Normal rate and regular rhythm.  Pulmonary:     Effort: Pulmonary effort is normal. No respiratory distress.  Abdominal:     General: There is no distension.     Tenderness: There is no abdominal tenderness.  Skin:    General: Skin is warm and dry.  Neurological:     General: No focal deficit present.     Mental Status: She is alert. Mental status is at baseline.  Psychiatric:        Mood and Affect: Mood normal.        Behavior: Behavior normal.     ED Results / Procedures / Treatments   Labs (all labs ordered are listed, but only abnormal results are displayed) Labs Reviewed  COMPREHENSIVE METABOLIC PANEL WITH GFR - Abnormal; Notable for the following components:      Result Value   Potassium 3.4 (*)    BUN 25 (*)    Alkaline Phosphatase 33 (*)    All other components within normal limits  CBC - Abnormal; Notable for the following components:   RBC 3.71 (*)    HCT 35.9 (*)  All other components within normal limits  URINALYSIS, ROUTINE W REFLEX MICROSCOPIC - Abnormal; Notable for the following components:   APPearance CLOUDY (*)    Hgb urine dipstick LARGE (*)    Protein, ur >=300 (*)    Leukocytes,Ua LARGE (*)    Bacteria, UA RARE (*)    All other components within normal limits  URINE CULTURE  LIPASE, BLOOD  HCG, SERUM, QUALITATIVE  GC/CHLAMYDIA PROBE AMP () NOT AT [facility]    EKG None  Radiology No results found.  Procedures Procedures    Medications Ordered in ED Medications  cephALEXin  (KEFLEX ) capsule 500 mg (has no administration in time range)    ED Course/ Medical Decision Making/ A&P Clinical Course as of 10/27/23 2325  Tue Oct 27, 2023  2324 Patient requested some small pain medicine prior to leaving we can do a single dose of IM Toradol . [MT]    Clinical Course User Index [MT] Jimmye Wisnieski, Janalyn Me, MD                                  Medical Decision Making Amount and/or Complexity of Data Reviewed Labs: ordered.  Risk Prescription drug management.   Patient is here with dysuria and abdominal soreness or tenderness.    Differential includes UTI versus constipation pain versus other intra-abdominal process  I personally reviewed and interpreted her labs.  These were notable for UA with leukocytes, negative nitrites.  White blood cell count is normal.  LFTs and lipase normal.  Acute is reasonable to treat for potential UTI with her symptoms.  Will start on Keflex .  Low suspicion for pyelonephritis.  Low suspicion for sepsis.  Doubt acute appendicitis or surgical emergency of the abdomen.  She has a reassuring abdominal exam with no leukocytosis, and so I think it is reasonable to try bowel cleanout at home.  If her abdominal pain worsens she will need to come back to the ER and may need a CT scan at this time, but I would prefer to avoid repeat and unnecessary CT imaging.  I doubt PID, ovarian torsion or TOA.  We will start her on Keflex  here.  Prescribed Keflex  and MiraLAX  for home.  Patient verbalized understanding and is stable for discharge         Final Clinical Impression(s) / ED Diagnoses Final diagnoses:  Dysuria  Constipation, unspecified constipation type  Generalized abdominal pain    Rx / DC Orders ED Discharge Orders          Ordered    cephALEXin  (KEFLEX ) 500 MG capsule  2 times daily        10/27/23 2241    fluconazole (DIFLUCAN) 200 MG tablet   Once        10/27/23 2241              Arvilla Birmingham, MD 10/27/23 2241

## 2023-10-28 LAB — GC/CHLAMYDIA PROBE AMP (~~LOC~~) NOT AT ARMC
Chlamydia: NEGATIVE
Comment: NEGATIVE
Comment: NORMAL
Neisseria Gonorrhea: NEGATIVE

## 2023-10-29 LAB — URINE CULTURE

## 2023-12-29 ENCOUNTER — Other Ambulatory Visit: Payer: Self-pay

## 2023-12-29 ENCOUNTER — Emergency Department (HOSPITAL_COMMUNITY)
Admission: EM | Admit: 2023-12-29 | Discharge: 2023-12-29 | Disposition: A | Discharge status: Home / Self Care | Attending: Emergency Medicine | Admitting: Emergency Medicine

## 2023-12-29 ENCOUNTER — Emergency Department (HOSPITAL_COMMUNITY)

## 2023-12-29 ENCOUNTER — Encounter (HOSPITAL_COMMUNITY): Payer: Self-pay | Admitting: *Deleted

## 2023-12-29 DIAGNOSIS — N3000 Acute cystitis without hematuria: Secondary | ICD-10-CM | POA: Diagnosis not present

## 2023-12-29 DIAGNOSIS — R109 Unspecified abdominal pain: Secondary | ICD-10-CM | POA: Diagnosis present

## 2023-12-29 LAB — URINALYSIS, ROUTINE W REFLEX MICROSCOPIC
Bilirubin Urine: NEGATIVE
Glucose, UA: NEGATIVE mg/dL
Ketones, ur: NEGATIVE mg/dL
Nitrite: POSITIVE — AB
Protein, ur: >=300 mg/dL — AB
Specific Gravity, Urine: 1.014 (ref 1.005–1.030)
WBC, UA: >50 WBC/hpf (ref 0–5)
pH: 8 (ref 5.0–8.0)

## 2023-12-29 LAB — COMPREHENSIVE METABOLIC PANEL WITH GFR
ALT: 9 U/L (ref 0–44)
AST: 14 U/L — ABNORMAL LOW (ref 15–41)
Albumin: 4.5 g/dL (ref 3.5–5.0)
Alkaline Phosphatase: 31 U/L — ABNORMAL LOW (ref 38–126)
Anion gap: 11 (ref 5–15)
BUN: 23 mg/dL — ABNORMAL HIGH (ref 6–20)
CO2: 24 mmol/L (ref 22–32)
Calcium: 9.9 mg/dL (ref 8.9–10.3)
Chloride: 104 mmol/L (ref 98–111)
Creatinine, Ser: 0.69 mg/dL (ref 0.44–1.00)
GFR, Estimated: >60 mL/min (ref >60)
Glucose, Bld: 96 mg/dL (ref 70–99)
Potassium: 3.7 mmol/L (ref 3.5–5.1)
Sodium: 139 mmol/L (ref 135–145)
Total Bilirubin: 0.6 mg/dL (ref 0.0–1.2)
Total Protein: 7.9 g/dL (ref 6.5–8.1)

## 2023-12-29 LAB — CBC WITH DIFFERENTIAL/PLATELET
Abs Immature Granulocytes: 0.02 K/uL (ref 0.00–0.07)
Basophils Absolute: 0 K/uL (ref 0.0–0.1)
Basophils Relative: 1 %
Eosinophils Absolute: 0.1 K/uL (ref 0.0–0.5)
Eosinophils Relative: 1 %
HCT: 37 % (ref 36.0–46.0)
Hemoglobin: 12.3 g/dL (ref 12.0–15.0)
Immature Granulocytes: 0 %
Lymphocytes Relative: 35 %
Lymphs Abs: 2.3 K/uL (ref 0.7–4.0)
MCH: 32 pg (ref 26.0–34.0)
MCHC: 33.2 g/dL (ref 30.0–36.0)
MCV: 96.4 fL (ref 80.0–100.0)
Monocytes Absolute: 0.4 K/uL (ref 0.1–1.0)
Monocytes Relative: 6 %
Neutro Abs: 3.7 K/uL (ref 1.7–7.7)
Neutrophils Relative %: 57 %
Platelets: 312 K/uL (ref 150–400)
RBC: 3.84 MIL/uL — ABNORMAL LOW (ref 3.87–5.11)
RDW: 12 % (ref 11.5–15.5)
WBC: 6.4 K/uL (ref 4.0–10.5)
nRBC: 0 % (ref 0.0–0.2)

## 2023-12-29 LAB — HCG, SERUM, QUALITATIVE: Preg, Serum: NEGATIVE

## 2023-12-29 LAB — LIPASE, BLOOD: Lipase: 22 U/L (ref 11–51)

## 2023-12-29 MED ORDER — CEFTRIAXONE SODIUM 1 G IJ SOLR: 1.0000 g | Freq: Once | INTRAMUSCULAR | Status: DC

## 2023-12-29 MED ORDER — HYDROCODONE-ACETAMINOPHEN 5-325 MG PO TABS
2.0000 | ORAL_TABLET | Freq: Once | ORAL | Status: AC
  Administered 2023-12-29: 2 via ORAL
  Filled 2023-12-29: qty 2

## 2023-12-29 MED ORDER — CEPHALEXIN 500 MG PO CAPS: 500.0000 mg | ORAL_CAPSULE | Freq: Four times a day (QID) | ORAL | 0 refills | Status: DC

## 2023-12-29 MED ORDER — CEPHALEXIN 500 MG PO CAPS: 500.0000 mg | ORAL_CAPSULE | Freq: Two times a day (BID) | ORAL | 0 refills | Status: AC

## 2023-12-29 MED ORDER — NAPROXEN 500 MG PO TABS
500.0000 mg | ORAL_TABLET | Freq: Two times a day (BID) | ORAL | 0 refills | Status: AC | PRN
Start: 2023-12-29 — End: 2024-01-05

## 2023-12-29 MED ORDER — SODIUM CHLORIDE 0.9 % IV SOLN
1.0000 g | Freq: Once | INTRAVENOUS | Status: AC
  Administered 2023-12-29: 1 g via INTRAVENOUS
  Filled 2023-12-29: qty 10

## 2023-12-29 MED ORDER — IOHEXOL 300 MG/ML  SOLN
100.0000 mL | Freq: Once | INTRAMUSCULAR | Status: AC | PRN
  Administered 2023-12-29: 80 mL via INTRAVENOUS

## 2023-12-29 MED ORDER — CIPROFLOXACIN HCL 500 MG PO TABS: 500.0000 mg | ORAL_TABLET | Freq: Two times a day (BID) | ORAL | 0 refills | Status: DC

## 2023-12-29 NOTE — ED Triage Notes (Signed)
 Here by POV from home for lower abd/pelvic pain, dysuria. Ongoing for over 1 week. Describes as recurrent, h/o similar, including: pyelonephritis, cystitis, dysuria, and PCOS. LMP 6/26. Also endorses nausea. Denies rash, itching, lesion, d/c, or bleeding. Alert, NAD, calm, pale.

## 2023-12-29 NOTE — Consult Note (Signed)
 I have been asked to see the patient by Beverley Hamilton, PA-C, for evaluation and management of persistent ureteral stent..  History of present illness: Colleen Oneill is a 33 year old female with history of polycystic kidney disease who had a distal right ureteral stone that was stented in 2022 by Dr. Rosalind for obstructive pyelonephritis.  Patient never followed up.  She came in today for urgency and frequency.  CT demonstrates the stent in the same location with significant calcifications on the bladder as well as renal pelvis stent.  She denies significant pain denies fevers or chills.  She states she does get recurrent urinary tract infections.  Patient does have a history of polycystic kidney disease is not followed for this.  She denies any medical problems other than polycystic kidney disease  Past surgical history includes C-section x 4.  And right ureteral stent.  Patient's labs include white count is within normal limits as well as kidney function lites within normal limits.   Review of systems: A 12 point comprehensive review of systems was obtained and is negative unless otherwise stated in the history of present illness.  Patient Active Problem List   Diagnosis Date Noted   Odontogenic infection of jaw 01/16/2022   Nausea and vomiting 04/27/2021   Pyelonephritis 03/02/2021   Sepsis (HCC) 03/01/2021   Complicated UTI (urinary tract infection) 03/01/2021   Polycystic kidney disease 03/01/2021   Anemia, chronic disease 03/01/2021   Premature uterine contractions 08/23/2013   Fetal heart rate decelerations, delivered 08/23/2013   Nausea and vomiting in pregnancy prior to [redacted] weeks gestation 03/15/2013    No current facility-administered medications on file prior to encounter.   Current Outpatient Medications on File Prior to Encounter  Medication Sig Dispense Refill   acetaminophen  (TYLENOL ) 325 MG tablet Take 2 tablets (650 mg total) by mouth every 6 (six) hours as needed for mild  pain or moderate pain.     polyethylene glycol powder (GLYCOLAX /MIRALAX ) 17 GM/SCOOP powder Take 1 capful (17 g) with water by mouth daily as needed for mild constipation. 238 g 0    Past Medical History:  Diagnosis Date   Medical history non-contributory    Polycystic kidney disease     Past Surgical History:  Procedure Laterality Date   CESAREAN SECTION     CESAREAN SECTION N/A 08/09/2012   Procedure: Repeat CESAREAN SECTION of baby boy  at 0431  APGAR 9/9;  Surgeon: Aida DELENA Na, MD;  Location: [facility];  Service: Obstetrics;  Laterality: N/A;   CESAREAN SECTION N/A 08/23/2013   Procedure: CESAREAN SECTION;  Surgeon: Carlin DELENA Centers, MD;  Location: [facility];  Service: Obstetrics;  Laterality: N/A;   CYSTOSCOPY/URETEROSCOPY/HOLMIUM LASER/STENT PLACEMENT Right 03/02/2021   Procedure: CYSTOSCOPY; RIGHT RETROGRADE PYELOGRAM; RIGTH URETERAL STENT PLACEMENT;  Surgeon: Rosalind Zachary NOVAK, MD;  Location: [facility];  Service: Urology;  Laterality: Right;    Social History   Tobacco Use   Smoking status: Never   Smokeless tobacco: Never  Substance Use Topics   Alcohol use: No   Drug use: No    Family History  Problem Relation Age of Onset   Kidney disease Father    Diabetes Maternal Grandmother    Other Neg Hx     PE: Vitals:   12/29/23 1235 12/29/23 1255 12/29/23 1606 12/29/23 1900  BP: (!) 134/94  (!) 129/95 135/87  Pulse: 100  77 80  Resp: 18  15 16   Temp: 98.4 F (36.9 C)  98.1 F (36.7 C) 98  F (36.7 C)  TempSrc:   Oral Oral  SpO2: 95%  100% 99%  Weight:  56.2 kg    Height:  5' 8 (1.727 m)     Patient appears to be in no acute distress  patient is alert and oriented x3 Respiratory: Normal work of breathing room air Cardiovascular: Regular rate rhythm per monitor. Abdomen: Soft no tenderness mild suprapubic tenderness. GU: No catheter   Recent Labs    12/29/23 1302  WBC 6.4  HGB 12.3  HCT 37.0   Recent Labs    12/29/23 1302  NA 139  K 3.7  CL 104   CO2 24  GLUCOSE 96  BUN 23*  CREATININE 0.69  CALCIUM  9.9   No results for input(s): LABPT, INR in the last 72 hours. No results for input(s): LABURIN in the last 72 hours. Results for orders placed or performed during the hospital encounter of 10/27/23  Urine Culture     Status: Abnormal   Collection Time: 10/27/23  8:14 PM   Specimen: Urine, Clean Catch  Result Value Ref Range Status   Specimen Description   Final    URINE, CLEAN CATCH Performed at [facility], [address], [place], [place] [address]    Special Requests   Final    NONE Performed at [facility], [address], [place], [place] [address]    Culture MULTIPLE SPECIES PRESENT, SUGGEST RECOLLECTION (A)  Final   Report Status 10/29/2023 FINAL  Final    Imaging: CT 12/29/2023: IMPRESSION: 1. The bladder is decompressed with right ureteral stent in satisfactory position. Mild circumferential bladder wall thickening may be secondary to underdistention or cystitis. Recommend correlation with urinalysis. 2. Nonobstructive punctate left renal calculi measuring up to 2 mm. No hydronephrosis. 3. Similar innumerable cystic lesions of variable density within enlarged bilateral kidneys, compatible with autosomal dominant polycystic kidney disease.  Imp: 33 year old female with retained right ureteral stent.  The stent is now calcified kidney function and infectious labs show no concerns for systemic infection.  Will plan to have patient come back to clinic and treat as an outpatient.  If patient is unable to get outpatient follow-up encouraged her to come back to ER for eventual treatment.  Discussed with the patient likely treatment will be ureteroscopy with laser lithotripsy. We discussed risk benefits alternatives to ureteroscopy.  This included bleeding infection and damage to surrounding structures surrounding structures including ureter as well as urethra.  We  discussed the need for stent postoperatively as well as the potential symptoms of stent placement.  We discussed possible inability to complete procedure due to caliber of your ureter or inability to pass stone possibly requiring long-term stent versus nephrostomy tube.  We discussed need for possible second surgery.  Patient voiced their understanding and consent was obtained.   Patient may need percutaneous nephrostomy tube unclear how this will be effective with polycystic kidney disease.  Recommendations: Patient okay to be discharged Patient will be set up for outpatient ureteroscopy with laser lithotripsy   Thank you for involving me in this patient's care, I will continue to follow along.Please page with any further questions or concerns. Steffan JAYSON Pea

## 2023-12-29 NOTE — ED Provider Triage Note (Signed)
 Emergency Medicine Provider Triage Evaluation Note  CLOMA RAHRIG , a 33 y.o. female  was evaluated in triage.  Pt complains of lower abdominal pain and dysuria over the last week.  History of ureteral stents in the past, states that this is not ever been removed.  Has had recurrent UTI since..  Review of Systems  Positive: As above Negative:   Physical Exam  BP (!) 134/94   Pulse 100   Temp 98.4 F (36.9 C)   Resp 18   Ht 5' 8 (1.727 m)   Wt 56.2 kg   LMP 12/17/2023 (Exact Date)   SpO2 95%   BMI 18.85 kg/m  Gen:   Awake, no distress   Resp:  Normal effort  MSK:   Moves extremities without difficulty  Other:  Lower abdominal pain and discomfort, abdomen is grossly tender to palpation  Medical Decision Making  Medically screening exam initiated at 3:01 PM.  Appropriate orders placed.  ZABDI MIS was informed that the remainder of the evaluation will be completed by another provider, this initial triage assessment does not replace that evaluation, and the importance of remaining in the ED until their evaluation is complete.  Due to persistent lower abdominal pain, recurrent UTIs, and history of retained ureteral stent, will order imaging of this patient along with initial pain management and IV.  Likely consult needed with urology.   Myriam Dorn BROCKS, [place] 12/29/23 [phone]

## 2023-12-29 NOTE — ED Provider Notes (Signed)
 [place] [facility]ERGENC[facility] DEPARTMENT AT [facility] Provider Note   CSN: [id number] Arrival date & time: 12/29/23  1225     Patient presents with: Abdominal Pain   Colleen Oneill is a 33 y.o. female.   33 year old female presenting with UTI symptoms, abdominal pain, back pain.  Patient has a history of repeated UTIs and pyelonephritis, says her symptoms began about 1 week ago.  Denies fevers at home but has experienced progressively worsening abdominal pain all over but worse in the lower abdomen, as well as back pain R>L.  She reports occasional nausea but denies vomiting, fevers, chills, diarrhea.  History of ureteral stent placement in 2023, it does not appear that she followed up with urology for removal of this.   Abdominal Pain      Prior to Admission medications   Medication Sig Start Date End Date Taking? Authorizing Provider  acetaminophen  (TYLENOL ) 325 MG tablet Take 2 tablets (650 mg total) by mouth every 6 (six) hours as needed for mild pain or moderate pain. 03/04/21   Arrien, Mauricio Daniel, MD  polyethylene glycol powder (GLYCOLAX /MIRALAX ) 17 GM/SCOOP powder Take 1 capful (17 g) with water by mouth daily as needed for mild constipation. 10/27/23   Cottie Donnice PARAS, MD    Allergies: Patient has no known allergies.    Review of Systems  Gastrointestinal:  Positive for abdominal pain.    Updated Vital Signs  Vitals:   12/29/23 1235 12/29/23 1255 12/29/23 1606 12/29/23 1900  BP: (!) 134/94  (!) 129/95 135/87  Pulse: 100  77 80  Resp: 18  15 16   Temp: 98.4 F (36.9 C)  98.1 F (36.7 C) 98 F (36.7 C)  TempSrc:   Oral Oral  SpO2: 95%  100% 99%  Weight:  56.2 kg    Height:  5' 8 (1.727 m)       Physical Exam Vitals and nursing note reviewed.  HENT:     Head: Normocephalic.  Eyes:     Extraocular Movements: Extraocular movements intact.  Cardiovascular:     Rate and Rhythm: Normal rate and regular rhythm.  Pulmonary:     Effort: Pulmonary  effort is normal.     Breath sounds: Normal breath sounds.  Abdominal:     Tenderness: There is generalized abdominal tenderness (worse in suprapubic region). There is right CVA tenderness (R>>>L), left CVA tenderness (R>>>L) and guarding.  Musculoskeletal:     Cervical back: Normal range of motion.     Comments: Moves all extremities spontaneously without difficulty  Skin:    General: Skin is warm and dry.  Neurological:     Mental Status: She is alert and oriented to person, place, and time.     (all labs ordered are listed, but only abnormal results are displayed) Labs Reviewed  COMPREHENSIVE METABOLIC PANEL WITH GFR - Abnormal; Notable for the following components:      Result Value   BUN 23 (*)    AST 14 (*)    Alkaline Phosphatase 31 (*)    All other components within normal limits  URINALYSIS, ROUTINE W REFLEX MICROSCOPIC - Abnormal; Notable for the following components:   APPearance TURBID (*)    Hgb urine dipstick SMALL (*)    Protein, ur >=300 (*)    Nitrite POSITIVE (*)    Leukocytes,Ua MODERATE (*)    Bacteria, UA MANY (*)    All other components within normal limits  CBC WITH DIFFERENTIAL/PLATELET - Abnormal; Notable for the following components:  RBC 3.84 (*)    All other components within normal limits  URINE CULTURE  LIPASE, BLOOD  HCG, SERUM, QUALITATIVE    EKG: None  Radiology: CT ABDOMEN PELVIS W CONTRAST Result Date: 12/29/2023 CLINICAL DATA:  Lower abdominal/pelvic pain.  Dysuria. EXAM: CT ABDOMEN AND PELVIS WITH CONTRAST TECHNIQUE: Multidetector CT imaging of the abdomen and pelvis was performed using the standard protocol following bolus administration of intravenous contrast. RADIATION DOSE REDUCTION: This exam was performed according to the departmental dose-optimization program which includes automated exposure control, adjustment of the mA and/or kV according to patient size and/or use of iterative reconstruction technique. CONTRAST:  80mL  OMNIPAQUE  IOHEXOL  300 MG/ML  SOLN COMPARISON:  CT abdomen/pelvis dated 04/07/2023. FINDINGS: Lower chest: No acute abnormality. Hepatobiliary: Redemonstration of innumerable small scattered hepatic cysts. Previously measured cyst in segment III of the left hepatic lobe is unchanged in size and measures 1.8 cm. Previously measured cyst at the right hepatic dome is unchanged in size and measures 1.2 cm. Additional stable scattered subcentimeter focal hypodensities are too small to definitively characterize, although favored to represent cysts. Gallbladder is unremarkable. No biliary dilatation. Pancreas: Unremarkable. No pancreatic ductal dilatation or surrounding inflammatory changes. Spleen: Normal in size without focal abnormality. Adrenals/Urinary Tract: Adrenal glands are unremarkable. Kidneys enhance symmetrically. Redemonstration of similar innumerable cystic lesions of variable density within the enlarged bilateral kidneys, compatible with history of autosomal dominant polycystic kidney disease. Similarly positioned right ureteral stent with proximal pigtail along the right superior pole renal calyx and distal pigtail within the urinary bladder lumen. No right-sided urolithiasis. No hydronephrosis or hydroureter. Nonobstructive punctate left renal calculi measuring up to 2 mm. The bladder is decompressed with circumferential bladder wall thickening. Stomach/Bowel: Stomach is within normal limits. Small bowel and colon are grossly unremarkable. Appendix appears normal. No evidence of obstruction or focal inflammatory changes. Vascular/Lymphatic: Abdominal aorta is normal in caliber. No enlarged abdominal or pelvic lymph nodes by size criteria. Reproductive: Uterus and bilateral adnexa are unremarkable. Other: Trace abdominopelvic ascites is likely physiologic given the patient's age. No intraperitoneal free air. Unchanged tiny fat containing umbilical hernia. Musculoskeletal: No acute or significant osseous  findings. IMPRESSION: 1. The bladder is decompressed with right ureteral stent in satisfactory position. Mild circumferential bladder wall thickening may be secondary to underdistention or cystitis. Recommend correlation with urinalysis. 2. Nonobstructive punctate left renal calculi measuring up to 2 mm. No hydronephrosis. 3. Similar innumerable cystic lesions of variable density within enlarged bilateral kidneys, compatible with autosomal dominant polycystic kidney disease. Electronically Signed   By: Harrietta Sherry M.D.   On: 12/29/2023 16:31     Procedures   Medications Ordered in the ED  HYDROcodone -acetaminophen  (NORCO/VICODIN) 5-325 MG per tablet 2 tablet (2 tablets Oral Given 12/29/23 1513)  iohexol  (OMNIPAQUE ) 300 MG/ML solution 100 mL (80 mLs Intravenous Contrast Given 12/29/23 1550)  cefTRIAXone  (ROCEPHIN ) 1 g in sodium chloride  0.9 % 100 mL IVPB (0 g Intravenous Stopped 12/29/23 1915)                                    Medical Decision Making This patient presents to the ED for concern of dysuria/abdominal pain/flank pain, this involves an extensive number of treatment options, and is a complaint that carries with it a high risk of complications and morbidity.  The differential diagnosis includes urinary tract infection, pyelonephritis, nephrolithiasis, ureterolithiasis, complication from retained ureteral stent.   Co morbidities that  complicate the patient evaluation  Polycystic kidney disease, history of UTIs/pyelonephritis   Additional history obtained:  Additional history obtained from chart review External records from outside source obtained and reviewed including prior ED notes   Lab Tests:  I Ordered, and personally interpreted labs.  The pertinent results include: CBC unremarkable, no leukocytosis.  CMP largely stable as compared to previous.  Lipase within normal limits.  Serum hCG negative.  Urinalysis notable for proteinuria with positive leukocytes and nitrites  consistent with urinary tract infection, will send for culture.   Imaging Studies ordered:  I ordered imaging studies including CT abdomen/pelvis I independently visualized and interpreted imaging which showed 1. The bladder is decompressed with right ureteral stent in satisfactory position. Mild circumferential bladder wall thickening may be secondary to underdistention or cystitis. Recommend correlation with urinalysis. 2. Nonobstructive punctate left renal calculi measuring up to 2 mm. No hydronephrosis. 3. Similar innumerable cystic lesions of variable density within enlarged bilateral kidneys, compatible with autosomal dominant polycystic kidney disease.  I agree with the radiologist interpretation   Cardiac Monitoring: / EKG:  The patient was maintained on a cardiac monitor.  I personally viewed and interpreted the cardiac monitored which showed an underlying rhythm of: NSR   Consultations Obtained:  I requested consultation with the urologist,  and discussed lab and imaging findings as well as pertinent plan - they recommend: spoke with Dr. Shane who agrees to come evaluate the patient and discuss options. His recommendations are as follows: - Patient okay to be discharged - Patient will be set up for outpatient ureteroscopy with laser lithotripsy See consult note for further details.    Problem List / ED Course / Critical interventions / Medication management  I ordered medication including IV Rocephin   for UTI  Reevaluation of the patient after these medicines showed that the patient stayed the same I have reviewed the patients home medicines and have made adjustments as needed   Social Determinants of Health:  Financial instability   Test / Admission - Considered:  Patient is hemodynamically stable, afebrile , no tachycardia.  Labs are without leukocytosis, urine is notable for signs of infection, will send for culture.  CT results as above.  I have low suspicion  for pyelonephritis at this time given patient's reassuring vital signs and no evidence of leukocytosis, however she does have a urinary tract infection and is at risk for developing pyelonephritis.  IV Rocephin  given in the emergency department this evening, will discharge with 1 week course of antibiotic.  Patient was seen and evaluated by Dr. Shane with urology, see his consult note.  Spoke with Dr. Shane who has advised the patient to follow-up in the outpatient setting, however he is concerned that the patient will not follow-up as she does not have health insurance.  He discussed this in depth with the patient and she is in agreement to return to the emergency department Sunday/Monday to be seen by Dr. Showalter for potential surgery.  Patient understands these instructions, strict return precautions discussed.  She is appropriate discharge at this time.    Amount and/or Complexity of Data Reviewed Labs: ordered.  Risk Prescription drug management.        Final diagnoses:  Acute cystitis without hematuria    ED Discharge Orders          Ordered    ciprofloxacin (CIPRO) 500 MG tablet  Every 12 hours,   Status:  Discontinued        07 /08/25 1452  cephALEXin  (KEFLEX ) 500 MG capsule  4 times daily,   Status:  Discontinued        12/29/23 1910    naproxen  (NAPROSYN ) 500 MG tablet  2 times daily PRN        12/29/23 1910    cephALEXin  (KEFLEX ) 500 MG capsule  2 times daily        12/29/23 1910               Glendia Rocky SAILOR, [place] 12/29/23 2352    Bari Roxie HERO, DO 12/31/23 1541

## 2023-12-29 NOTE — Discharge Instructions (Addendum)
 Start Keflex , 1 tablet by mouth twice daily for 7 days.  Start naproxen , you may take 1 tablet by mouth up to twice daily as needed for pain, do not use other NSAIDs while on this medication.  Follow-up with Dr. Shane as directed.  Return to the emergency department if your symptoms worsen.

## 2023-12-31 LAB — URINE CULTURE

## 2024-02-05 ENCOUNTER — Emergency Department (HOSPITAL_COMMUNITY)

## 2024-02-05 ENCOUNTER — Emergency Department (HOSPITAL_COMMUNITY)
Admission: EM | Admit: 2024-02-05 | Discharge: 2024-02-06 | Discharge status: Against Medical Advice | Attending: Emergency Medicine | Admitting: Emergency Medicine

## 2024-02-05 ENCOUNTER — Other Ambulatory Visit: Payer: Self-pay

## 2024-02-05 DIAGNOSIS — R300 Dysuria: Secondary | ICD-10-CM | POA: Diagnosis not present

## 2024-02-05 DIAGNOSIS — R6883 Chills (without fever): Secondary | ICD-10-CM | POA: Diagnosis not present

## 2024-02-05 DIAGNOSIS — R110 Nausea: Secondary | ICD-10-CM | POA: Diagnosis not present

## 2024-02-05 DIAGNOSIS — Z5321 Procedure and treatment not carried out due to patient leaving prior to being seen by health care provider: Secondary | ICD-10-CM | POA: Insufficient documentation

## 2024-02-05 DIAGNOSIS — R1030 Lower abdominal pain, unspecified: Secondary | ICD-10-CM | POA: Diagnosis present

## 2024-02-05 DIAGNOSIS — R350 Frequency of micturition: Secondary | ICD-10-CM | POA: Insufficient documentation

## 2024-02-05 LAB — CBC
HCT: 35.9 % — ABNORMAL LOW (ref 36.0–46.0)
Hemoglobin: 12 g/dL (ref 12.0–15.0)
MCH: 32.3 pg (ref 26.0–34.0)
MCHC: 33.4 g/dL (ref 30.0–36.0)
MCV: 96.8 fL (ref 80.0–100.0)
Platelets: 291 K/uL (ref 150–400)
RBC: 3.71 MIL/uL — ABNORMAL LOW (ref 3.87–5.11)
RDW: 12 % (ref 11.5–15.5)
WBC: 6.5 K/uL (ref 4.0–10.5)
nRBC: 0 % (ref 0.0–0.2)

## 2024-02-05 LAB — URINALYSIS, ROUTINE W REFLEX MICROSCOPIC
Bilirubin Urine: NEGATIVE
Glucose, UA: NEGATIVE mg/dL
Ketones, ur: NEGATIVE mg/dL
Nitrite: POSITIVE — AB
Protein, ur: >=300 mg/dL — AB
RBC / HPF: >50 RBC/hpf (ref 0–5)
Specific Gravity, Urine: 1.016 (ref 1.005–1.030)
WBC, UA: >50 WBC/hpf (ref 0–5)
pH: 8 (ref 5.0–8.0)

## 2024-02-05 LAB — COMPREHENSIVE METABOLIC PANEL WITH GFR
ALT: 9 U/L (ref 0–44)
AST: 14 U/L — ABNORMAL LOW (ref 15–41)
Albumin: 4.4 g/dL (ref 3.5–5.0)
Alkaline Phosphatase: 26 U/L — ABNORMAL LOW (ref 38–126)
Anion gap: 10 (ref 5–15)
BUN: 24 mg/dL — ABNORMAL HIGH (ref 6–20)
CO2: 22 mmol/L (ref 22–32)
Calcium: 9.7 mg/dL (ref 8.9–10.3)
Chloride: 108 mmol/L (ref 98–111)
Creatinine, Ser: 0.78 mg/dL (ref 0.44–1.00)
GFR, Estimated: >60 mL/min (ref >60)
Glucose, Bld: 103 mg/dL — ABNORMAL HIGH (ref 70–99)
Potassium: 3.5 mmol/L (ref 3.5–5.1)
Sodium: 140 mmol/L (ref 135–145)
Total Bilirubin: 0.5 mg/dL (ref 0.0–1.2)
Total Protein: 7.2 g/dL (ref 6.5–8.1)

## 2024-02-05 LAB — HCG, SERUM, QUALITATIVE: Preg, Serum: NEGATIVE

## 2024-02-05 LAB — LIPASE, BLOOD: Lipase: 25 U/L (ref 11–51)

## 2024-02-05 NOTE — ED Triage Notes (Signed)
 Pt reports lower abdominal pain. Hx of kidney stones on the right side with a stent in place. Also reports nausea and dysuria, increased urination. +chills.

## 2024-02-05 NOTE — ED Provider Triage Note (Signed)
 Emergency Medicine Provider Triage Evaluation Note  Colleen Oneill , a 32 y.o. female  was evaluated in triage.  Pt complains of flank pain.  Patient reports history of kidney stones with a stent placed on the right side.  States that she has had some nausea as well as dysuria but no obvious hematuria.  Endorses some chills but no bodyaches.  Denies any obvious or gross hematuria.  Review of Systems  Positive: As above Negative: As above  Physical Exam  BP 137/88 (BP Location: Left Arm)   Pulse (!) 112   Temp 99.1 F (37.3 C)   Resp 18   Ht 5' 8 (1.727 m)   Wt 57.6 kg   LMP 01/05/2024 (Approximate)   SpO2 98%   BMI 19.31 kg/m  Gen:   Awake, no distress   Resp:  Normal effort  MSK:   Moves extremities without difficulty  Other:  Right-sided CVA tenderness  Medical Decision Making  Medically screening exam initiated at 6:06 PM.  Appropriate orders placed.  Colleen Oneill was informed that the remainder of the evaluation will be completed by another provider, this initial triage assessment does not replace that evaluation, and the importance of remaining in the ED until their evaluation is complete.     Amor Hyle A, PA-C 02/05/24 1807

## 2024-02-06 NOTE — ED Notes (Signed)
 Pt called for rooming with no response. Pt removed from the board.

## 2024-02-07 ENCOUNTER — Other Ambulatory Visit: Payer: Self-pay

## 2024-02-07 ENCOUNTER — Emergency Department (HOSPITAL_COMMUNITY)

## 2024-02-07 ENCOUNTER — Encounter (HOSPITAL_COMMUNITY): Payer: Self-pay

## 2024-02-07 ENCOUNTER — Emergency Department (HOSPITAL_COMMUNITY)
Admission: EM | Admit: 2024-02-07 | Discharge: 2024-02-07 | Disposition: A | Discharge status: Home / Self Care | Attending: Emergency Medicine | Admitting: Emergency Medicine

## 2024-02-07 DIAGNOSIS — N390 Urinary tract infection, site not specified: Secondary | ICD-10-CM | POA: Diagnosis not present

## 2024-02-07 DIAGNOSIS — R109 Unspecified abdominal pain: Secondary | ICD-10-CM | POA: Diagnosis present

## 2024-02-07 LAB — CBC
HCT: 35.9 % — ABNORMAL LOW (ref 36.0–46.0)
Hemoglobin: 12.1 g/dL (ref 12.0–15.0)
MCH: 32.2 pg (ref 26.0–34.0)
MCHC: 33.7 g/dL (ref 30.0–36.0)
MCV: 95.5 fL (ref 80.0–100.0)
Platelets: 304 K/uL (ref 150–400)
RBC: 3.76 MIL/uL — ABNORMAL LOW (ref 3.87–5.11)
RDW: 11.9 % (ref 11.5–15.5)
WBC: 6.1 K/uL (ref 4.0–10.5)
nRBC: 0 % (ref 0.0–0.2)

## 2024-02-07 LAB — URINALYSIS, ROUTINE W REFLEX MICROSCOPIC
Bilirubin Urine: NEGATIVE
Glucose, UA: NEGATIVE mg/dL
Ketones, ur: 5 mg/dL — AB
Nitrite: POSITIVE — AB
Protein, ur: >=300 mg/dL — AB
RBC / HPF: >50 RBC/hpf (ref 0–5)
Specific Gravity, Urine: 1.014 (ref 1.005–1.030)
WBC, UA: >50 WBC/hpf (ref 0–5)
pH: 8 (ref 5.0–8.0)

## 2024-02-07 LAB — COMPREHENSIVE METABOLIC PANEL WITH GFR
ALT: 8 U/L (ref 0–44)
AST: 16 U/L (ref 15–41)
Albumin: 4.4 g/dL (ref 3.5–5.0)
Alkaline Phosphatase: 27 U/L — ABNORMAL LOW (ref 38–126)
Anion gap: 12 (ref 5–15)
BUN: 23 mg/dL — ABNORMAL HIGH (ref 6–20)
CO2: 21 mmol/L — ABNORMAL LOW (ref 22–32)
Calcium: 9.4 mg/dL (ref 8.9–10.3)
Chloride: 107 mmol/L (ref 98–111)
Creatinine, Ser: 0.78 mg/dL (ref 0.44–1.00)
GFR, Estimated: >60 mL/min (ref >60)
Glucose, Bld: 109 mg/dL — ABNORMAL HIGH (ref 70–99)
Potassium: 3.5 mmol/L (ref 3.5–5.1)
Sodium: 140 mmol/L (ref 135–145)
Total Bilirubin: 0.7 mg/dL (ref 0.0–1.2)
Total Protein: 7.3 g/dL (ref 6.5–8.1)

## 2024-02-07 LAB — HCG, SERUM, QUALITATIVE: Preg, Serum: NEGATIVE

## 2024-02-07 LAB — LIPASE, BLOOD: Lipase: 23 U/L (ref 11–51)

## 2024-02-07 MED ORDER — SODIUM CHLORIDE 0.9 % IV BOLUS
1000.0000 mL | Freq: Once | INTRAVENOUS | Status: AC
  Administered 2024-02-07: 1000 mL via INTRAVENOUS

## 2024-02-07 MED ORDER — ONDANSETRON HCL 4 MG/2ML IJ SOLN
4.0000 mg | Freq: Once | INTRAMUSCULAR | Status: AC
  Administered 2024-02-07: 4 mg via INTRAVENOUS
  Filled 2024-02-07: qty 2

## 2024-02-07 MED ORDER — CEPHALEXIN 500 MG PO CAPS: 500.0000 mg | ORAL_CAPSULE | Freq: Three times a day (TID) | ORAL | 0 refills | Status: AC

## 2024-02-07 MED ORDER — HYDROMORPHONE HCL 1 MG/ML IJ SOLN
0.5000 mg | Freq: Once | INTRAMUSCULAR | Status: AC
  Administered 2024-02-07: 0.5 mg via INTRAVENOUS
  Filled 2024-02-07: qty 1

## 2024-02-07 MED ORDER — SODIUM CHLORIDE 0.9 % IV SOLN
1.0000 g | Freq: Once | INTRAVENOUS | Status: AC
  Administered 2024-02-07: 1 g via INTRAVENOUS
  Filled 2024-02-07: qty 10

## 2024-02-07 NOTE — ED Provider Notes (Signed)
 [place] EMERGENCY DEPARTMENT AT [facility] Provider Note   CSN: [id number] Arrival date & time: 02/07/24  1405     Patient presents with: Abdominal Pain and Nausea   Colleen Oneill is a 33 y.o. female.   HPI Patient with a known ureteral stent, history of multiple prior kidney stones presents with abdominal and flank pain.  Pain may be more on the right side, but is generally throughout the mid abdominal region. There is associated dysuria, polyuria, but no fever. Patient began to be seen yesterday but left due to family circumstances.     Prior to Admission medications   Medication Sig Start Date End Date Taking? Authorizing Provider  cephALEXin  (KEFLEX ) 500 MG capsule Take 1 capsule (500 mg total) by mouth 3 (three) times daily for 5 days. 02/07/24 02/12/24 Yes Garrick Charleston, MD  acetaminophen  (TYLENOL ) 325 MG tablet Take 2 tablets (650 mg total) by mouth every 6 (six) hours as needed for mild pain or moderate pain. 03/04/21   Arrien, Mauricio Daniel, MD  polyethylene glycol powder (GLYCOLAX /MIRALAX ) 17 GM/SCOOP powder Take 1 capful (17 g) with water by mouth daily as needed for mild constipation. 10/27/23   Cottie Donnice PARAS, MD    Allergies: Patient has no known allergies.    Review of Systems  Updated Vital Signs BP (!) 128/90 (BP Location: Right Arm)   Pulse 72   Temp 99.2 F (37.3 C) (Oral)   Resp 14   Ht 5' 8 (1.727 m)   Wt 57.6 kg   LMP 01/05/2024 (Approximate)   SpO2 99%   BMI 19.31 kg/m   Physical Exam Vitals and nursing note reviewed.  Constitutional:      General: She is not in acute distress.    Appearance: She is well-developed.  HENT:     Head: Normocephalic and atraumatic.  Eyes:     Conjunctiva/sclera: Conjunctivae normal.  Cardiovascular:     Rate and Rhythm: Regular rhythm. Tachycardia present.  Pulmonary:     Effort: Pulmonary effort is normal. No respiratory distress.     Breath sounds: Normal breath sounds. No stridor.   Abdominal:     General: There is no distension.  Skin:    General: Skin is warm and dry.  Neurological:     Mental Status: She is alert and oriented to person, place, and time.     Cranial Nerves: No cranial nerve deficit.  Psychiatric:        Mood and Affect: Mood normal.     (all labs ordered are listed, but only abnormal results are displayed) Labs Reviewed  COMPREHENSIVE METABOLIC PANEL WITH GFR - Abnormal; Notable for the following components:      Result Value   CO2 21 (*)    Glucose, Bld 109 (*)    BUN 23 (*)    Alkaline Phosphatase 27 (*)    All other components within normal limits  CBC - Abnormal; Notable for the following components:   RBC 3.76 (*)    HCT 35.9 (*)    All other components within normal limits  URINALYSIS, ROUTINE W REFLEX MICROSCOPIC - Abnormal; Notable for the following components:   APPearance CLOUDY (*)    Hgb urine dipstick MODERATE (*)    Ketones, ur 5 (*)    Protein, ur >=300 (*)    Nitrite POSITIVE (*)    Leukocytes,Ua LARGE (*)    Bacteria, UA RARE (*)    All other components within normal limits  LIPASE, BLOOD  HCG, SERUM, QUALITATIVE    EKG: None  Radiology: CT Renal Stone Study Result Date: 02/07/2024 CLINICAL DATA:  Abdominal/flank pain, stone suspected EXAM: CT ABDOMEN AND PELVIS WITHOUT CONTRAST TECHNIQUE: Multidetector CT imaging of the abdomen and pelvis was performed following the standard protocol without IV contrast. RADIATION DOSE REDUCTION: This exam was performed according to the departmental dose-optimization program which includes automated exposure control, adjustment of the mA and/or kV according to patient size and/or use of iterative reconstruction technique. COMPARISON:  CT 2 days ago 02/05/2024, also contrast-enhanced exam 12/29/2023 FINDINGS: Lower chest: No acute findings. Hepatobiliary: Stable numerous hepatic cysts, unchanged from prior. Question layering sludge in the gallbladder. No pericholecystic  inflammation or calcified stone. Pancreas: No ductal dilatation or inflammation. Spleen: Normal in size without focal abnormality. Adrenals/Urinary Tract: No adrenal nodule. Enlarged polycystic kidneys as before. There is no left hydronephrosis. Punctate left intrarenal calculi, again seen. No evidence of ureteral stone. No left perinephric stranding. Right nephroureteral stent in place, pigtail in the right upper pole calyx and urinary bladder. 14 mm stone adjacent to the right upper pole pigtail. No stones along the course of the stent. The distal pigtail is associated with a 2.8 cm bladder stone. Moderate perivesicular fat stranding and edema. Stomach/Bowel: Stomach is within normal limits. Appendix appears normal. No evidence of bowel wall thickening, distention, or inflammatory changes. Vascular/Lymphatic: Normal caliber abdominal aorta. No bulky abdominopelvic adenopathy. Reproductive: Uterus and bilateral adnexa are unremarkable. Other: Stranding in the pelvis adjacent to the bladder. Trace pelvic free fluid. No free air. Musculoskeletal: There are no acute or suspicious osseous abnormalities. IMPRESSION: 1. Nonobstructing left renal calculi. No hydronephrosis or explanation for left flank pain. 2. Right nephroureteral stent in place. 14 mm stone adjacent to the right upper pole pigtail. The distal pigtail is associated with a 2.8 cm bladder stone. 3. Moderate perivesicular fat stranding and edema, can be seen with cystitis. 4. Polycystic kidneys and liver. Electronically Signed   By: Andrea Gasman M.D.   On: 02/07/2024 17:07   CT Renal Stone Study Result Date: 02/05/2024 CLINICAL DATA:  Abdominal pain, flank pain on right side EXAM: CT ABDOMEN AND PELVIS WITHOUT CONTRAST TECHNIQUE: Multidetector CT imaging of the abdomen and pelvis was performed following the standard protocol without IV contrast. RADIATION DOSE REDUCTION: This exam was performed according to the departmental dose-optimization program  which includes automated exposure control, adjustment of the mA and/or kV according to patient size and/or use of iterative reconstruction technique. COMPARISON:  12/29/2023 FINDINGS: Lower chest: No acute findings Hepatobiliary: Numerous hepatic cysts throughout the liver, stable since prior study. Gallbladder unremarkable. No biliary ductal dilatation. Pancreas: No focal abnormality or ductal dilatation. Spleen: No focal abnormality.  Normal size. Adrenals/Urinary Tract: Enlarged kidneys bilaterally with innumerable bilateral renal cysts compatible with polycystic kidney disease, unchanged. Right ureteral stent again noted in stable position. No hydronephrosis. Calcifications noted along the proximal pigtail of the ureteral stent in the right upper pole and along the distal pigtail in the bladder. This is unchanged since prior study. Punctate nonobstructing stones in the mid and lower pole of the left kidney. Stomach/Bowel: Normal appendix. Stomach, large and small bowel grossly unremarkable. Vascular/Lymphatic: No evidence of aneurysm or adenopathy. Reproductive: Uterus and adnexa unremarkable.  No mass. Other: No free fluid or free air. Musculoskeletal: No acute bony abnormality. IMPRESSION: Right ureteral stent remains in place with calcifications along the proximal pigtail in the right upper pole and along the distal pigtail in the bladder, unchanged. No hydronephrosis. Punctate  left nephrolithiasis.  No hydronephrosis. Changes of autosomal dominant polycystic kidney disease. Electronically Signed   By: Franky Crease M.D.   On: 02/05/2024 21:27     Procedures   Medications Ordered in the ED  cefTRIAXone  (ROCEPHIN ) 1 g in sodium chloride  0.9 % 100 mL IVPB (has no administration in time range)  HYDROmorphone  (DILAUDID ) injection 0.5 mg (0.5 mg Intravenous Given 02/07/24 1856)  ondansetron  (ZOFRAN ) injection 4 mg (4 mg Intravenous Given 02/07/24 1857)  sodium chloride  0.9 % bolus 1,000 mL (1,000 mLs  Intravenous New Bag/Given 02/07/24 1859)                                    Medical Decision Making Adult female with history of kidney stones, ureteral stent presents with pain.  Concern for stent dysfunction versus stone known versus infection, bacteremia, sepsis. Labs CT monitoring fluids and analgesics all ordered. Cardiac 105 sinus tach abnormal Pulse ox 100% room air normal  Amount and/or Complexity of Data Reviewed External Data Reviewed: notes. Labs: ordered. Decision-making details documented in ED Course. Radiology: ordered and independent interpretation performed. Decision-making details documented in ED Course.  Risk Prescription drug management.   7:30 PM Urinalysis finally available, consistent with infection with nitrate, leukocyte positive counts.  She has no leukocytosis, no fever, is not hypotensive, no evidence for bacteremia, sepsis. We discussed all findings including CT results as well CT consistent with that of a few days ago and prior, stent in place, previously recommended stones present.  Patient notes that she was not able to follow-up with urology after the stent was initially placed.  Now, with no evidence of bacteremia, sepsis, no distress, patient will receive IV antibiotics, will be discharged with oral equivalent to call urology for close outpatient follow-up tomorrow.  Patient voices understanding of recommendation of the importance of this.     Final diagnoses:  Lower urinary tract infectious disease    ED Discharge Orders          Ordered    cephALEXin  (KEFLEX ) 500 MG capsule  3 times daily        02/07/24 1929               Garrick Charleston, MD 02/07/24 [phone]

## 2024-02-07 NOTE — Discharge Instructions (Addendum)
 It is very important that you call the urology office tomorrow.  Please let them know that you had a stent placed in the hospital previously, and are currently being treated for infection, requiring follow-up.  Return here for concerning changes in your condition.

## 2024-02-07 NOTE — ED Triage Notes (Signed)
 Pt c/o lower abdomen, chills and nausea. Pt states she was here the other day but had to leave d/t emergency. Pt states she has kidney stones w/stent in place.

## 2024-02-07 NOTE — ED Notes (Signed)
 Patient transported to CT

## 2024-02-07 NOTE — ED Notes (Signed)
 Patient ambulatory to bathroom. Pt given urine specimen cup and asked to provide a sample if possible.

## 2024-02-07 NOTE — ED Notes (Signed)
Unable to provide urine

## 2024-04-20 ENCOUNTER — Encounter (HOSPITAL_COMMUNITY): Payer: Self-pay

## 2024-04-20 ENCOUNTER — Other Ambulatory Visit: Payer: Self-pay

## 2024-04-20 ENCOUNTER — Inpatient Hospital Stay (HOSPITAL_COMMUNITY)
Admission: EM | Admit: 2024-04-20 | Discharge: 2024-04-22 | DRG: 694 | Disposition: A | Discharge status: Home / Self Care | Attending: Internal Medicine | Admitting: Internal Medicine

## 2024-04-20 ENCOUNTER — Emergency Department (HOSPITAL_COMMUNITY)

## 2024-04-20 DIAGNOSIS — Z8419 Family history of other disorders of kidney and ureter: Secondary | ICD-10-CM

## 2024-04-20 DIAGNOSIS — N209 Urinary calculus, unspecified: Secondary | ICD-10-CM | POA: Diagnosis present

## 2024-04-20 DIAGNOSIS — Q613 Polycystic kidney, unspecified: Secondary | ICD-10-CM

## 2024-04-20 DIAGNOSIS — Z833 Family history of diabetes mellitus: Secondary | ICD-10-CM

## 2024-04-20 DIAGNOSIS — K5900 Constipation, unspecified: Secondary | ICD-10-CM | POA: Diagnosis present

## 2024-04-20 DIAGNOSIS — Z23 Encounter for immunization: Secondary | ICD-10-CM

## 2024-04-20 DIAGNOSIS — N210 Calculus in bladder: Secondary | ICD-10-CM | POA: Diagnosis present

## 2024-04-20 DIAGNOSIS — N200 Calculus of kidney: Principal | ICD-10-CM | POA: Diagnosis present

## 2024-04-20 DIAGNOSIS — R739 Hyperglycemia, unspecified: Secondary | ICD-10-CM | POA: Diagnosis present

## 2024-04-20 DIAGNOSIS — R1030 Lower abdominal pain, unspecified: Principal | ICD-10-CM

## 2024-04-20 DIAGNOSIS — N390 Urinary tract infection, site not specified: Secondary | ICD-10-CM | POA: Diagnosis present

## 2024-04-20 LAB — COMPREHENSIVE METABOLIC PANEL WITH GFR
ALT: 7 U/L (ref 0–44)
AST: 20 U/L (ref 15–41)
Albumin: 4.9 g/dL (ref 3.5–5.0)
Alkaline Phosphatase: 38 U/L (ref 38–126)
Anion gap: 10 (ref 5–15)
BUN: 22 mg/dL — ABNORMAL HIGH (ref 6–20)
CO2: 27 mmol/L (ref 22–32)
Calcium: 10 mg/dL (ref 8.9–10.3)
Chloride: 104 mmol/L (ref 98–111)
Creatinine, Ser: 0.75 mg/dL (ref 0.44–1.00)
GFR, Estimated: >60 mL/min (ref >60)
Glucose, Bld: 130 mg/dL — ABNORMAL HIGH (ref 70–99)
Potassium: 3.9 mmol/L (ref 3.5–5.1)
Sodium: 142 mmol/L (ref 135–145)
Total Bilirubin: 0.5 mg/dL (ref 0.0–1.2)
Total Protein: 7.9 g/dL (ref 6.5–8.1)

## 2024-04-20 LAB — URINALYSIS, ROUTINE W REFLEX MICROSCOPIC
Bilirubin Urine: NEGATIVE
Glucose, UA: NEGATIVE mg/dL
Ketones, ur: NEGATIVE mg/dL
Nitrite: NEGATIVE
Protein, ur: >=300 mg/dL — AB
RBC / HPF: >50 RBC/hpf (ref 0–5)
Specific Gravity, Urine: 1.014 (ref 1.005–1.030)
WBC, UA: >50 WBC/hpf (ref 0–5)
pH: 7 (ref 5.0–8.0)

## 2024-04-20 LAB — CBC
HCT: 39.6 % (ref 36.0–46.0)
Hemoglobin: 12.7 g/dL (ref 12.0–15.0)
MCH: 31.3 pg (ref 26.0–34.0)
MCHC: 32.1 g/dL (ref 30.0–36.0)
MCV: 97.5 fL (ref 80.0–100.0)
Platelets: 302 K/uL (ref 150–400)
RBC: 4.06 MIL/uL (ref 3.87–5.11)
RDW: 11.9 % (ref 11.5–15.5)
WBC: 7.3 K/uL (ref 4.0–10.5)
nRBC: 0 % (ref 0.0–0.2)

## 2024-04-20 LAB — HCG, SERUM, QUALITATIVE: Preg, Serum: NEGATIVE

## 2024-04-20 LAB — LIPASE, BLOOD: Lipase: 24 U/L (ref 11–51)

## 2024-04-20 MED ORDER — KETOROLAC TROMETHAMINE 30 MG/ML IJ SOLN
30.0000 mg | Freq: Once | INTRAMUSCULAR | Status: AC
  Administered 2024-04-20: 30 mg via INTRAVENOUS
  Filled 2024-04-20: qty 1

## 2024-04-20 MED ORDER — MORPHINE SULFATE (PF) 4 MG/ML IV SOLN
4.0000 mg | Freq: Once | INTRAVENOUS | Status: AC
  Administered 2024-04-20: 4 mg via INTRAVENOUS
  Filled 2024-04-20: qty 1

## 2024-04-20 MED ORDER — SODIUM CHLORIDE 0.9 % IV BOLUS
500.0000 mL | Freq: Once | INTRAVENOUS | Status: AC
  Administered 2024-04-20: 500 mL via INTRAVENOUS

## 2024-04-20 NOTE — ED Provider Notes (Addendum)
 [place] EMERGENCY DEPARTMENT AT [facility] Provider Note   CSN: [id number] Arrival date & time: 04/20/24  1851     Patient presents with: Abdominal Pain and Dysuria   Colleen Oneill is a 33 y.o. female.   HPI   33 year old female past medical history of kidney stones, right ureteral stent placed in 2022 by Dr. Rosalind, frequent UTIs who has been lost to outpatient follow-up secondary to insurance issues and compliance presents emergency department ongoing abdominal pain/pelvic pain.  Was last seen 2 months ago, treated for urinary tract infection, plan for outpatient urology follow-up but she has not presented to the office.  States that she at 1 point was without insurance and could not afford office visits.  She denies any fever, vomiting, diarrhea.  Prior to Admission medications   Medication Sig Start Date End Date Taking? Authorizing Provider  acetaminophen  (TYLENOL ) 325 MG tablet Take 2 tablets (650 mg total) by mouth every 6 (six) hours as needed for mild pain or moderate pain. 03/04/21   Arrien, Mauricio Daniel, MD  polyethylene glycol powder (GLYCOLAX /MIRALAX ) 17 GM/SCOOP powder Take 1 capful (17 g) with water by mouth daily as needed for mild constipation. 10/27/23   Cottie Donnice PARAS, MD    Allergies: Patient has no known allergies.    Review of Systems  Constitutional:  Positive for fatigue. Negative for fever.  Respiratory:  Negative for shortness of breath.   Cardiovascular:  Negative for chest pain.  Gastrointestinal:  Positive for abdominal pain. Negative for diarrhea and vomiting.  Genitourinary:  Positive for frequency. Negative for dysuria, vaginal bleeding and vaginal discharge.  Skin:  Negative for rash.  Neurological:  Negative for headaches.    Updated Vital Signs BP 130/84   Pulse 76   Temp 98.3 F (36.8 C)   Resp 16   Ht 5' 8 (1.727 m)   Wt 57.2 kg   LMP 03/12/2024 (Approximate)   SpO2 99%   BMI 19.16 kg/m   Physical Exam Vitals  and nursing note reviewed.  Constitutional:      General: She is not in acute distress.    Appearance: Normal appearance. She is not ill-appearing.  HENT:     Head: Normocephalic.     Mouth/Throat:     Mouth: Mucous membranes are moist.  Cardiovascular:     Rate and Rhythm: Normal rate.  Pulmonary:     Effort: Pulmonary effort is normal. No respiratory distress.  Abdominal:     General: Bowel sounds are normal.     Palpations: Abdomen is soft.     Tenderness: There is generalized abdominal tenderness. There is no guarding or rebound.  Skin:    General: Skin is warm.  Neurological:     Mental Status: She is alert and oriented to person, place, and time. Mental status is at baseline.  Psychiatric:        Mood and Affect: Mood normal.     (all labs ordered are listed, but only abnormal results are displayed) Labs Reviewed  COMPREHENSIVE METABOLIC PANEL WITH GFR - Abnormal; Notable for the following components:      Result Value   Glucose, Bld 130 (*)    BUN 22 (*)    All other components within normal limits  URINALYSIS, ROUTINE W REFLEX MICROSCOPIC - Abnormal; Notable for the following components:   APPearance CLOUDY (*)    Hgb urine dipstick MODERATE (*)    Protein, ur >=300 (*)    Leukocytes,Ua LARGE (*)  Bacteria, UA RARE (*)    All other components within normal limits  LIPASE, BLOOD  CBC  HCG, SERUM, QUALITATIVE    EKG: None  Radiology: CT Renal Stone Study Result Date: 04/20/2024 EXAM: CT ABDOMEN AND PELVIS WITHOUT CONTRAST 04/20/2024 10:22:29 PM TECHNIQUE: CT of the abdomen and pelvis was performed without the administration of intravenous contrast. Multiplanar reformatted images are provided for review. Automated exposure control, iterative reconstruction, and/or weight-based adjustment of the mA/kV was utilized to reduce the radiation dose to as low as reasonably achievable. COMPARISON: 02/07/2024 CLINICAL HISTORY: Abdominal/flank pain, stone suspected. Per  triage note: Pt reports lower abdominal pain for the last month. Pt reports hx of kidney stones and being treated for UTI, pt reports dysuria that has come back in the last week. Pt reports she has not been able to afford the visit to the urologist. FINDINGS: LOWER CHEST: No acute abnormality. LIVER: Innumerable hepatic cysts are stable and appear benign. GALLBLADDER AND BILE DUCTS: Gallbladder is unremarkable. No biliary ductal dilatation. SPLEEN: No acute abnormality. PANCREAS: No acute abnormality. ADRENAL GLANDS: No acute abnormality. KIDNEYS, URETERS AND BLADDER: Numerous bilateral renal cysts are stable. Large right upper pole staghorn calculus, stable . Scattered small left renal stones, stable . Right ureteral stent is in place with large bladder stone. No hydronephrosis bilaterally. No perinephric or periureteral stranding. GI AND BOWEL: Stomach demonstrates no acute abnormality. There is no bowel obstruction. PERITONEUM AND RETROPERITONEUM: No ascites. No free air. VASCULATURE: Aorta is normal in caliber. LYMPH NODES: No lymphadenopathy. REPRODUCTIVE ORGANS: 3.9 cm left ovarian cyst. BONES AND SOFT TISSUES: No acute osseous abnormality. No focal soft tissue abnormality. IMPRESSION: 1. Large right upper pole staghorn calculus and scattered small left renal stones without hydronephrosis bilaterally. 2. Right ureteral stent in place with large bladder stone. 3. 3.9 cm left ovarian simple-appearing ovarian cyst; in premenopausal patients, no follow-up is recommended. In postmenopausal patients, recommend pelvic ultrasound follow-up in 612 months. Electronically signed by: Franky Crease MD 04/20/2024 10:28 PM EDT RP Workstation: [id number]     Procedures   Medications Ordered in the ED  ketorolac  (TORADOL ) 30 MG/ML injection 30 mg (30 mg Intravenous Given 04/20/24 2236)  sodium chloride  0.9 % bolus 500 mL (500 mLs Intravenous New Bag/Given 04/20/24 2236)                                    Medical  Decision Making Amount and/or Complexity of Data Reviewed Labs: ordered. Radiology: ordered.  Risk Prescription drug management.   33 year old female presents emergency department with chronic ongoing lower abdominal pain.  Noted to have a ureteral stent in place from 2022.  Has been seen in the ER multiple times for similar complaints, usually diagnosed with urinary tract infection and placed on antibiotics and plan for outpatient urology follow-up.  3 months ago Dr. Shane saw the patient here in the ER and had her scheduled for office visit/procedure however she did not show.  Patient admits to insurance difficulties and being unable to afford co-pay for office.  Was tachycardic in triage but normal vitals on my evaluation.  Abdomen is slightly tender but benign.  Blood work shows normal kidney function, abdominal labs are normal, no leukocytosis, urinalysis has a large amount of leukocytes/white blood cells but also high number of squamous cells.  CT scan shows continued right ureteral stent with large staghorn calculus as well as bladder stone.  No obstructive  or acute process.  Given her ongoing symptoms with a ureteral stent from 2022 that needs evaluation for removal I consulted urology.  Spoke with Dr. Shane who is familiar with the patient.  He does not believe that this procedure can get done in a timely manner but he also does not want the patient to leave or lose them to outpatient follow-up.  He recommends for the patient to wait here in the ER for evaluation by urology so that he can further assist the patient in having the necessary stent removal procedure.  Patient updated about this plan.  She is happy to wait.  Stable at time of this conversation.  Dr. Raford aware. Patient pending urology evaluation and recommendations.   Final diagnoses:  Lower abdominal pain    ED Discharge Orders     None          Bari Roxie HERO, DO 04/20/24 2315    Bari Roxie HERO, DO 04/20/24 2334

## 2024-04-20 NOTE — ED Notes (Signed)
 Pt to CT

## 2024-04-20 NOTE — ED Triage Notes (Signed)
 Pt reports lower abdominal pain for the last month. Pt reports hx of kidney stones and being treated for UTI, pt reports dysuria that has come back in the last week. Pt reports she has not been able to afford the visit to the urologist.

## 2024-04-21 ENCOUNTER — Inpatient Hospital Stay (HOSPITAL_COMMUNITY): Admitting: Anesthesiology

## 2024-04-21 ENCOUNTER — Encounter (HOSPITAL_COMMUNITY): Admission: EM | Disposition: A | Payer: Self-pay | Source: Home / Self Care | Attending: Internal Medicine

## 2024-04-21 ENCOUNTER — Encounter (HOSPITAL_COMMUNITY): Payer: Self-pay | Admitting: Internal Medicine

## 2024-04-21 DIAGNOSIS — Z833 Family history of diabetes mellitus: Secondary | ICD-10-CM | POA: Diagnosis not present

## 2024-04-21 DIAGNOSIS — R739 Hyperglycemia, unspecified: Secondary | ICD-10-CM | POA: Diagnosis present

## 2024-04-21 DIAGNOSIS — N200 Calculus of kidney: Secondary | ICD-10-CM | POA: Diagnosis present

## 2024-04-21 DIAGNOSIS — Z8419 Family history of other disorders of kidney and ureter: Secondary | ICD-10-CM | POA: Diagnosis not present

## 2024-04-21 DIAGNOSIS — N210 Calculus in bladder: Secondary | ICD-10-CM

## 2024-04-21 DIAGNOSIS — N209 Urinary calculus, unspecified: Secondary | ICD-10-CM | POA: Diagnosis present

## 2024-04-21 DIAGNOSIS — Z23 Encounter for immunization: Secondary | ICD-10-CM | POA: Diagnosis not present

## 2024-04-21 DIAGNOSIS — Q613 Polycystic kidney, unspecified: Secondary | ICD-10-CM | POA: Diagnosis not present

## 2024-04-21 DIAGNOSIS — K5900 Constipation, unspecified: Secondary | ICD-10-CM | POA: Diagnosis present

## 2024-04-21 DIAGNOSIS — N390 Urinary tract infection, site not specified: Secondary | ICD-10-CM | POA: Diagnosis present

## 2024-04-21 HISTORY — PX: CYSTOSCOPY WITH LITHOLAPAXY: SHX1425

## 2024-04-21 SURGERY — CYSTOSCOPY, WITH BLADDER CALCULUS LITHOLAPAXY
Anesthesia: General | Site: Bladder

## 2024-04-21 MED ORDER — ACETAMINOPHEN 650 MG RE SUPP: 650.0000 mg | Freq: Four times a day (QID) | RECTAL | Status: DC | PRN

## 2024-04-21 MED ORDER — FENTANYL CITRATE (PF) 100 MCG/2ML IJ SOLN
INTRAMUSCULAR | Status: AC
  Filled 2024-04-21: qty 2

## 2024-04-21 MED ORDER — PROPOFOL 10 MG/ML IV BOLUS
INTRAVENOUS | Status: DC | PRN
Start: 2024-04-21 — End: 2024-04-21
  Administered 2024-04-21: 150 mg via INTRAVENOUS

## 2024-04-21 MED ORDER — CEFTRIAXONE SODIUM 1 G IJ SOLR
1.0000 g | Freq: Once | INTRAMUSCULAR | Status: AC
  Administered 2024-04-21: 1 g via INTRAVENOUS
  Filled 2024-04-21: qty 10

## 2024-04-21 MED ORDER — ACETAMINOPHEN 325 MG PO TABS: 650.0000 mg | ORAL_TABLET | Freq: Four times a day (QID) | ORAL | Status: DC | PRN

## 2024-04-21 MED ORDER — ONDANSETRON HCL 4 MG PO TABS: 4.0000 mg | ORAL_TABLET | Freq: Four times a day (QID) | ORAL | Status: DC | PRN

## 2024-04-21 MED ORDER — ENSURE PLUS HIGH PROTEIN PO LIQD
237.0000 mL | Freq: Two times a day (BID) | ORAL | Status: DC
  Administered 2024-04-22 (×2): 237 mL via ORAL

## 2024-04-21 MED ORDER — AMISULPRIDE (ANTIEMETIC) 5 MG/2ML IV SOLN: 10.0000 mg | Freq: Once | INTRAVENOUS | Status: DC | PRN

## 2024-04-21 MED ORDER — FENTANYL CITRATE (PF) 100 MCG/2ML IJ SOLN
INTRAMUSCULAR | Status: DC | PRN
  Administered 2024-04-21: 50 ug via INTRAVENOUS
  Administered 2024-04-21: 25 ug via INTRAVENOUS
  Administered 2024-04-21: 50 ug via INTRAVENOUS

## 2024-04-21 MED ORDER — ACETAMINOPHEN 500 MG PO TABS
1000.0000 mg | ORAL_TABLET | Freq: Once | ORAL | Status: AC
  Administered 2024-04-21: 1000 mg via ORAL
  Filled 2024-04-21: qty 2

## 2024-04-21 MED ORDER — INFLUENZA VIRUS VACC SPLIT PF (FLUZONE) 0.5 ML IM SUSY: 0.5000 mL | PREFILLED_SYRINGE | INTRAMUSCULAR | Status: DC

## 2024-04-21 MED ORDER — DEXAMETHASONE SODIUM PHOSPHATE 4 MG/ML IJ SOLN
INTRAMUSCULAR | Status: DC | PRN
  Administered 2024-04-21: 10 mg via INTRAVENOUS

## 2024-04-21 MED ORDER — PHENYLEPHRINE 80 MCG/ML (10ML) SYRINGE FOR IV PUSH (FOR BLOOD PRESSURE SUPPORT)
PREFILLED_SYRINGE | INTRAVENOUS | Status: AC
  Filled 2024-04-21: qty 10

## 2024-04-21 MED ORDER — KETOROLAC TROMETHAMINE 30 MG/ML IJ SOLN
INTRAMUSCULAR | Status: AC
  Filled 2024-04-21: qty 1

## 2024-04-21 MED ORDER — ONDANSETRON HCL 4 MG/2ML IJ SOLN: 4.0000 mg | Freq: Once | INTRAMUSCULAR | Status: DC | PRN

## 2024-04-21 MED ORDER — POLYETHYLENE GLYCOL 3350 17 G PO PACK
17.0000 g | PACK | Freq: Every day | ORAL | Status: DC | PRN
  Administered 2024-04-21 – 2024-04-22 (×2): 17 g via ORAL
  Filled 2024-04-21 (×2): qty 1

## 2024-04-21 MED ORDER — LACTATED RINGERS IV SOLN: INTRAVENOUS | Status: DC

## 2024-04-21 MED ORDER — MIDAZOLAM HCL 5 MG/5ML IJ SOLN
INTRAMUSCULAR | Status: DC | PRN
  Administered 2024-04-21: 2 mg via INTRAVENOUS

## 2024-04-21 MED ORDER — EPHEDRINE 5 MG/ML INJ
INTRAVENOUS | Status: AC
  Filled 2024-04-21: qty 5

## 2024-04-21 MED ORDER — CHLORHEXIDINE GLUCONATE 0.12 % MT SOLN
15.0000 mL | Freq: Once | OROMUCOSAL | Status: AC
  Administered 2024-04-21: 15 mL via OROMUCOSAL

## 2024-04-21 MED ORDER — EPHEDRINE SULFATE (PRESSORS) 25 MG/5ML IV SOSY
PREFILLED_SYRINGE | INTRAVENOUS | Status: DC | PRN
  Administered 2024-04-21: 5 mg via INTRAVENOUS
  Administered 2024-04-21: 10 mg via INTRAVENOUS

## 2024-04-21 MED ORDER — MIDAZOLAM HCL 2 MG/2ML IJ SOLN
INTRAMUSCULAR | Status: AC
Start: 2024-04-21 — End: 2024-04-21
  Filled 2024-04-21: qty 2

## 2024-04-21 MED ORDER — PROCHLORPERAZINE EDISYLATE 10 MG/2ML IJ SOLN
5.0000 mg | INTRAMUSCULAR | Status: DC | PRN
  Administered 2024-04-21: 5 mg via INTRAVENOUS
  Filled 2024-04-21: qty 2

## 2024-04-21 MED ORDER — SODIUM CHLORIDE 0.9 % IR SOLN
Status: DC | PRN
  Administered 2024-04-21: 3000 mL
  Administered 2024-04-21: 6000 mL
  Administered 2024-04-21: 3000 mL

## 2024-04-21 MED ORDER — KETOROLAC TROMETHAMINE 30 MG/ML IJ SOLN: 30.0000 mg | Freq: Once | INTRAMUSCULAR | Status: DC | PRN

## 2024-04-21 MED ORDER — LIDOCAINE HCL (CARDIAC) PF 100 MG/5ML IV SOSY
PREFILLED_SYRINGE | INTRAVENOUS | Status: DC | PRN
  Administered 2024-04-21: 50 mg via INTRAVENOUS

## 2024-04-21 MED ORDER — OXYCODONE HCL 5 MG PO TABS: 5.0000 mg | ORAL_TABLET | Freq: Once | ORAL | Status: DC | PRN

## 2024-04-21 MED ORDER — MORPHINE SULFATE (PF) 4 MG/ML IV SOLN
4.0000 mg | INTRAVENOUS | Status: DC | PRN
  Administered 2024-04-21 – 2024-04-22 (×7): 4 mg via INTRAVENOUS
  Filled 2024-04-21 (×7): qty 1

## 2024-04-21 MED ORDER — STERILE WATER FOR IRRIGATION IR SOLN
Status: DC | PRN
  Administered 2024-04-21: 3000 mL

## 2024-04-21 MED ORDER — PHENYLEPHRINE HCL (PRESSORS) 10 MG/ML IV SOLN
INTRAVENOUS | Status: DC | PRN
Start: 2024-04-21 — End: 2024-04-21
  Administered 2024-04-21: 160 ug via INTRAVENOUS

## 2024-04-21 MED ORDER — ONDANSETRON HCL 4 MG/2ML IJ SOLN
4.0000 mg | Freq: Four times a day (QID) | INTRAMUSCULAR | Status: DC | PRN
  Administered 2024-04-21 (×2): 4 mg via INTRAVENOUS
  Filled 2024-04-21 (×2): qty 2

## 2024-04-21 MED ORDER — ONDANSETRON HCL 4 MG/2ML IJ SOLN
INTRAMUSCULAR | Status: DC | PRN
  Administered 2024-04-21: 4 mg via INTRAVENOUS

## 2024-04-21 MED ORDER — SODIUM CHLORIDE 0.9 % IV SOLN
INTRAVENOUS | Status: AC
  Filled 2024-04-21: qty 20

## 2024-04-21 MED ORDER — SODIUM CHLORIDE 0.9 % IV SOLN
1.0000 g | INTRAVENOUS | Status: DC
  Administered 2024-04-21 – 2024-04-22 (×2): 1 g via INTRAVENOUS
  Filled 2024-04-21 (×2): qty 10

## 2024-04-21 MED ORDER — HYDROMORPHONE HCL 1 MG/ML IJ SOLN: 0.2500 mg | INTRAMUSCULAR | Status: DC | PRN

## 2024-04-21 MED ORDER — OXYCODONE HCL 5 MG/5ML PO SOLN: 5.0000 mg | Freq: Once | ORAL | Status: DC | PRN

## 2024-04-21 MED ORDER — MEPERIDINE HCL 100 MG/ML IJ SOLN: 6.2500 mg | INTRAMUSCULAR | Status: DC | PRN

## 2024-04-21 SURGICAL SUPPLY — 12 items
BAG URO CATCHER STRL LF (MISCELLANEOUS) ×1 IMPLANT
CLOTH BEACON ORANGE TIMEOUT ST (SAFETY) ×1 IMPLANT
GLOVE BIO SURGEON STRL SZ8 (GLOVE) ×1 IMPLANT
GOWN STRL REUS W/ TWL XL LVL3 (GOWN DISPOSABLE) ×1 IMPLANT
KIT TURNOVER KIT A (KITS) ×1 IMPLANT
LASER FIB FLEXIVA PULSE ID 550 (Laser) IMPLANT
LASER FIB FLEXIVA PULSE ID 910 (Laser) IMPLANT
MANIFOLD NEPTUNE II (INSTRUMENTS) ×1 IMPLANT
PACK CYSTO (CUSTOM PROCEDURE TRAY) ×1 IMPLANT
SYRINGE TOOMEY IRRIG 70ML (MISCELLANEOUS) IMPLANT
TUBING CONNECTING 10 (TUBING) IMPLANT
TUBING UROLOGY SET (TUBING) ×1 IMPLANT

## 2024-04-21 NOTE — Progress Notes (Addendum)
 Patient off unit to surgery. Informed pacu staff that chg / teeth brush were not completed on unit. Verbalized understanding.

## 2024-04-21 NOTE — H&P (Signed)
 History and Physical    Patient: Colleen Oneill FMW:[id number] DOB: 06-02-1991 DOA: 04/20/2024 DOS: the patient was seen and examined on 04/21/2024 PCP: Pcp, No  Patient coming from: Home  Chief Complaint:  Chief Complaint  Patient presents with   Abdominal Pain   Dysuria   HPI: Colleen Oneill is a 33 y.o. female with medical history significant of chronic anemia, dousing emesis, polycystic kidney disease, history of pyelonephritis, history of frequent UTI, history of sepsis who underwent urinary stent placement in 2022 by Dr. Rosalind due to obstructive pyelonephritis who was lost to follow-up due to health insurance issues and compliance who presented to emergency department with lower domino pain that has been getting progressively worse for the past month associated with dysuria and frequency for several days.  No current flank pain or hematuria.  Positive night sweats, but no fever.  She has have persistent nausea, but no emesis.  She denied fever, chills, rhinorrhea, sore throat, wheezing or hemoptysis.  No chest pain, palpitations, diaphoresis, PND, orthopnea or pitting edema of the lower extremities.  No abdominal pain, nausea, emesis, diarrhea, constipation, melena or hematochezia. No polyuria, polydipsia, polyphagia or blurred vision.   Lab work: Urinalysis was cloudy with moderate hemoglobin, large leukocyte esterase, greater than 300 mg/dL of protein with microscopic examination showing greater than 50 RBC, greater than 50 WBCs and rare bacteria.  Urine culture is pending.  CBC was normal.  CMP showed a glucose of 130 and BUN was 22 mg/dL, but was otherwise unremarkable.  Normal lipase normal serum pregnancy test.  Imaging: CT renal study show a large right upper pole staghorn calculus and scattered small left renal stones without hydronephrosis bilaterally.  Right ureteral stent in place with large bladder stone.  3.9 cm left ovarian simple cyst with no follow-up recommended since the  patient is premenopausal.  ED course: Initial vital signs were temperature 98.7 F, pulse 92, respiration 18, BP 135/91 mmHg and O2 sat 100% on room air.  The patient received ceftriaxone  1 g IVPB, ketorolac  30 mg IVP and normal saline 500 mL bolus.   Review of Systems: As mentioned in the history of present illness. All other systems reviewed and are negative. Past Medical History:  Diagnosis Date   Medical history non-contributory    Polycystic kidney disease    Past Surgical History:  Procedure Laterality Date   CESAREAN SECTION     CESAREAN SECTION N/A 08/09/2012   Procedure: Repeat CESAREAN SECTION of baby boy  at 0431  APGAR 9/9;  Surgeon: Aida DELENA Na, MD;  Location: [facility];  Service: Obstetrics;  Laterality: N/A;   CESAREAN SECTION N/A 08/23/2013   Procedure: CESAREAN SECTION;  Surgeon: Carlin DELENA Centers, MD;  Location: [facility];  Service: Obstetrics;  Laterality: N/A;   CYSTOSCOPY/URETEROSCOPY/HOLMIUM LASER/STENT PLACEMENT Right 03/02/2021   Procedure: CYSTOSCOPY; RIGHT RETROGRADE PYELOGRAM; RIGTH URETERAL STENT PLACEMENT;  Surgeon: Rosalind Zachary NOVAK, MD;  Location: [facility];  Service: Urology;  Laterality: Right;   Social History:  reports that she has never smoked. She has never used smokeless tobacco. She reports that she does not drink alcohol and does not use drugs.  No Known Allergies  Family History  Problem Relation Age of Onset   Kidney disease Father    Diabetes Maternal Grandmother    Other Neg Hx     Prior to Admission medications   Medication Sig Start Date End Date Taking? Authorizing Provider  acetaminophen  (TYLENOL ) 325 MG tablet Take 2 tablets (650 mg  total) by mouth every 6 (six) hours as needed for mild pain or moderate pain. 03/04/21   Arrien, Mauricio Daniel, MD  polyethylene glycol powder (GLYCOLAX /MIRALAX ) 17 GM/SCOOP powder Take 1 capful (17 g) with water by mouth daily as needed for mild constipation. 10/27/23   Cottie Donnice PARAS, MD    Physical  Exam: Vitals:   04/21/24 0500 04/21/24 0600 04/21/24 0730 04/21/24 0732  BP: 101/64 102/69 (!) 130/104   Pulse: (!) 59 (!) 53 75   Resp: 12 14 17    Temp:    97.8 F (36.6 C)  TempSrc:    Oral  SpO2: 98% 99% 99%   Weight:      Height:       Physical Exam Vitals and nursing note reviewed.  Constitutional:      General: She is awake. She is not in acute distress.    Appearance: She is ill-appearing.  HENT:     Head: Normocephalic.     Nose: No rhinorrhea.     Mouth/Throat:     Mouth: Mucous membranes are dry.  Eyes:     General: No scleral icterus.    Pupils: Pupils are equal, round, and reactive to light.  Neck:     Vascular: No JVD.  Cardiovascular:     Rate and Rhythm: Normal rate and regular rhythm.  Pulmonary:     Effort: Pulmonary effort is normal.     Breath sounds: No wheezing, rhonchi or rales.  Abdominal:     General: Bowel sounds are increased. There is no distension.     Palpations: Abdomen is soft.     Tenderness: There is no abdominal tenderness.  Musculoskeletal:     Cervical back: Neck supple.     Right lower leg: No edema.     Left lower leg: No edema.  Skin:    General: Skin is warm and dry.  Neurological:     General: No focal deficit present.     Mental Status: She is alert and oriented to person, place, and time.  Psychiatric:        Mood and Affect: Mood normal.        Behavior: Behavior normal. Behavior is cooperative.     Data Reviewed:  Results are pending, will review when available.  Assessment and Plan: Principal Problem:   Urolithiasis Associated with:   Complicated UTI (urinary tract infection) Admit to MedSurg/inpatient. Analgesics as needed. Antiemetics as needed. Continue ceftriaxone  1 g IVPB daily. Follow-up urine culture and sensitivity. Follow-up CBC and chemistry in the morning. Urology consult and procedure appreciated.  Active Problems:   Polycystic kidney disease Renal function is normal.     Hyperglycemia Nonfasting level. Will recheck fasting glucose in the morning. Will check hemoglobin A1c if elevated.    Advance Care Planning:   Code Status: Full Code   Consults:   Family Communication:   Severity of Illness: The appropriate patient status for this patient is INPATIENT. Inpatient status is judged to be reasonable and necessary in order to provide the required intensity of service to ensure the patient's safety. The patient's presenting symptoms, physical exam findings, and initial radiographic and laboratory data in the context of their chronic comorbidities is felt to place them at high risk for further clinical deterioration. Furthermore, it is not anticipated that the patient will be medically stable for discharge from the hospital within 2 midnights of admission.   * I certify that at the point of admission it is my clinical judgment  that the patient will require inpatient hospital care spanning beyond 2 midnights from the point of admission due to high intensity of service, high risk for further deterioration and high frequency of surveillance required.*  Author: Alm Dorn Castor, MD 04/21/2024 7:55 AM  For on call review [email or web address].   This document was prepared using Dragon voice recognition software and may contain some unintended transcription errors.

## 2024-04-21 NOTE — Transfer of Care (Signed)
 Immediate Anesthesia Transfer of Care Note  Patient: Colleen Oneill  Procedure(s) Performed: Procedure(s): CYSTOSCOPY, WITH BLADDER CALCULUS LITHOLAPAXY (N/A)  Patient Location: PACU  Anesthesia Type:General  Level of Consciousness:  sedated, patient cooperative and responds to stimulation  Airway & Oxygen Therapy:Patient Spontanous Breathing and Patient connected to face mask oxgen  Post-op Assessment:  Report given to PACU RN and Post -op Vital signs reviewed and stable  Post vital signs:  Reviewed and stable  Last Vitals:  Vitals:   04/21/24 0919 04/21/24 1234  BP: 124/74 114/77  Pulse: 74 70  Resp: 16 14  Temp: 37 C 36.7 C  SpO2: 100% 99%    Complications: No apparent anesthesia complications

## 2024-04-21 NOTE — ED Provider Notes (Signed)
 Care transferred to me. Dr. Shane has seen and asks for medical admission and antibiotics. He will take to OR (likely today, keep NPO).  Dr Celinda will admit.   Freddi Hamilton, MD 04/21/24 [phone]

## 2024-04-21 NOTE — ED Notes (Signed)
 Urology at bedside.

## 2024-04-21 NOTE — Anesthesia Preprocedure Evaluation (Addendum)
 Anesthesia Evaluation  Patient identified by MRN, date of birth, ID band Patient awake    Reviewed: Allergy & Precautions, NPO status , Patient's Chart, lab work & pertinent test results  Airway Mallampati: II  TM Distance: >3 FB Neck ROM: Full    Dental  (+) Teeth Intact, Dental Advisory Given   Pulmonary neg pulmonary ROS   Pulmonary exam normal breath sounds clear to auscultation       Cardiovascular negative cardio ROS Normal cardiovascular exam Rhythm:Regular Rate:Normal     Neuro/Psych negative neurological ROS  negative psych ROS   GI/Hepatic negative GI ROS, Neg liver ROS,,,  Endo/Other  negative endocrine ROS    Renal/GU negative Renal ROS  negative genitourinary   Musculoskeletal negative musculoskeletal ROS (+)    Abdominal   Peds  Hematology negative hematology ROS (+) Hb 12.7   Anesthesia Other Findings   Reproductive/Obstetrics negative OB ROS                              Anesthesia Physical Anesthesia Plan  ASA: 2  Anesthesia Plan: General   Post-op Pain Management: Tylenol  PO (pre-op)*   Induction: Intravenous  PONV Risk Score and Plan: 3 and Ondansetron , Dexamethasone , Midazolam  and Treatment may vary due to age or medical condition  Airway Management Planned: LMA  Additional Equipment: None  Intra-op Plan:   Post-operative Plan: Extubation in OR  Informed Consent: I have reviewed the patients History and Physical, chart, labs and discussed the procedure including the risks, benefits and alternatives for the proposed anesthesia with the patient or authorized representative who has indicated his/her understanding and acceptance.     Dental advisory given  Plan Discussed with: CRNA  Anesthesia Plan Comments:          Anesthesia Quick Evaluation

## 2024-04-21 NOTE — Op Note (Signed)
 Dictation cystolitholapaxy  Preoperative diagnosis: Retained right ureteral stent with renal pelvis stone and 3 cm bladder stone  Postoperative diagnosis: Same  Surgeon: Dr. Steffan Pea  Procedures performed: Cystolitholapaxy for 3 cm stone  Operative findings: Large bladder stone with significant adherent to the stent the stent is calcified along the ureter.  All stone within the bladder removed stent.  Anesthesia: General  Antibiotics: Ceftriaxone   Fluids: Per anesthesia  Urine output: Unavailable  Drains: None  EBL: Minimal  Complications: None  Indication for procedure: Ms. Colleen Oneill is a 33 year old female who had stenting in 2022 by Dr. Rosalind for distal right ureteral stone she never returned to clinic.  The stone has been indwelling for the past 3 years she subsequently developed a calculus on the bladder component as well as a renal pelvis calculus due to social determinants she has been unable to get an outpatient appointment incisions made to admit her to treat the stone as an inpatient.  She was planned for cystolitholapaxy today.  Prior to procedure we discussed risk benefits alternatives including bleeding infection damage from structures possible injury to the bladder resulting perforation possible injury to the ureters we also explained the inability to remove the complete stent until PCI was completed.  Procedure detail: After informed consent was confirmed the preoperative patient and her procedure received by anesthesia team she was then prepped and draped in usual sterile fashion in dorsolithotomy position.  Next time was performed appropriate patient procedure then a 26 French resectoscope was advanced into the bladder the inner operator was exchanged for the laser bridge.  Laser bridge then broke up the stone using 1000 m Moses fiber laser on settings of 1 J and 20 Hz stone was broken up until all fragments could be removed with scope evacuation.  Scope to  remove all the remaining fragments inspection of the bladder revealed no injury evaluation the stent so that there was circumferential calcification of the stent extending into the ureteral orifice the curl within the bladder was completely cleaned of stone.  Patient tolerated procedure well she was in expert op suite and taken to PACU in stable condition.  Disposition: Will plan for PCNL in the near future will discuss with patient tomorrow.

## 2024-04-21 NOTE — Anesthesia Procedure Notes (Signed)
 Procedure Name: LMA Insertion Date/Time: 04/21/2024 2:44 PM  Performed by: Vincenzo Show, CRNAPre-anesthesia Checklist: Patient identified, Emergency Drugs available, Suction available, Patient being monitored and Timeout performed Patient Re-evaluated:Patient Re-evaluated prior to induction Oxygen Delivery Method: Circle system utilized Preoxygenation: Pre-oxygenation with 100% oxygen Induction Type: IV induction Ventilation: Mask ventilation without difficulty LMA: LMA inserted LMA Size: 4.0 Tube size: 4.0 mm Number of attempts: 1 Placement Confirmation: positive ETCO2 and breath sounds checked- equal and bilateral Tube secured with: Tape Dental Injury: Teeth and Oropharynx as per pre-operative assessment  Comments: AT LMA easy pass.

## 2024-04-21 NOTE — Consult Note (Signed)
 I have been asked to see the patient by Dr. Roxie Bough, for evaluation and management of retained stent bladder stone and kidney stone.  History of present illness: 33 year old female with history of polycystic kidney disease who had a right distal ureteral stone that was stented by Dr. Rosalind in 2022 for obstructive pyelonephritis patient never followed up as been read currently returning to the emergency room.  CT demonstrates a large renal pelvis stone as well as a large bladder stone she presented for pain in the bladder.  Due to her social determinants will have the patient admitted.  Plan to treat retained stent while patient is admitted.  Patient does have a history of polycystic kidney disease is not followed for this.   She denies any medical problems other than polycystic kidney disease   Past surgical history includes C-section x 4.  And right ureteral stent.   Patient's labs include white count is within normal limits as well as kidney function lites within normal limits.  He is going home to   Review of systems: A 12 point comprehensive review of systems was obtained and is negative unless otherwise stated in the history of present illness.  Patient Active Problem List   Diagnosis Date Noted   Odontogenic infection of jaw 01/16/2022   Nausea and vomiting 04/27/2021   Pyelonephritis 03/02/2021   Sepsis (HCC) 03/01/2021   Complicated UTI (urinary tract infection) 03/01/2021   Polycystic kidney disease 03/01/2021   Anemia, chronic disease 03/01/2021   Premature uterine contractions 08/23/2013   Fetal heart rate decelerations, delivered 08/23/2013   Nausea and vomiting in pregnancy prior to [redacted] weeks gestation 03/15/2013    No current facility-administered medications on file prior to encounter.   Current Outpatient Medications on File Prior to Encounter  Medication Sig Dispense Refill   acetaminophen  (TYLENOL ) 325 MG tablet Take 2 tablets (650 mg total) by mouth every  6 (six) hours as needed for mild pain or moderate pain.     polyethylene glycol powder (GLYCOLAX /MIRALAX ) 17 GM/SCOOP powder Take 1 capful (17 g) with water by mouth daily as needed for mild constipation. 238 g 0    Past Medical History:  Diagnosis Date   Medical history non-contributory    Polycystic kidney disease     Past Surgical History:  Procedure Laterality Date   CESAREAN SECTION     CESAREAN SECTION N/A 08/09/2012   Procedure: Repeat CESAREAN SECTION of baby boy  at 0431  APGAR 9/9;  Surgeon: Aida DELENA Na, MD;  Location: [facility];  Service: Obstetrics;  Laterality: N/A;   CESAREAN SECTION N/A 08/23/2013   Procedure: CESAREAN SECTION;  Surgeon: Carlin DELENA Centers, MD;  Location: [facility];  Service: Obstetrics;  Laterality: N/A;   CYSTOSCOPY/URETEROSCOPY/HOLMIUM LASER/STENT PLACEMENT Right 03/02/2021   Procedure: CYSTOSCOPY; RIGHT RETROGRADE PYELOGRAM; RIGTH URETERAL STENT PLACEMENT;  Surgeon: Rosalind Zachary NOVAK, MD;  Location: [facility];  Service: Urology;  Laterality: Right;    Social History   Tobacco Use   Smoking status: Never   Smokeless tobacco: Never  Vaping Use   Vaping status: Never Used  Substance Use Topics   Alcohol use: No   Drug use: No    Family History  Problem Relation Age of Onset   Kidney disease Father    Diabetes Maternal Grandmother    Other Neg Hx     PE: Vitals:   04/21/24 0500 04/21/24 0600 04/21/24 0730 04/21/24 0732  BP: 101/64 102/69 (!) 130/104   Pulse: (!) 59 (!)  53 75   Resp: 12 14 17    Temp:    97.8 F (36.6 C)  TempSrc:    Oral  SpO2: 98% 99% 99%   Weight:      Height:       Patient appears to be in no acute distress  patient is alert and oriented x3 Respiratory: Normal work of breathing room air Cardiovascular: Rate rate and rhythm per monitor  Recent Labs    04/20/24 1915  WBC 7.3  HGB 12.7  HCT 39.6   Recent Labs    04/20/24 1915  NA 142  K 3.9  CL 104  CO2 27  GLUCOSE 130*  BUN 22*  CREATININE 0.75   CALCIUM  10.0   No results for input(s): LABPT, INR in the last 72 hours. No results for input(s): LABURIN in the last 72 hours. Results for orders placed or performed during the hospital encounter of 12/29/23  Urine Culture     Status: Abnormal   Collection Time: 12/29/23  8:20 PM   Specimen: Urine, Clean Catch  Result Value Ref Range Status   Specimen Description   Final    URINE, CLEAN CATCH Performed at [facility], [address] [place], [place] [address]    Special Requests   Final    NONE Performed at [facility], [address] [place], [place] [address]    Culture MULTIPLE SPECIES PRESENT, SUGGEST RECOLLECTION (A)  Final   Report Status 12/31/2023 FINAL  Final    Imaging: CT impression 04/21/2024 IMPRESSION: 1. Large right upper pole staghorn calculus and scattered small left renal stones without hydronephrosis bilaterally. 2. Right ureteral stent in place with large bladder stone. 3. 3.9 cm left ovarian simple-appearing ovarian cyst; in premenopausal patients, no follow-up is recommended. In postmenopausal patients, recommend pelvic ultrasound follow-up in 612 months.    Imp: 33 year old female with a routine right-sided stent from 2022 stent is now significantly calcified with a large bladder calculus and large renal pelvis calculus due to the patient social determinants she has not been able to be seen as outpatient we have attempted to schedule her as an outpatient to treat the stone she has been unable to attend.  Because of this we will have the patient admitted to medicine and proceed with treatment of stone while inpatient.  For bladder stone we will plan for cystolitholapaxy today.  We discussed risk benefits alternatives to the procedure including bleeding infection demonstrating structures possible injury to the bladder possible injury to the urethra possible infection patient voiced understanding consent was  obtained.  Patient will likely plan for PCNL next week sometime will need IR nephrostomy tube placed prior to that time.  Recommend admit to medicine   Thank you for involving me in this patient's care, I will continue to follow along.Please page with any further questions or concerns. Steffan JAYSON Pea

## 2024-04-22 ENCOUNTER — Encounter (HOSPITAL_COMMUNITY): Payer: Self-pay | Admitting: Urology

## 2024-04-22 ENCOUNTER — Other Ambulatory Visit (HOSPITAL_COMMUNITY): Payer: Self-pay

## 2024-04-22 DIAGNOSIS — N390 Urinary tract infection, site not specified: Secondary | ICD-10-CM

## 2024-04-22 DIAGNOSIS — Q613 Polycystic kidney, unspecified: Secondary | ICD-10-CM | POA: Diagnosis not present

## 2024-04-22 DIAGNOSIS — K5900 Constipation, unspecified: Secondary | ICD-10-CM

## 2024-04-22 DIAGNOSIS — N210 Calculus in bladder: Secondary | ICD-10-CM | POA: Diagnosis not present

## 2024-04-22 DIAGNOSIS — N200 Calculus of kidney: Secondary | ICD-10-CM

## 2024-04-22 LAB — URINE CULTURE

## 2024-04-22 LAB — CBC
HCT: 32.4 % — ABNORMAL LOW (ref 36.0–46.0)
Hemoglobin: 10.6 g/dL — ABNORMAL LOW (ref 12.0–15.0)
MCH: 32 pg (ref 26.0–34.0)
MCHC: 32.7 g/dL (ref 30.0–36.0)
MCV: 97.9 fL (ref 80.0–100.0)
Platelets: 242 K/uL (ref 150–400)
RBC: 3.31 MIL/uL — ABNORMAL LOW (ref 3.87–5.11)
RDW: 11.8 % (ref 11.5–15.5)
WBC: 5.4 K/uL (ref 4.0–10.5)
nRBC: 0 % (ref 0.0–0.2)

## 2024-04-22 LAB — COMPREHENSIVE METABOLIC PANEL WITH GFR
ALT: 6 U/L (ref 0–44)
AST: 15 U/L (ref 15–41)
Albumin: 4.3 g/dL (ref 3.5–5.0)
Alkaline Phosphatase: 31 U/L — ABNORMAL LOW (ref 38–126)
Anion gap: 11 (ref 5–15)
BUN: 20 mg/dL (ref 6–20)
CO2: 24 mmol/L (ref 22–32)
Calcium: 9.5 mg/dL (ref 8.9–10.3)
Chloride: 104 mmol/L (ref 98–111)
Creatinine, Ser: 0.63 mg/dL (ref 0.44–1.00)
GFR, Estimated: >60 mL/min (ref >60)
Glucose, Bld: 112 mg/dL — ABNORMAL HIGH (ref 70–99)
Potassium: 4 mmol/L (ref 3.5–5.1)
Sodium: 138 mmol/L (ref 135–145)
Total Bilirubin: 0.5 mg/dL (ref 0.0–1.2)
Total Protein: 6.8 g/dL (ref 6.5–8.1)

## 2024-04-22 LAB — HIV ANTIBODY (ROUTINE TESTING W REFLEX): HIV Screen 4th Generation wRfx: NONREACTIVE

## 2024-04-22 MED ORDER — OXYCODONE-ACETAMINOPHEN 5-325 MG PO TABS
1.0000 | ORAL_TABLET | ORAL | 0 refills | Status: AC | PRN
  Filled 2024-04-22: qty 14, 3d supply, fill #0

## 2024-04-22 MED ORDER — SULFAMETHOXAZOLE-TRIMETHOPRIM 800-160 MG PO TABS
1.0000 | ORAL_TABLET | Freq: Two times a day (BID) | ORAL | 0 refills | Status: AC
  Filled 2024-04-22: qty 10, 5d supply, fill #0

## 2024-04-22 MED ORDER — DULCOLAX 5 MG PO TBEC
5.0000 mg | DELAYED_RELEASE_TABLET | Freq: Every day | ORAL | 0 refills | Status: AC | PRN
  Filled 2024-04-22: qty 30, 30d supply, fill #0

## 2024-04-22 MED ORDER — POLYETHYLENE GLYCOL 3350 17 GM/SCOOP PO POWD
17.0000 g | Freq: Every day | ORAL | 0 refills | Status: AC
  Filled 2024-04-22: qty 238, 14d supply, fill #0

## 2024-04-22 NOTE — Plan of Care (Signed)
  Problem: Clinical Measurements: Goal: Respiratory complications will improve Outcome: Progressing Goal: Cardiovascular complication will be avoided Outcome: Progressing   Problem: Activity: Goal: Risk for activity intolerance will decrease Outcome: Progressing   Problem: Nutrition: Goal: Adequate nutrition will be maintained Outcome: Progressing   Problem: Coping: Goal: Level of anxiety will decrease Outcome: Progressing   Problem: Elimination: Goal: Will not experience complications related to bowel motility Outcome: Progressing Goal: Will not experience complications related to urinary retention Outcome: Progressing   Problem: Pain Managment: Goal: General experience of comfort will improve and/or be controlled Outcome: Progressing   Problem: Safety: Goal: Ability to remain free from injury will improve Outcome: Progressing

## 2024-04-22 NOTE — Discharge Summary (Signed)
 Physician Discharge Summary   Patient: Colleen Oneill MRN: [id number] DOB: [birth year]  Admit date:     04/20/2024  Discharge date: 04/22/24  Discharge Physician: Carliss LELON Canales   PCP: Pcp, No   Recommendations at discharge:    Pt to be discharged home.   If you experience worsening fever, chills, chest pain, shortness of breath, or other concerning symptoms, please call your PCP or go to the emergency department immediately.  Discharge Diagnoses: Principal Problem:   Urolithiasis Active Problems:   Complicated UTI (urinary tract infection)   Polycystic kidney disease   Hyperglycemia  Resolved Problems:   * No resolved hospital problems. 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    Assessment and Plan:   Retained right ureteral stent with nephrolithiasis/urolithiasis - Large bladder stone and renal pelvis stone noted on imaging.  Evaluated by urology.  S/p cystolitholapaxy with bladder stone and stent removal 10/30.  Initial plan to stay inpatient to evaluate for percutaneous nephrolithotomy (PCNL) however unavailable procedure for greater than 1 week.  Per urology recommendations, patient to be discharged home with follow-up with urology in the outpatient setting to pursue PCNL.  Will give short course prescription for Percocet 5/325 to take as directed.     Concern for complicated urinary tract infection - Secondary to above.  Empiric ceftriaxone  on board while inpatient.  UA noting LE, WBC, bacteria.  Urine culture pending.  Transition to empiric p.o. Bactrim .  Constipation - Appears to be somewhat chronic, complaining of constipation over the last 2 months.  Will give prescriptions for MiraLAX  and  Dulcolax to use as directed.  Encourage adequate hydration and fiber supplementation as well.   Polycystic kidney disease - Renal function normal.   Consultants: Urology Procedures performed: Cystolitholopaxy bladder stone and stent removal Disposition: Home Diet recommendation:  Regular diet  DISCHARGE MEDICATION: Allergies as of 04/22/2024   No Known Allergies      Medication List     TAKE these medications    Dulcolax 5 MG EC tablet Generic drug: bisacodyl  Take 1 tablet (5 mg total) by mouth daily as needed for moderate constipation.   oxyCODONE -acetaminophen  5-325 MG tablet Commonly known as: Percocet Take 1 tablet by mouth every 4 (four) hours as needed for severe pain (pain score 7-10).   polyethylene glycol 17 g packet Commonly known as: MiraLax  Take 17 g by mouth daily.   sulfamethoxazole -trimethoprim  800-160 MG tablet Commonly known as: Bactrim  DS Take 1 tablet by mouth 2 (two) times daily.         Discharge Exam: [name]   04/20/24 1908 04/21/24 1239  Weight: 57.2 kg 57.2 kg    GENERAL:  Alert, pleasant, no acute distress  HEENT:  EOMI CARDIOVASCULAR:  RRR, no murmurs appreciated RESPIRATORY:  Clear to auscultation, no wheezing, rales, or rhonchi GASTROINTESTINAL:  Soft, nontender, nondistended EXTREMITIES:  No LE edema bilaterally NEURO:  No new focal deficits appreciated SKIN:  No rashes noted PSYCH:  Appropriate mood and affect     Condition at discharge: improving  The results of significant diagnostics from this hospitalization (including imaging, microbiology, ancillary and laboratory) are listed below for reference.   Imaging Studies: CT Renal Stone Study Result Date: 04/20/2024 EXAM: CT ABDOMEN AND PELVIS WITHOUT CONTRAST 04/20/2024  10:22:29 PM TECHNIQUE: CT of the abdomen and pelvis was performed without the administration of intravenous contrast. Multiplanar reformatted images are provided for review. Automated exposure  control, iterative reconstruction, and/or weight-based adjustment of the mA/kV was utilized to reduce the radiation dose to as low as reasonably achievable. COMPARISON: 02/07/2024 CLINICAL HISTORY: Abdominal/flank pain, stone suspected. Per triage note: Pt reports lower abdominal pain for the last month. Pt reports hx of kidney stones and being treated for UTI, pt reports dysuria that has come back in the last week. Pt reports she has not been able to afford the visit to the urologist. FINDINGS: LOWER CHEST: No acute abnormality. LIVER: Innumerable hepatic cysts are stable and appear benign. GALLBLADDER AND BILE DUCTS: Gallbladder is unremarkable. No biliary ductal dilatation. SPLEEN: No acute abnormality. PANCREAS: No acute abnormality. ADRENAL GLANDS: No acute abnormality. KIDNEYS, URETERS AND BLADDER: Numerous bilateral renal cysts are stable. Large right upper pole staghorn calculus, stable . Scattered small left renal stones, stable . Right ureteral stent is in place with large bladder stone. No hydronephrosis bilaterally. No perinephric or periureteral stranding. GI AND BOWEL: Stomach demonstrates no acute abnormality. There is no bowel obstruction. PERITONEUM AND RETROPERITONEUM: No ascites. No free air. VASCULATURE: Aorta is normal in caliber. LYMPH NODES: No lymphadenopathy. REPRODUCTIVE ORGANS: 3.9 cm left ovarian cyst. BONES AND SOFT TISSUES: No acute osseous abnormality. No focal soft tissue abnormality. IMPRESSION: 1. Large right upper pole staghorn calculus and scattered small left renal stones without hydronephrosis bilaterally. 2. Right ureteral stent in place with large bladder stone. 3. 3.9 cm left ovarian simple-appearing ovarian cyst; in premenopausal patients, no follow-up is recommended. In postmenopausal patients, recommend pelvic ultrasound follow-up in 612 months. Electronically signed by: Franky Crease MD 04/20/2024 10:28 PM EDT RP Workstation: [id number]    Microbiology: Results for  orders placed or performed during the hospital encounter of 12/29/23  Urine Culture     Status: Abnormal   Collection Time: 12/29/23  8:20 PM   Specimen: Urine, Clean Catch  Result Value Ref Range Status   Specimen Description   Final    URINE, CLEAN CATCH Performed at [facility], [address], [place], [place] [address]    Special Requests   Final    NONE Performed at [facility], [address] [place], [place] [address]    Culture MULTIPLE SPECIES PRESENT, SUGGEST RECOLLECTION (A)  Final   Report Status 12/31/2023 FINAL  Final    Labs: CBC: Recent Labs  Lab 04/20/24 1915 04/22/24 0449  WBC 7.3 5.4  HGB 12.7 10.6*  HCT 39.6 32.4*  MCV 97.5 97.9  PLT 302 242   Basic Metabolic Panel: Recent Labs  Lab 04/20/24 1915 04/22/24 0449  NA 142 138  K 3.9 4.0  CL 104 104  CO2 27 24  GLUCOSE 130* 112*  BUN 22* 20  CREATININE 0.75 0.63  CALCIUM  10.0 9.5   Liver Function Tests: Recent Labs  Lab 04/20/24 1915 04/22/24 0449  AST 20 15  ALT 7 6  ALKPHOS 38 31*  BILITOT 0.5 0.5  PROT 7.9 6.8  ALBUMIN 4.9 4.3   CBG: No results for input(s): GLUCAP in the last 168 hours.  Discharge time spent: 39 minutes.  Length of inpatient stay: 1 days  Signed: Carliss LELON Canales, DO [facility] 04/22/2024

## 2024-04-22 NOTE — Progress Notes (Signed)
 Had discussion with patient. There is no time for a PCNL in the near future. Our office now takes her insurance will have her come back to the office to discuss PCNL. PT was agreeable.

## 2024-04-22 NOTE — Progress Notes (Signed)
 1 Day Post-Op Subjective: Doing well, having some GH, some nausea. Still having pain with movement this is expected since stent is still in place.   Objective: Vital signs in last 24 hours: Temp:  [97.7 F (36.5 C)-98.6 F (37 C)] 98.4 F (36.9 C) (10/31 0429) Pulse Rate:  [54-89] 64 (10/31 0429) Resp:  [13-19] 15 (10/31 0429) BP: (95-130)/(64-104) 111/71 (10/31 0429) SpO2:  [98 %-100 %] 100 % (10/31 0429) Weight:  [57.2 kg] 57.2 kg (10/30 1239)  Intake/Output from previous day: 10/30 0701 - 10/31 0700 In: 1300 [P.O.:200; I.V.:1000; IV Piggyback:100] Out: -  Intake/Output this shift: No intake/output data recorded.  Physical Exam:  General: Alert and oriented CV: RRR Lungs: Clear Abdomen: Soft, ND, ATTP; Ext: NT, No erythema GU no catheter   Lab Results: Recent Labs    04/20/24 1915 04/22/24 0449  HGB 12.7 10.6*  HCT 39.6 32.4*   BMET Recent Labs    04/20/24 1915 04/22/24 0449  NA 142 138  K 3.9 4.0  CL 104 104  CO2 27 24  GLUCOSE 130* 112*  BUN 22* 20  CREATININE 0.75 0.63  CALCIUM  10.0 9.5     Studies/Results: CT Renal Stone Study Result Date: 04/20/2024 EXAM: CT ABDOMEN AND PELVIS WITHOUT CONTRAST 04/20/2024 10:22:29 PM TECHNIQUE: CT of the abdomen and pelvis was performed without the administration of intravenous contrast. Multiplanar reformatted images are provided for review. Automated exposure control, iterative reconstruction, and/or weight-based adjustment of the mA/kV was utilized to reduce the radiation dose to as low as reasonably achievable. COMPARISON: 02/07/2024 CLINICAL HISTORY: Abdominal/flank pain, stone suspected. Per triage note: Pt reports lower abdominal pain for the last month. Pt reports hx of kidney stones and being treated for UTI, pt reports dysuria that has come back in the last week. Pt reports she has not been able to afford the visit to the urologist. FINDINGS: LOWER CHEST: No acute abnormality. LIVER: Innumerable hepatic cysts  are stable and appear benign. GALLBLADDER AND BILE DUCTS: Gallbladder is unremarkable. No biliary ductal dilatation. SPLEEN: No acute abnormality. PANCREAS: No acute abnormality. ADRENAL GLANDS: No acute abnormality. KIDNEYS, URETERS AND BLADDER: Numerous bilateral renal cysts are stable. Large right upper pole staghorn calculus, stable . Scattered small left renal stones, stable . Right ureteral stent is in place with large bladder stone. No hydronephrosis bilaterally. No perinephric or periureteral stranding. GI AND BOWEL: Stomach demonstrates no acute abnormality. There is no bowel obstruction. PERITONEUM AND RETROPERITONEUM: No ascites. No free air. VASCULATURE: Aorta is normal in caliber. LYMPH NODES: No lymphadenopathy. REPRODUCTIVE ORGANS: 3.9 cm left ovarian cyst. BONES AND SOFT TISSUES: No acute osseous abnormality. No focal soft tissue abnormality. IMPRESSION: 1. Large right upper pole staghorn calculus and scattered small left renal stones without hydronephrosis bilaterally. 2. Right ureteral stent in place with large bladder stone. 3. 3.9 cm left ovarian simple-appearing ovarian cyst; in premenopausal patients, no follow-up is recommended. In postmenopausal patients, recommend pelvic ultrasound follow-up in 612 months. Electronically signed by: Franky Crease MD 04/20/2024 10:28 PM EDT RP Workstation: [id number]    Assessment/Plan: 74 F w/ retained stent s/p cystolithopaxy on 10/30.  #Retained R ureteral sent  - has large renal pelvis stone and bladder stone - bladder stone removed on 0/30 - explained to patient that addding on a PCNL will be challenging short notice but due to concerns about follow up will keep as inpatient until plan is made for renal stone removal. - will finalize plan tomorrow  - continue abx  LOS: 1 day   Jackey Pea MD 04/22/2024, 7:15 AM [facility]

## 2024-04-22 NOTE — Hospital Course (Signed)
 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    Assessment and Plan:   Retained right ureteral stent with nephrolithiasis/urolithiasis - Large bladder stone and renal pelvis stone noted on imaging.  Evaluated by urology.  S/p cystolitholapaxy with stone and stent removal 10/30.  Staying inpatient to evaluate for percutaneous nephrolithotomy (PCNL).   Concern for complicated urinary tract infection - Secondary to above.  Ceftriaxone  on board.  UA noting LE, WBC, bacteria.  Urine culture pending.   Polycystic kidney disease - Renal function normal.

## 2024-04-22 NOTE — Progress Notes (Signed)
 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   Assessment and Plan:  Retained right ureteral stent with nephrolithiasis/urolithiasis - Large bladder stone and renal pelvis stone noted on imaging.  Evaluated by urology.  S/p cystolitholapaxy with stone and stent removal 10/30.  Staying inpatient to evaluate for percutaneous nephrolithotomy (PCNL).  Concern for complicated urinary tract infection - Secondary to above.  Ceftriaxone  on board.  UA noting LE, WBC, bacteria.  Urine culture pending.  Polycystic kidney disease - Renal function normal.   Subjective: Patient resting comfortably this morning.  Still having some abdominal pain but otherwise feels improved.  Denies any fever, chills, chest pain, nausea, vomiting.  Physical Exam:  Vitals:   04/21/24 2044 04/22/24 0008 04/22/24 0300 04/22/24 0429  BP: 108/65 108/64 110/68 111/71  Pulse: 81 72 66 64  Resp: 16 16  15   Temp: 98.3 F (36.8 C) 98.4 F (36.9 C)  98.4 F (36.9 C)  TempSrc:      SpO2: 98% 99% 100% 100%  Weight:      Height:        GENERAL:  Alert, pleasant, no acute distress  HEENT:  EOMI CARDIOVASCULAR:  RRR, no murmurs appreciated RESPIRATORY:  Clear to auscultation, no wheezing, rales, or rhonchi GASTROINTESTINAL:  Soft, nontender, nondistended EXTREMITIES:  No LE edema bilaterally NEURO:  No new focal deficits appreciated SKIN:  No rashes noted PSYCH:  Appropriate mood and affect     Data Reviewed:  Imaging Studies: CT Renal Stone Study Result Date: 04/20/2024 EXAM: CT ABDOMEN AND  PELVIS WITHOUT CONTRAST 04/20/2024 10:22:29 PM TECHNIQUE: CT of the abdomen and pelvis was performed without the administration of intravenous contrast. Multiplanar reformatted images are provided for review. Automated exposure control, iterative reconstruction, and/or weight-based adjustment of the mA/kV was utilized to reduce the radiation dose to as low as reasonably achievable. COMPARISON: 02/07/2024 CLINICAL HISTORY: Abdominal/flank pain, stone suspected. Per triage note: Pt reports lower abdominal pain for the last month. Pt reports hx of kidney stones and being treated for UTI, pt reports dysuria that has come back in the last week. Pt reports she has not been able to afford the visit to the urologist. FINDINGS: LOWER CHEST: No acute abnormality. LIVER: Innumerable hepatic cysts are stable and appear benign. GALLBLADDER AND BILE DUCTS: Gallbladder is unremarkable. No biliary ductal dilatation. SPLEEN: No acute abnormality. PANCREAS: No acute abnormality. ADRENAL GLANDS: No acute abnormality. KIDNEYS, URETERS AND BLADDER: Numerous bilateral renal cysts are stable. Large right upper pole staghorn calculus, stable . Scattered small left renal stones, stable . Right ureteral stent is in place with large bladder stone. No hydronephrosis bilaterally. No perinephric or periureteral stranding. GI AND BOWEL: Stomach demonstrates no acute abnormality. There is no bowel obstruction. PERITONEUM AND RETROPERITONEUM: No ascites. No free air. VASCULATURE: Aorta is normal in caliber. LYMPH NODES: No lymphadenopathy. REPRODUCTIVE ORGANS: 3.9 cm left ovarian cyst. BONES AND SOFT TISSUES: No acute osseous abnormality. No focal soft tissue abnormality. IMPRESSION: 1. Large right upper pole staghorn calculus and scattered small left renal stones without hydronephrosis bilaterally. 2. Right ureteral stent in place with large bladder stone. 3. 3.9 cm left  ovarian simple-appearing ovarian cyst; in premenopausal patients, no  follow-up is recommended. In postmenopausal patients, recommend pelvic ultrasound follow-up in 612 months. Electronically signed by: Franky Crease MD 04/20/2024 10:28 PM EDT RP Workstation: [id number]    There are no new results to review at this time.  Previous records (including but not limited to H&P, progress notes, nursing notes, TOC management) were reviewed in assessment of this patient.  Labs: CBC: Recent Labs  Lab 04/20/24 1915 04/22/24 0449  WBC 7.3 5.4  HGB 12.7 10.6*  HCT 39.6 32.4*  MCV 97.5 97.9  PLT 302 242   Basic Metabolic Panel: Recent Labs  Lab 04/20/24 1915 04/22/24 0449  NA 142 138  K 3.9 4.0  CL 104 104  CO2 27 24  GLUCOSE 130* 112*  BUN 22* 20  CREATININE 0.75 0.63  CALCIUM  10.0 9.5   Liver Function Tests: Recent Labs  Lab 04/20/24 1915 04/22/24 0449  AST 20 15  ALT 7 6  ALKPHOS 38 31*  BILITOT 0.5 0.5  PROT 7.9 6.8  ALBUMIN 4.9 4.3   CBG: No results for input(s): GLUCAP in the last 168 hours.  Scheduled Meds:  feeding supplement  237 mL Oral BID BM   influenza vac split trivalent PF  0.5 mL Intramuscular Tomorrow-1000   Continuous Infusions:  cefTRIAXone  (ROCEPHIN )  IV     PRN Meds:.acetaminophen  **OR** acetaminophen , morphine  injection, ondansetron  **OR** ondansetron  (ZOFRAN ) IV, polyethylene glycol, prochlorperazine  Family Communication: None at bedside  Disposition: Status is: Inpatient Remains inpatient appropriate because: Nephrolithiasis, UTI     Time spent: 34 minutes  Length of inpatient stay: 1 days  Author: Carliss LELON Canales, DO 04/22/2024 11:40 AM  For on call review [email or web address].

## 2024-04-22 NOTE — Progress Notes (Signed)
 Discharge meds in a secure bag delivered to patient in room by this RN

## 2024-04-22 NOTE — Anesthesia Postprocedure Evaluation (Signed)
 Anesthesia Post Note  Patient: Colleen Oneill  Procedure(s) Performed: CYSTOSCOPY, WITH BLADDER CALCULUS LITHOLAPAXY (Bladder)     Patient location during evaluation: PACU Anesthesia Type: General Level of consciousness: awake and alert Pain management: pain level controlled Vital Signs Assessment: post-procedure vital signs reviewed and stable Respiratory status: spontaneous breathing, nonlabored ventilation and respiratory function stable Cardiovascular status: blood pressure returned to baseline Postop Assessment: no apparent nausea or vomiting Anesthetic complications: no   No notable events documented.           Vertell Row

## 2024-05-02 ENCOUNTER — Other Ambulatory Visit: Payer: Self-pay | Admitting: Urology

## 2024-05-02 ENCOUNTER — Other Ambulatory Visit (HOSPITAL_COMMUNITY): Payer: Self-pay | Admitting: Urology

## 2024-05-02 DIAGNOSIS — N200 Calculus of kidney: Secondary | ICD-10-CM

## 2024-06-09 NOTE — Patient Instructions (Signed)
 SURGICAL WAITING ROOM VISITATION Patients having surgery or a procedure may have no more than 2 support people in the waiting area - these visitors may rotate in the visitor waiting room.   Due to an increase in RSV and influenza rates and associated hospitalizations, children ages 61 and under may not visit patients in [facility]. If the patient needs to stay at the hospital during part of their recovery, the visitor guidelines for inpatient rooms apply.  PRE-OP VISITATION  Pre-op nurse will coordinate an appropriate time for 1 support person to accompany the patient in pre-op.  This support person may not rotate.  This visitor will be contacted when the time is appropriate for the visitor to come back in the pre-op area.  Please refer to the [facility] website for the visitor guidelines for Inpatients (after your surgery is over and you are in a regular room).  You are not required to quarantine at this time prior to your surgery. However, you must do this: Hand Hygiene often Do NOT share personal items Notify your provider if you are in close contact with someone who has COVID or you develop fever 100.4 or greater, new onset of sneezing, cough, sore throat, shortness of breath or body aches.  If you test positive for Covid or have been in contact with anyone that has tested positive in the last 10 days please notify you surgeon.    Your procedure is scheduled on:  06/21/24  Report to [facility] Main Entrance: [place] entrance where the [employer] is available.   Report to admitting at: 10:15 AM  Call this number if you have any questions or problems the morning of surgery [phone]  FOLLOW ANY ADDITIONAL PRE OP INSTRUCTIONS YOU RECEIVED FROM YOUR SURGEON'S OFFICE!!!  Do not eat food or drink after Midnight the night prior to your surgery/procedure.   Oral Hygiene is also important to reduce your risk of infection.        Remember - BRUSH YOUR TEETH THE  MORNING OF SURGERY WITH YOUR REGULAR TOOTHPASTE  Do NOT smoke after Midnight the night before surgery.  STOP TAKING all Vitamins, Herbs and supplements 1 week before your surgery.   Take ONLY these medicines the morning of surgery with A SIP OF WATER : NONE.Tylenol  as needed.                   You may not have any metal on your body including hair pins, jewelry, and body piercing  Do not wear make-up, lotions, powders, perfumes / cologne, or deodorant  Do not wear nail polish including gel and S&S, artificial / acrylic nails, or any other type of covering on natural nails including finger and toenails. If you have artificial nails, gel coating, etc., that needs to be removed by a nail salon, Please have this removed prior to surgery. Not doing so may mean that your surgery could be cancelled or delayed if the Surgeon or anesthesia staff feels like they are unable to monitor you safely.   Do not shave 48 hours prior to surgery to avoid nicks in your skin which may contribute to postoperative infections.   Contacts, Hearing Aids, dentures or bridgework may not be worn into surgery. DENTURES WILL BE REMOVED PRIOR TO SURGERY PLEASE DO NOT APPLY Poly grip OR ADHESIVES!!!  You may bring a small overnight bag with you on the day of surgery, only pack items that are not valuable. [facility] IS NOT RESPONSIBLE  FOR VALUABLES THAT ARE LOST OR STOLEN.   Patients discharged on the day of surgery will not be allowed to drive home.  Someone NEEDS to stay with you for the first 24 hours after anesthesia.  Do not bring your home medications to the hospital. The Pharmacy will dispense medications listed on your medication list to you during your admission in the Hospital.  Special Instructions: Bring a copy of your healthcare power of attorney and living will documents the day of surgery, if you wish to have them scanned into your [facility] [facility] Records- EPIC  Please read over the following fact  sheets you were given: IF YOU HAVE QUESTIONS ABOUT YOUR PRE-OP INSTRUCTIONS, PLEASE CALL [phone]   [facility] - Preparing for Surgery      Before surgery, you can play an important role.  Because skin is not sterile, your skin needs to be as free of germs as possible.  You can reduce the number of germs on your skin by washing with CHG (chlorahexidine gluconate) soap before surgery.  CHG is an antiseptic cleaner which kills germs and bonds with the skin to continue killing germs even after washing. Please DO NOT use if you have an allergy to CHG or antibacterial soaps.  If your skin becomes reddened/irritated stop using the CHG and inform your nurse when you arrive at Short Stay. Do not shave (including legs and underarms) for at least 48 hours prior to the first CHG shower.  You may shave your face/neck.  Please follow these instructions carefully:  1.  Shower with CHG Soap the night before surgery ONLY (DO NOT USE THE SOAP THE MORNING OF SURGERY).  2.  If you choose to wash your hair, wash your hair first as usual with your normal  shampoo.  3.  After you shampoo, rinse your hair and body thoroughly to remove the shampoo.                             4.  Use CHG as you would any other liquid soap.  You can apply chg directly to the skin and wash.  Gently with a scrungie or clean washcloth.  5.  Apply the CHG Soap to your body ONLY FROM THE NECK DOWN.   Do not use on face/ open                           Wound or open sores. Avoid contact with eyes, ears mouth and genitals (private parts).                       Wash face,  Genitals (private parts) with your normal soap.             6.  Wash thoroughly, paying special attention to the area where your  surgery  will be performed.  7.  Thoroughly rinse your body with warm water  from the neck down.  8.  DO NOT shower/wash with your normal soap after using and rinsing off the CHG Soap.                9.  Pat yourself dry with a clean towel.             10.  Wear clean pajamas.            11.  Place clean sheets on your bed the night of  your first shower and do not  sleep with pets.  Day of Surgery : Do not apply any CHG, lotions/deodorants the morning of surgery.  Please wear clean clothes to the hospital/surgery center.   FAILURE TO FOLLOW THESE INSTRUCTIONS MAY RESULT IN THE CANCELLATION OF YOUR SURGERY  PATIENT SIGNATURE_________________________________  NURSE SIGNATURE__________________________________  ________________________________________________________________________

## 2024-06-13 ENCOUNTER — Encounter (HOSPITAL_COMMUNITY)
Admission: RE | Admit: 2024-06-13 | Discharge: 2024-06-13 | Disposition: A | Discharge status: Home / Self Care | Source: Ambulatory Visit | Attending: Urology | Admitting: Urology

## 2024-06-13 ENCOUNTER — Encounter (HOSPITAL_COMMUNITY): Payer: Self-pay

## 2024-06-13 ENCOUNTER — Other Ambulatory Visit: Payer: Self-pay

## 2024-06-13 VITALS — BP 130/83 | HR 73 | Temp 98.7°F | Ht 68.0 in | Wt 115.0 lb

## 2024-06-13 DIAGNOSIS — Z01812 Encounter for preprocedural laboratory examination: Secondary | ICD-10-CM | POA: Insufficient documentation

## 2024-06-13 DIAGNOSIS — Z01818 Encounter for other preprocedural examination: Secondary | ICD-10-CM | POA: Diagnosis present

## 2024-06-13 LAB — CBC
HCT: 35.3 % — ABNORMAL LOW (ref 36.0–46.0)
Hemoglobin: 11.7 g/dL — ABNORMAL LOW (ref 12.0–15.0)
MCH: 32.3 pg (ref 26.0–34.0)
MCHC: 33.1 g/dL (ref 30.0–36.0)
MCV: 97.5 fL (ref 80.0–100.0)
Platelets: 310 K/uL (ref 150–400)
RBC: 3.62 MIL/uL — ABNORMAL LOW (ref 3.87–5.11)
RDW: 12.2 % (ref 11.5–15.5)
WBC: 6.3 K/uL (ref 4.0–10.5)
nRBC: 0 % (ref 0.0–0.2)

## 2024-06-13 LAB — BASIC METABOLIC PANEL WITH GFR
Anion gap: 10 (ref 5–15)
BUN: 23 mg/dL — ABNORMAL HIGH (ref 6–20)
CO2: 25 mmol/L (ref 22–32)
Calcium: 9.9 mg/dL (ref 8.9–10.3)
Chloride: 105 mmol/L (ref 98–111)
Creatinine, Ser: 0.69 mg/dL (ref 0.44–1.00)
GFR, Estimated: >60 mL/min
Glucose, Bld: 131 mg/dL — ABNORMAL HIGH (ref 70–99)
Potassium: 3.8 mmol/L (ref 3.5–5.1)
Sodium: 140 mmol/L (ref 135–145)

## 2024-06-13 LAB — PROTIME-INR
INR: 1.1 (ref 0.8–1.2)
Prothrombin Time: 14.5 s (ref 11.4–15.2)

## 2024-06-13 NOTE — Progress Notes (Signed)
 For Anesthesia: PCP - [facility] Cardiologist - N/A  Bowel Prep reminder:  Chest x-ray -  EKG -  Stress Test -  ECHO -  Cardiac Cath -  Pacemaker/ICD device last checked: Pacemaker orders received: Device Rep notified:  Spinal Cord Stimulator:N/A  Sleep Study - N/A CPAP -   Fasting Blood Sugar - N/A Checks Blood Sugar _____ times a day Date and result of last Hgb A1c-  Last dose of GLP1 agonist- N/A GLP1 instructions: Hold 7 days prior to schedule (Hold 24 hours-daily)   Last dose of SGLT-2 inhibitors- N/A SGLT-2 instructions: Hold 72 hours prior to surgery  Blood Thinner Instructions:N/A Last Dose: Time last taken:  Aspirin Instructions:N/A Last Dose: Time last taken:  Activity level: Can go up a flight of stairs and activities of daily living without stopping and without chest pain and/or shortness of breath   Able to exercise without chest pain and/or shortness of breath  Anesthesia review:   Patient denies shortness of breath, fever, cough and chest pain at PAT appointment   Patient verbalized understanding of instructions that were reviewed over the telephone.

## 2024-06-19 ENCOUNTER — Other Ambulatory Visit: Payer: Self-pay | Admitting: Radiology

## 2024-06-19 DIAGNOSIS — N200 Calculus of kidney: Secondary | ICD-10-CM

## 2024-06-19 NOTE — H&P (Signed)
 H&P   Chief Complaint: R retained stent   History of Present Illness:   33 y.o. female with medical history significant of chronic anemia, dousing emesis, polycystic kidney disease, history of pyelonephritis, history of frequent UTI, history of sepsis who underwent urinary stent placement in 2022 by Dr. 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 SHe has PCKD and had a cystolithopaxy last week but has large residual stone and stent in place   R retained stent: 04/29/24: will need PCNL, large renal stone with encrusted stent will need PCNL to remove remainder of stone. And stent. We discussed procedure today patient is agreeable we will proceed with PCNL. Patient feels like she has improved urination after the cystolitholapaxy was completed. 06/20/24: R PCNL today  Pt treated with bactrim  prior to the procedure  Past Medical History:  Diagnosis Date   Medical history non-contributory    Polycystic kidney disease    Past Surgical History:  Procedure Laterality Date   CESAREAN SECTION     CESAREAN SECTION N/A 08/09/2012   Procedure: Repeat CESAREAN SECTION of baby boy  at 0431  APGAR 9/9;  Surgeon: Colleen DELENA Na, MD;  Location: [facility];  Service: Obstetrics;  Laterality: N/A;   CESAREAN SECTION N/A 08/23/2013   Procedure: CESAREAN SECTION;  Surgeon: Colleen DELENA Centers, MD;  Location: [facility];  Service: Obstetrics;  Laterality: N/A;   CYSTOSCOPY WITH LITHOLAPAXY N/A 04/21/2024   Procedure: CYSTOSCOPY, WITH BLADDER CALCULUS LITHOLAPAXY;  Surgeon: Colleen Colleen BROCKS, MD;  Location: [facility];  Service: Urology;  Laterality: N/A;   CYSTOSCOPY/URETEROSCOPY/HOLMIUM LASER/STENT PLACEMENT Right 03/02/2021   Procedure: CYSTOSCOPY; RIGHT RETROGRADE PYELOGRAM; RIGTH URETERAL STENT PLACEMENT;  Surgeon: Colleen Zachary NOVAK, MD;  Location: [facility];  Service: Urology;  Laterality: Right;    Home Medications:  No medications prior to  admission.   Allergies: Allergies[1]  Family History  Problem Relation Age of Onset   Kidney disease Father    Diabetes Maternal Grandmother    Other Neg Hx    Social History:  reports that she has never smoked. She has never used smokeless tobacco. She reports that she does not drink alcohol and does not use drugs.  ROS: A complete review of systems was performed.  All systems are negative except for pertinent findings as noted. ROS   Physical Exam:  Vital signs in last 24 hours:   General: NAD Respiratory: normal WOB on RA Cards: RRR per monitor   Laboratory Data:  No results found for this or any previous visit (from the past 24 hours). No results found for this or any previous visit (from the past 240 hours). Creatinine: Recent Labs    06/13/24 1455  CREATININE 0.69    Impression/Assessment:  33 y.o. female with medical history significant of chronic anemia, dousing emesis, polycystic kidney disease, history of pyelonephritis, history of frequent UTI, history of sepsis who underwent urinary stent placement in 2022 by Dr. 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 SHe has PCKD and had a cystolithopaxy last week but has large residual stone and stent in place.  Plan for IR placement of neph tube and R PCNL today   Patient's risk benefits alternatives to procedure including bleeding faction demonstrating structures possible injury to the kidney possible risk of bleeding and blood possibly into the ureter requiring long-term stent we also stated that if we were unable to remove the  stent she may require an open procedure in the future we would discuss this prior to making that decision. Patient agreed to the procedure.  Plan:  R PCNL today   Colleen Oneill 06/19/2024, 3:07 PM      [1] No Known Allergies

## 2024-06-20 NOTE — H&P (Signed)
 "   Chief Complaint: renal stone  Referring Provider(s): Showalter,Victor C  Supervising Physician: Jennefer Rover  Patient Status: [facility] - Out-pt  History of Present Illness: Colleen Oneill is a 33 y.o. female ***  Confirms NPO since MN *** and ride/supervision available for 24 hours. She does not wear CPAP or use supplemental home O2 ***  Denies fever, chills, SHOB, CP, sore throat, N/V, abd pain, blood in stool or urine, abnormal bruising, leg swelling, back pain.  Reports ***  Allergies Reviewed:  Patient has no known allergies.   Patient is Full Code  Past Medical History:  Diagnosis Date   Medical history non-contributory    Polycystic kidney disease     Past Surgical History:  Procedure Laterality Date   CESAREAN SECTION     CESAREAN SECTION N/A 08/09/2012   Procedure: Repeat CESAREAN SECTION of baby boy  at 0431  APGAR 9/9;  Surgeon: Aida DELENA Na, MD;  Location: [facility];  Service: Obstetrics;  Laterality: N/A;   CESAREAN SECTION N/A 08/23/2013   Procedure: CESAREAN SECTION;  Surgeon: Carlin DELENA Centers, MD;  Location: [facility];  Service: Obstetrics;  Laterality: N/A;   CYSTOSCOPY WITH LITHOLAPAXY N/A 04/21/2024   Procedure: CYSTOSCOPY, WITH BLADDER CALCULUS LITHOLAPAXY;  Surgeon: Shane Steffan BROCKS, MD;  Location: [facility];  Service: Urology;  Laterality: N/A;   CYSTOSCOPY/URETEROSCOPY/HOLMIUM LASER/STENT PLACEMENT Right 03/02/2021   Procedure: CYSTOSCOPY; RIGHT RETROGRADE PYELOGRAM; RIGTH URETERAL STENT PLACEMENT;  Surgeon: Rosalind Zachary NOVAK, MD;  Location: [facility];  Service: Urology;  Laterality: Right;      Medications: Prior to Admission medications  Medication Sig Start Date End Date Taking? Authorizing Provider  acetaminophen  (TYLENOL ) 325 MG tablet Take 650 mg by mouth every 6 (six) hours as needed (pain.).    [provider]  bisacodyl  (DULCOLAX) 5 MG EC tablet Take 1 tablet (5 mg total) by mouth daily as needed for moderate constipation. 04/22/24    Arlon Carliss ORN, DO  oxyCODONE -acetaminophen  (PERCOCET) 5-325 MG tablet Take 1 tablet by mouth every 4 (four) hours as needed for severe pain (pain score 7-10). 04/22/24   Arlon Carliss ORN, DO  polyethylene glycol powder (GLYCOLAX /MIRALAX ) 17 GM/SCOOP powder Dissolve 1 capful (17g) in 4-8 ounces of liquid and take by mouth daily. Patient not taking: Reported on 06/07/2024 04/22/24   Arlon Carliss ORN, DO  sulfamethoxazole -trimethoprim  (BACTRIM  DS) 800-160 MG tablet Take 1 tablet by mouth 2 (two) times daily. 04/22/24   Arlon Carliss ORN, DO     Family History  Problem Relation Age of Onset   Kidney disease Father    Diabetes Maternal Grandmother    Other Neg Hx     Social History   Socioeconomic History   Marital status: Single    Spouse name: Not on file   Number of children: Not on file   Years of education: Not on file   Highest education level: Not on file  Occupational History   Not on file  Tobacco Use   Smoking status: Never   Smokeless tobacco: Never  Vaping Use   Vaping status: Never Used  Substance and Sexual Activity   Alcohol use: No   Drug use: No   Sexual activity: Not Currently    Birth control/protection: None  Other Topics Concern   Not on file  Social History Narrative   Not on file   Social Drivers of Health   Tobacco Use: Low Risk (06/13/2024)   Patient History    Smoking Tobacco Use: Never  Smokeless Tobacco Use: Never    Passive Exposure: Not on file  Financial Resource Strain: Not on file  Food Insecurity: Food Insecurity Present (04/21/2024)   Epic    Worried About Programme Researcher, Broadcasting/film/video in the Last Year: Sometimes true    [employer] of Food in the Last Year: Never true  Transportation Needs: Unmet Transportation Needs (04/21/2024)   Epic    Lack of Transportation (Medical): Yes    Lack of Transportation (Non-Medical): No  Physical Activity: Not on file  Stress: Not on file  Social Connections: Not on file  Depression (EYV7-0): Not on file   Alcohol Screen: Not on file  Housing: High Risk (04/21/2024)   Epic    Unable to Pay for Housing in the Last Year: No    Number of Times Moved in the Last Year: 3    Homeless in the Last Year: Yes  Utilities: Not At Risk (04/21/2024)   Epic    Threatened with loss of utilities: No  Health Literacy: Not on file     Review of Systems: A 12 point ROS discussed and pertinent positives are indicated in the HPI above.  All other systems are negative.    Vital Signs: LMP 05/20/2024     Physical Exam  Imaging: No results found.  Labs:  CBC: Recent Labs    02/07/24 1419 04/20/24 1915 04/22/24 0449 06/13/24 1455  WBC 6.1 7.3 5.4 6.3  HGB 12.1 12.7 10.6* 11.7*  HCT 35.9* 39.6 32.4* 35.3*  PLT 304 302 242 310    COAGS: Recent Labs    06/13/24 1455  INR 1.1    BMP: Recent Labs    02/07/24 1419 04/20/24 1915 04/22/24 0449 06/13/24 1455  NA 140 142 138 140  K 3.5 3.9 4.0 3.8  CL 107 104 104 105  CO2 21* 27 24 25   GLUCOSE 109* 130* 112* 131*  BUN 23* 22* 20 23*  CALCIUM  9.4 10.0 9.5 9.9  CREATININE 0.78 0.75 0.63 0.69  GFRNONAA >60 >60 >60 >60    LIVER FUNCTION TESTS: Recent Labs    02/05/24 1759 02/07/24 1419 04/20/24 1915 04/22/24 0449  BILITOT 0.5 0.7 0.5 0.5  AST 14* 16 20 15   ALT 9 8 7 6   ALKPHOS 26* 27* 38 31*  PROT 7.2 7.3 7.9 6.8  ALBUMIN 4.4 4.4 4.9 4.3    TUMOR MARKERS: No results for input(s): AFPTM, CEA, CA199, CHROMGRNA in the last 8760 hours.  Assessment and Plan:  Request for image guided left percutaneous nephrostomy tube placement. No contraindications for procedure identified in ROS, physical exam, or review of pre-sedation considerations.  Labs reviewed and within acceptable range Imaging available and reviewed VSS, afebrile Abx indicated ***   Risks and benefits of left percutaneous nephrostomy tube placement was discussed with the patient including, but not limited to, infection, bleeding, significant  bleeding causing loss or decrease in renal function or damage to adjacent structures.   All of the patient's questions were answered, patient is agreeable to proceed.  Consent signed and in chart.   Thank you for allowing our service to participate in RAYONNA HELDMAN 's care.    Electronically Signed: Auther Lyerly B Laquanda Bick, NP   06/20/2024, 11:14 AM     I spent a total of 15 Minutes in face to face in clinical consultation, greater than 50% of which was counseling/coordinating care for image guided left percutaneous nephrostomy tube placement.   (A copy of this note was sent to the  referring provider and the time of visit.)  "

## 2024-06-21 ENCOUNTER — Ambulatory Visit (HOSPITAL_COMMUNITY): Payer: Self-pay | Admitting: Medical

## 2024-06-21 ENCOUNTER — Observation Stay (HOSPITAL_COMMUNITY)
Admission: RE | Admit: 2024-06-21 | Discharge: 2024-06-23 | Disposition: A | Discharge status: Home / Self Care | Source: Home / Self Care | Attending: Urology | Admitting: Urology

## 2024-06-21 ENCOUNTER — Observation Stay (HOSPITAL_COMMUNITY)

## 2024-06-21 ENCOUNTER — Ambulatory Visit (HOSPITAL_COMMUNITY)
Admission: RE | Admit: 2024-06-21 | Discharge: 2024-06-21 | Disposition: A | Discharge status: Home / Self Care | Source: Ambulatory Visit | Attending: Urology | Admitting: Urology

## 2024-06-21 ENCOUNTER — Encounter (HOSPITAL_COMMUNITY)
Admission: RE | Disposition: A | Discharge status: Home / Self Care | Payer: Self-pay | Source: Home / Self Care | Attending: Urology

## 2024-06-21 ENCOUNTER — Ambulatory Visit (HOSPITAL_COMMUNITY)

## 2024-06-21 ENCOUNTER — Other Ambulatory Visit: Payer: Self-pay

## 2024-06-21 ENCOUNTER — Ambulatory Visit (HOSPITAL_COMMUNITY)
Admission: RE | Admit: 2024-06-21 | Discharge: 2024-06-21 | Disposition: A | Discharge status: Home / Self Care | Source: Ambulatory Visit | Admitting: Urology

## 2024-06-21 ENCOUNTER — Other Ambulatory Visit (HOSPITAL_COMMUNITY)

## 2024-06-21 ENCOUNTER — Ambulatory Visit (HOSPITAL_COMMUNITY): Payer: Self-pay | Admitting: Anesthesiology

## 2024-06-21 ENCOUNTER — Encounter (HOSPITAL_COMMUNITY): Payer: Self-pay | Admitting: Urology

## 2024-06-21 DIAGNOSIS — N2889 Other specified disorders of kidney and ureter: Secondary | ICD-10-CM

## 2024-06-21 DIAGNOSIS — N200 Calculus of kidney: Principal | ICD-10-CM

## 2024-06-21 DIAGNOSIS — Z538 Procedure and treatment not carried out for other reasons: Secondary | ICD-10-CM | POA: Diagnosis not present

## 2024-06-21 DIAGNOSIS — Y829 Unspecified medical devices associated with adverse incidents: Secondary | ICD-10-CM | POA: Insufficient documentation

## 2024-06-21 DIAGNOSIS — T83192A Other mechanical complication of urinary stent, initial encounter: Principal | ICD-10-CM | POA: Insufficient documentation

## 2024-06-21 DIAGNOSIS — Z01818 Encounter for other preprocedural examination: Secondary | ICD-10-CM

## 2024-06-21 HISTORY — PX: IR URETERAL STENT LEFT NEW ACCESS W/O SEP NEPHROSTOMY CATH: IMG6075

## 2024-06-21 HISTORY — PX: NEPHROLITHOTOMY: SHX5134

## 2024-06-21 LAB — CBC WITH DIFFERENTIAL/PLATELET
Abs Immature Granulocytes: 0.01 K/uL (ref 0.00–0.07)
Basophils Absolute: 0 K/uL (ref 0.0–0.1)
Basophils Relative: 1 %
Eosinophils Absolute: 0 K/uL (ref 0.0–0.5)
Eosinophils Relative: 1 %
HCT: 35.1 % — ABNORMAL LOW (ref 36.0–46.0)
Hemoglobin: 11.5 g/dL — ABNORMAL LOW (ref 12.0–15.0)
Immature Granulocytes: 0 %
Lymphocytes Relative: 31 %
Lymphs Abs: 1.7 K/uL (ref 0.7–4.0)
MCH: 32.3 pg (ref 26.0–34.0)
MCHC: 32.8 g/dL (ref 30.0–36.0)
MCV: 98.6 fL (ref 80.0–100.0)
Monocytes Absolute: 0.3 K/uL (ref 0.1–1.0)
Monocytes Relative: 6 %
Neutro Abs: 3.4 K/uL (ref 1.7–7.7)
Neutrophils Relative %: 61 %
Platelets: 297 K/uL (ref 150–400)
RBC: 3.56 MIL/uL — ABNORMAL LOW (ref 3.87–5.11)
RDW: 11.9 % (ref 11.5–15.5)
WBC: 5.5 K/uL (ref 4.0–10.5)
nRBC: 0 % (ref 0.0–0.2)

## 2024-06-21 LAB — BASIC METABOLIC PANEL WITH GFR
Anion gap: 12 (ref 5–15)
Anion gap: 12 (ref 5–15)
BUN: 21 mg/dL — ABNORMAL HIGH (ref 6–20)
BUN: 20 mg/dL (ref 6–20)
CO2: 26 mmol/L (ref 22–32)
CO2: 23 mmol/L (ref 22–32)
Calcium: 10.3 mg/dL (ref 8.9–10.3)
Calcium: 9.1 mg/dL (ref 8.9–10.3)
Chloride: 106 mmol/L (ref 98–111)
Chloride: 106 mmol/L (ref 98–111)
Creatinine, Ser: 0.74 mg/dL (ref 0.44–1.00)
Creatinine, Ser: 0.72 mg/dL (ref 0.44–1.00)
GFR, Estimated: >60 mL/min
GFR, Estimated: >60 mL/min
Glucose, Bld: 100 mg/dL — ABNORMAL HIGH (ref 70–99)
Glucose, Bld: 86 mg/dL (ref 70–99)
Potassium: 4.2 mmol/L (ref 3.5–5.1)
Potassium: 4.1 mmol/L (ref 3.5–5.1)
Sodium: 144 mmol/L (ref 135–145)
Sodium: 140 mmol/L (ref 135–145)

## 2024-06-21 LAB — CBC
HCT: 32.9 % — ABNORMAL LOW (ref 36.0–46.0)
Hemoglobin: 11 g/dL — ABNORMAL LOW (ref 12.0–15.0)
MCH: 31.9 pg (ref 26.0–34.0)
MCHC: 33.4 g/dL (ref 30.0–36.0)
MCV: 95.4 fL (ref 80.0–100.0)
Platelets: 287 K/uL (ref 150–400)
RBC: 3.45 MIL/uL — ABNORMAL LOW (ref 3.87–5.11)
RDW: 12 % (ref 11.5–15.5)
WBC: 13.4 K/uL — ABNORMAL HIGH (ref 4.0–10.5)
nRBC: 0 % (ref 0.0–0.2)

## 2024-06-21 LAB — PROTIME-INR
INR: 1 (ref 0.8–1.2)
Prothrombin Time: 13.6 s (ref 11.4–15.2)

## 2024-06-21 LAB — POCT PREGNANCY, URINE: Preg Test, Ur: NEGATIVE

## 2024-06-21 SURGERY — NEPHROLITHOTOMY
Anesthesia: General | Laterality: Right

## 2024-06-21 MED ORDER — SODIUM CHLORIDE 0.9 % IV SOLN: INTRAVENOUS | Status: DC

## 2024-06-21 MED ORDER — PROPOFOL 10 MG/ML IV BOLUS
INTRAVENOUS | Status: AC
  Filled 2024-06-21: qty 20

## 2024-06-21 MED ORDER — OXYCODONE HCL 5 MG PO TABS
5.0000 mg | ORAL_TABLET | ORAL | Status: DC | PRN
  Administered 2024-06-22 – 2024-06-23 (×3): 5 mg via ORAL
  Filled 2024-06-21 (×3): qty 1

## 2024-06-21 MED ORDER — CHLORHEXIDINE GLUCONATE 0.12 % MT SOLN
15.0000 mL | Freq: Once | OROMUCOSAL | Status: AC
  Administered 2024-06-21: 15 mL via OROMUCOSAL

## 2024-06-21 MED ORDER — FENTANYL CITRATE (PF) 100 MCG/2ML IJ SOLN
INTRAMUSCULAR | Status: AC
  Filled 2024-06-21: qty 2

## 2024-06-21 MED ORDER — DOCUSATE SODIUM 100 MG PO CAPS
100.0000 mg | ORAL_CAPSULE | Freq: Two times a day (BID) | ORAL | Status: DC
  Administered 2024-06-21 – 2024-06-23 (×4): 100 mg via ORAL
  Filled 2024-06-21 (×4): qty 1

## 2024-06-21 MED ORDER — ONDANSETRON HCL 4 MG/2ML IJ SOLN
INTRAMUSCULAR | Status: AC
  Filled 2024-06-21: qty 2

## 2024-06-21 MED ORDER — LIDOCAINE 2% (20 MG/ML) 5 ML SYRINGE
INTRAMUSCULAR | Status: DC | PRN
  Administered 2024-06-21: 100 mg via INTRAVENOUS

## 2024-06-21 MED ORDER — METHOCARBAMOL 500 MG PO TABS
ORAL_TABLET | ORAL | Status: AC
  Filled 2024-06-21: qty 2

## 2024-06-21 MED ORDER — SODIUM CHLORIDE 0.9 % IV SOLN
INTRAVENOUS | Status: DC
  Administered 2024-06-21: 1000 mL via INTRAVENOUS

## 2024-06-21 MED ORDER — ONDANSETRON HCL 4 MG/2ML IJ SOLN
INTRAMUSCULAR | Status: DC | PRN
  Administered 2024-06-21: 4 mg via INTRAVENOUS

## 2024-06-21 MED ORDER — MIDAZOLAM HCL (PF) 2 MG/2ML IJ SOLN
INTRAMUSCULAR | Status: DC | PRN
  Administered 2024-06-21: 2 mg via INTRAVENOUS

## 2024-06-21 MED ORDER — LIDOCAINE-EPINEPHRINE 1 %-1:100000 IJ SOLN
INTRAMUSCULAR | Status: AC
  Filled 2024-06-21: qty 20

## 2024-06-21 MED ORDER — ACETAMINOPHEN 10 MG/ML IV SOLN: 1000.0000 mg | Freq: Once | INTRAVENOUS | Status: DC | PRN

## 2024-06-21 MED ORDER — SODIUM CHLORIDE 0.9 % IV SOLN
INTRAVENOUS | Status: AC
  Filled 2024-06-21: qty 10

## 2024-06-21 MED ORDER — FENTANYL CITRATE (PF) 50 MCG/ML IJ SOSY
25.0000 ug | PREFILLED_SYRINGE | INTRAMUSCULAR | Status: DC | PRN
  Administered 2024-06-21 (×3): 50 ug via INTRAVENOUS

## 2024-06-21 MED ORDER — SENNOSIDES-DOCUSATE SODIUM 8.6-50 MG PO TABS
2.0000 | ORAL_TABLET | Freq: Every day | ORAL | Status: DC
  Administered 2024-06-21 – 2024-06-22 (×2): 2 via ORAL
  Filled 2024-06-21 (×2): qty 2

## 2024-06-21 MED ORDER — ORAL CARE MOUTH RINSE: 15.0000 mL | Freq: Once | OROMUCOSAL | Status: AC

## 2024-06-21 MED ORDER — SODIUM CHLORIDE 0.9 % IR SOLN
Status: DC | PRN
  Administered 2024-06-21: 6000 mL

## 2024-06-21 MED ORDER — SUGAMMADEX SODIUM 200 MG/2ML IV SOLN
INTRAVENOUS | Status: AC
  Filled 2024-06-21: qty 2

## 2024-06-21 MED ORDER — DEXAMETHASONE SOD PHOSPHATE PF 10 MG/ML IJ SOLN
INTRAMUSCULAR | Status: DC | PRN
  Administered 2024-06-21: 5 mg via INTRAVENOUS

## 2024-06-21 MED ORDER — ACETAMINOPHEN 500 MG PO TABS
1000.0000 mg | ORAL_TABLET | Freq: Four times a day (QID) | ORAL | Status: AC
  Administered 2024-06-21 – 2024-06-22 (×4): 1000 mg via ORAL
  Filled 2024-06-21 (×3): qty 2

## 2024-06-21 MED ORDER — FENTANYL CITRATE (PF) 100 MCG/2ML IJ SOLN
INTRAMUSCULAR | Status: AC | PRN
  Administered 2024-06-21: 50 ug via INTRAVENOUS
  Administered 2024-06-21 (×3): 25 ug via INTRAVENOUS

## 2024-06-21 MED ORDER — SODIUM CHLORIDE 0.9 % IV SOLN
1.0000 g | Freq: Once | INTRAVENOUS | Status: AC
  Administered 2024-06-21: 1 g via INTRAVENOUS

## 2024-06-21 MED ORDER — LIDOCAINE HCL (PF) 2 % IJ SOLN
INTRAMUSCULAR | Status: AC
  Filled 2024-06-21: qty 5

## 2024-06-21 MED ORDER — PROPOFOL 10 MG/ML IV BOLUS
INTRAVENOUS | Status: DC | PRN
  Administered 2024-06-21: 100 mg via INTRAVENOUS

## 2024-06-21 MED ORDER — MIDAZOLAM HCL 2 MG/2ML IJ SOLN
INTRAMUSCULAR | Status: AC
  Filled 2024-06-21: qty 2

## 2024-06-21 MED ORDER — HYDROMORPHONE HCL 1 MG/ML IJ SOLN
INTRAMUSCULAR | Status: AC
  Administered 2024-06-21: 0.5 mg via INTRAVENOUS
  Filled 2024-06-21: qty 1

## 2024-06-21 MED ORDER — ONDANSETRON HCL 4 MG/2ML IJ SOLN: 4.0000 mg | Freq: Once | INTRAMUSCULAR | Status: DC | PRN

## 2024-06-21 MED ORDER — ROCURONIUM BROMIDE 10 MG/ML (PF) SYRINGE
PREFILLED_SYRINGE | INTRAVENOUS | Status: AC
  Filled 2024-06-21: qty 10

## 2024-06-21 MED ORDER — SUGAMMADEX SODIUM 200 MG/2ML IV SOLN
INTRAVENOUS | Status: DC | PRN
  Administered 2024-06-21: 120 mg via INTRAVENOUS

## 2024-06-21 MED ORDER — FENTANYL CITRATE (PF) 50 MCG/ML IJ SOSY
PREFILLED_SYRINGE | INTRAMUSCULAR | Status: AC
  Filled 2024-06-21: qty 3

## 2024-06-21 MED ORDER — IOHEXOL 300 MG/ML  SOLN
INTRAMUSCULAR | Status: DC | PRN
  Administered 2024-06-21: 5 mL

## 2024-06-21 MED ORDER — IOHEXOL 300 MG/ML  SOLN
50.0000 mL | Freq: Once | INTRAMUSCULAR | Status: AC | PRN
  Administered 2024-06-21: 20 mL

## 2024-06-21 MED ORDER — LACTATED RINGERS IV SOLN: INTRAVENOUS | Status: DC

## 2024-06-21 MED ORDER — OXYCODONE HCL 5 MG/5ML PO SOLN: 5.0000 mg | Freq: Once | ORAL | Status: DC | PRN

## 2024-06-21 MED ORDER — ACETAMINOPHEN 325 MG PO TABS
650.0000 mg | ORAL_TABLET | ORAL | Status: DC | PRN
  Administered 2024-06-22: 650 mg via ORAL
  Filled 2024-06-21: qty 2

## 2024-06-21 MED ORDER — DIPHENHYDRAMINE HCL 50 MG/ML IJ SOLN: 12.5000 mg | Freq: Four times a day (QID) | INTRAMUSCULAR | Status: DC | PRN

## 2024-06-21 MED ORDER — ROCURONIUM BROMIDE 10 MG/ML (PF) SYRINGE
PREFILLED_SYRINGE | INTRAVENOUS | Status: DC | PRN
  Administered 2024-06-21: 50 mg via INTRAVENOUS
  Administered 2024-06-21: 10 mg via INTRAVENOUS

## 2024-06-21 MED ORDER — PROPOFOL 1000 MG/100ML IV EMUL
INTRAVENOUS | Status: AC
  Filled 2024-06-21: qty 100

## 2024-06-21 MED ORDER — METHOCARBAMOL 500 MG PO TABS
1000.0000 mg | ORAL_TABLET | Freq: Four times a day (QID) | ORAL | Status: DC
  Administered 2024-06-21 – 2024-06-23 (×7): 1000 mg via ORAL
  Filled 2024-06-21 (×6): qty 2

## 2024-06-21 MED ORDER — MIDAZOLAM HCL (PF) 2 MG/2ML IJ SOLN
INTRAMUSCULAR | Status: AC | PRN
  Administered 2024-06-21: 1 mg via INTRAVENOUS
  Administered 2024-06-21 (×2): .5 mg via INTRAVENOUS

## 2024-06-21 MED ORDER — HYDROMORPHONE HCL 1 MG/ML IJ SOLN
0.5000 mg | INTRAMUSCULAR | Status: DC | PRN
  Administered 2024-06-21 – 2024-06-22 (×2): 1 mg via INTRAVENOUS
  Filled 2024-06-21 (×2): qty 1

## 2024-06-21 MED ORDER — HYDROMORPHONE HCL 1 MG/ML IJ SOLN: 0.5000 mg | Freq: Once | INTRAMUSCULAR | Status: AC

## 2024-06-21 MED ORDER — ACETAMINOPHEN 500 MG PO TABS
ORAL_TABLET | ORAL | Status: AC
  Filled 2024-06-21: qty 2

## 2024-06-21 MED ORDER — GENTAMICIN SULFATE 40 MG/ML IJ SOLN
5.0000 mg/kg | INTRAVENOUS | Status: AC
  Administered 2024-06-21: 260 mg via INTRAVENOUS
  Filled 2024-06-21: qty 6.5

## 2024-06-21 MED ORDER — 0.9 % SODIUM CHLORIDE (POUR BTL) OPTIME
TOPICAL | Status: DC | PRN
  Administered 2024-06-21: 1000 mL

## 2024-06-21 MED ORDER — OXYCODONE HCL 5 MG PO TABS: 5.0000 mg | ORAL_TABLET | Freq: Once | ORAL | Status: DC | PRN

## 2024-06-21 MED ORDER — PROPOFOL 500 MG/50ML IV EMUL
INTRAVENOUS | Status: DC | PRN
  Administered 2024-06-21: 150 ug/kg/min via INTRAVENOUS

## 2024-06-21 MED ORDER — LIDOCAINE-EPINEPHRINE 1 %-1:100000 IJ SOLN
20.0000 mL | Freq: Once | INTRAMUSCULAR | Status: AC
  Administered 2024-06-21: 9 mL via INTRADERMAL

## 2024-06-21 MED ORDER — DIPHENHYDRAMINE HCL 12.5 MG/5ML PO ELIX: 12.5000 mg | ORAL_SOLUTION | Freq: Four times a day (QID) | ORAL | Status: DC | PRN

## 2024-06-21 MED ORDER — ONDANSETRON HCL 4 MG/2ML IJ SOLN
4.0000 mg | INTRAMUSCULAR | Status: DC | PRN
  Administered 2024-06-21 – 2024-06-23 (×6): 4 mg via INTRAVENOUS
  Filled 2024-06-21 (×6): qty 2

## 2024-06-21 MED ORDER — FENTANYL CITRATE (PF) 100 MCG/2ML IJ SOLN
INTRAMUSCULAR | Status: DC | PRN
  Administered 2024-06-21: 50 ug via INTRAVENOUS
  Administered 2024-06-21: 100 ug via INTRAVENOUS
  Administered 2024-06-21: 50 ug via INTRAVENOUS

## 2024-06-21 MED ORDER — VANCOMYCIN HCL IN DEXTROSE 1-5 GM/200ML-% IV SOLN
1000.0000 mg | INTRAVENOUS | Status: AC
  Administered 2024-06-21: 1000 mg via INTRAVENOUS
  Filled 2024-06-21: qty 200

## 2024-06-21 SURGICAL SUPPLY — 51 items
BAG URINE DRAIN 2000ML AR STRL (UROLOGICAL SUPPLIES) IMPLANT
BASKET STONE NCOMPASS (UROLOGICAL SUPPLIES) IMPLANT
BASKET ZERO TIP NITINOL 2.4FR (BASKET) IMPLANT
BENZOIN TINCTURE PRP APPL 2/3 (GAUZE/BANDAGES/DRESSINGS) IMPLANT
BLADE SURG 15 STRL LF DISP TIS (BLADE) ×1 IMPLANT
BOOTIES KNEE HIGH SLOAN (MISCELLANEOUS) IMPLANT
CATH FOLEY 2W COUNCIL 20FR 5CC (CATHETERS) IMPLANT
CATH FOLEY 2WAY SLVR 5CC 18FR (CATHETERS) IMPLANT
CATH FOLEY 2WAY SLVR 5CC 20FR (CATHETERS) IMPLANT
CATH STENT KAYE NEPHR TAMP (CATHETERS) IMPLANT
CATH ULTRATHANE 14FR (CATHETERS) IMPLANT
CATH URETERAL DUAL LUMEN 10F (MISCELLANEOUS) ×1 IMPLANT
CATH UROLOGY TORQUE 65 (CATHETERS) IMPLANT
CATH X-FORCE N30 NEPHROSTOMY (TUBING) ×1 IMPLANT
CHLORAPREP W/TINT 26 (MISCELLANEOUS) ×1 IMPLANT
DRAPE C-ARM 42X120 X-RAY (DRAPES) ×1 IMPLANT
DRAPE LINGEMAN PERC (DRAPES) ×1 IMPLANT
DRAPE SURG IRRIG POUCH 19X23 (DRAPES) ×2 IMPLANT
DRSG TEGADERM 4X4.75 (GAUZE/BANDAGES/DRESSINGS) IMPLANT
DRSG TEGADERM 8X12 (GAUZE/BANDAGES/DRESSINGS) IMPLANT
EXTRACTOR STONE 1.7FRX115CM (UROLOGICAL SUPPLIES) IMPLANT
GAUZE PAD ABD 8X10 STRL (GAUZE/BANDAGES/DRESSINGS) IMPLANT
GAUZE SPONGE 4X4 12PLY STRL (GAUZE/BANDAGES/DRESSINGS) IMPLANT
GLOVE BIO SURGEON STRL SZ8 (GLOVE) ×1 IMPLANT
GOWN STRL REUS W/ TWL XL LVL3 (GOWN DISPOSABLE) ×1 IMPLANT
GUIDEWIRE AMPLAZ .035X145 (WIRE) ×1 IMPLANT
GUIDEWIRE ANG ZIPWIRE 038X150 (WIRE) IMPLANT
GUIDEWIRE STR DUAL SENSOR (WIRE) ×1 IMPLANT
KIT BASIN OR (CUSTOM PROCEDURE TRAY) ×1 IMPLANT
KIT PROBE TRILOGY 3.9X350 (MISCELLANEOUS) IMPLANT
KIT TURNOVER KIT A (KITS) ×1 IMPLANT
LASER FIB FLEXIVA PULSE ID 365 (Laser) IMPLANT
MANIFOLD NEPTUNE II (INSTRUMENTS) ×1 IMPLANT
MAT ABSORB FLUID 56X50 GRAY (MISCELLANEOUS) ×1 IMPLANT
NS IRRIG 1000ML POUR BTL (IV SOLUTION) ×1 IMPLANT
PACK CYSTO (CUSTOM PROCEDURE TRAY) ×1 IMPLANT
PROTECTOR NERVE ULNAR (MISCELLANEOUS) ×3 IMPLANT
SHEATH DILATOR SET 8/10 (MISCELLANEOUS) IMPLANT
SPONGE T-LAP 4X18 ~~LOC~~+RFID (SPONGE) ×1 IMPLANT
SUT CHROMIC 3 0 SH 27 (SUTURE) IMPLANT
SUT ETHILON 3 0 PS 1 (SUTURE) IMPLANT
SYR 20ML LL LF (SYRINGE) ×1 IMPLANT
SYR 50ML LL SCALE MARK (SYRINGE) ×1 IMPLANT
TAPE CLOTH SURG 4X10 WHT LF (GAUZE/BANDAGES/DRESSINGS) IMPLANT
TOWEL OR DSP ST BLU DLX 10/PK (DISPOSABLE) ×1 IMPLANT
TRACTIP FLEXIVA PULS ID 200XHI (Laser) IMPLANT
TRAY FOLEY MTR SLVR 16FR STAT (SET/KITS/TRAYS/PACK) IMPLANT
TUBE CONNECTING VINYL 14FR 30C (TUBING) IMPLANT
TUBING CONNECTING 10 (TUBING) ×1 IMPLANT
TUBING STONE CATCHER TRILOGY (MISCELLANEOUS) IMPLANT
TUBING UROLOGY SET (TUBING) ×1 IMPLANT

## 2024-06-21 NOTE — Anesthesia Preprocedure Evaluation (Signed)
"                                    Anesthesia Evaluation  Patient identified by MRN, date of birth, ID band Patient awake    Reviewed: Allergy & Precautions, NPO status , Patient's Chart, lab work & pertinent test results, reviewed documented beta blocker date and time   Airway Mallampati: II  TM Distance: >3 FB     Dental  (+) Poor Dentition   Pulmonary    breath sounds clear to auscultation       Cardiovascular (-) angina (-) CAD and (-) Past MI  Rhythm:Regular Rate:Normal     Neuro/Psych neg Seizures    GI/Hepatic ,,,(+) neg Cirrhosis        Endo/Other    Renal/GU Renal diseasePCKD     Musculoskeletal   Abdominal   Peds  Hematology  (+) Blood dyscrasia, anemia   Anesthesia Other Findings   Reproductive/Obstetrics                              Anesthesia Physical Anesthesia Plan  ASA: 2  Anesthesia Plan: General   Post-op Pain Management:    Induction: Intravenous  PONV Risk Score and Plan: 3 and Ondansetron  and Dexamethasone   Airway Management Planned: Oral ETT  Additional Equipment:   Intra-op Plan:   Post-operative Plan: Extubation in OR  Informed Consent: I have reviewed the patients History and Physical, chart, labs and discussed the procedure including the risks, benefits and alternatives for the proposed anesthesia with the patient or authorized representative who has indicated his/her understanding and acceptance.     Dental advisory given  Plan Discussed with: CRNA  Anesthesia Plan Comments:          Anesthesia Quick Evaluation  "

## 2024-06-21 NOTE — Transfer of Care (Signed)
 Immediate Anesthesia Transfer of Care Note  Patient: Colleen Oneill  Procedure(s) Performed: NEPHROLITHOTOMY (Right)  Patient Location: PACU  Anesthesia Type:General  Level of Consciousness: awake and alert   Airway & Oxygen Therapy: Patient Spontanous Breathing and Patient connected to face mask oxygen  Post-op Assessment: Report given to RN and Post -op Vital signs reviewed and stable  Post vital signs: Reviewed and stable  Last Vitals:  Vitals Value Taken Time  BP 110/69 06/21/24 16:18  Temp    Pulse 100 06/21/24 16:18  Resp 12 06/21/24 16:20  SpO2 100 % 06/21/24 16:18  Vitals shown include unfiled device data.  Last Pain:  Vitals:   06/21/24 1430  TempSrc:   PainSc: 7       Patients Stated Pain Goal: 5 (06/21/24 1330)  Complications: No notable events documented.

## 2024-06-21 NOTE — Procedures (Signed)
 Interventional Radiology Procedure Note  Procedure: Ultrasound and fluoroscopic guided right percutaneous nephrostomy placement  Findings: Please refer to procedural dictation for full description. 5 Fr KMP catheter via right superior pole access.  Catheter tip in urinary bladder.  Complications: None immediate  Estimated Blood Loss: < 5 mL  Recommendations: To OR with Dr. Shane.   Ester Sides, MD

## 2024-06-21 NOTE — Op Note (Signed)
 Operative Note   Preoperative diagnosis: Retained right ureteral stent  Postop diagnosis: Same   Procedures performed: Attempted right PCNL  Operative findings: Due to bed of right ureteral access the dual-lumen catheter and a 10 dilator cannot make the bed and get access to the renal pelvis multiple attempts were made but as continued attempts access was lost.  Anesthesia: General  Fluids: Per anesthesia  UOP: Paresthesia  EBL: 10 cc  Drains: 18 French urethral catheter  Specimens: None  Complications: Inability to get access and remove retained stent.  Indication procedure Colleen Oneill is a 33 year old female with a history of obstructive pyelonephritis who was stented several years ago by Dr. Rosalind she never had follow-up and developed a severe calcified stent she is already had the cystolitholapaxy remove the bladder stone but the kidney component needs to be removed.  She is here for PCNL remove the kidney component today.  Procedure in detail: Patient received a preoperative percutaneous access from IR prior to procedure after she received this purpose consent was obtained confirming correct patient and procedure.  She was then taken to the operative suite placed in the prone position with her abdomen appropriately padded as well as legs properly padded and the split leg position arms were placed in This position with appropriate padding underneath the elbows and  axilla.  The patient's back and groin were prepped appropriately and an 48 French catheter was placed in the urethra.  The patient was then prepped draped in usual sterile fashion.  Timeout was then performed prior to procedure.  A wire was then placed through the existing nephrostomy tube into the bladder prior placement was confirmed with fluoroscopy.  An incision was then made in the back to make the incision wire a dual-lumen catheter was then attempted to be placed over the wire.  This was complicated due to the  curve at which the access was placed as well and the wire was advanced it would curve in the retroperitoneum we were eventually able to get some access where a retrograde pyelogram was performed and a a Super Stiff wire was able to get access into the kidney but unable to get down the ureter.  As we attempted to place the balloon due to the curvature of the access the the balloon would not get access is only bending in the retroperitoneum multiple attempts were tried to do this as well as advance the wire using a 18 dilator and the dual-lumen catheter after multiple hence the wire continued to be released eventually the wires were unable to be placed and fell out of the kidney.  At this time we discussed her options into the attempting a second access but due to the stent being in the location and unable to get retrograde access decision was made to stop the procedure and plan to repeat a PCNL in the future.  A 3-0 Chromic Gut suture was used to sew the incision in the posterior there was no significant bleeding the patient was in extubated operative suite and taken the PACU in stable condition.  Disposition: Patient will be admitted to the urology team she will void trial in the morning.

## 2024-06-21 NOTE — Anesthesia Postprocedure Evaluation (Signed)
"   Anesthesia Post Note  Patient: Colleen Oneill  Procedure(s) Performed: NEPHROLITHOTOMY (Right)     Patient location during evaluation: PACU Anesthesia Type: General Level of consciousness: awake and alert Pain management: pain level controlled Vital Signs Assessment: post-procedure vital signs reviewed and stable Respiratory status: spontaneous breathing, nonlabored ventilation, respiratory function stable and patient connected to nasal cannula oxygen Cardiovascular status: blood pressure returned to baseline and stable Postop Assessment: no apparent nausea or vomiting Anesthetic complications: no   No notable events documented.  Last Vitals:  Vitals:   06/21/24 1700 06/21/24 1715  BP: 121/66 94/76  Pulse: 72 62  Resp: 12 12  Temp:    SpO2: 98% 96%    Last Pain:  Vitals:   06/21/24 1715  TempSrc:   PainSc: Asleep                 Colleen Oneill      "

## 2024-06-21 NOTE — Anesthesia Procedure Notes (Signed)
 Procedure Name: Intubation Date/Time: 06/21/2024 2:53 PM  Performed by: Therisa Doyal CROME, CRNAPre-anesthesia Checklist: Patient identified, Emergency Drugs available, Suction available and Patient being monitored Patient Re-evaluated:Patient Re-evaluated prior to induction Oxygen Delivery Method: Circle system utilized Preoxygenation: Pre-oxygenation with 100% oxygen Induction Type: IV induction Ventilation: Mask ventilation without difficulty Laryngoscope Size: Miller and 3 Grade View: Grade I Tube type: Oral Tube size: 7.5 mm Number of attempts: 1 Airway Equipment and Method: Stylet Placement Confirmation: ETT inserted through vocal cords under direct vision, positive ETCO2 and breath sounds checked- equal and bilateral Secured at: 23 cm Tube secured with: Tape Dental Injury: Teeth and Oropharynx as per pre-operative assessment

## 2024-06-22 ENCOUNTER — Encounter (HOSPITAL_COMMUNITY): Payer: Self-pay | Admitting: Urology

## 2024-06-22 DIAGNOSIS — T83192A Other mechanical complication of urinary stent, initial encounter: Secondary | ICD-10-CM | POA: Diagnosis not present

## 2024-06-22 LAB — BASIC METABOLIC PANEL WITH GFR
Anion gap: 10 (ref 5–15)
BUN: 18 mg/dL (ref 6–20)
CO2: 23 mmol/L (ref 22–32)
Calcium: 8.8 mg/dL — ABNORMAL LOW (ref 8.9–10.3)
Chloride: 106 mmol/L (ref 98–111)
Creatinine, Ser: 0.59 mg/dL (ref 0.44–1.00)
GFR, Estimated: >60 mL/min
Glucose, Bld: 108 mg/dL — ABNORMAL HIGH (ref 70–99)
Potassium: 4.1 mmol/L (ref 3.5–5.1)
Sodium: 139 mmol/L (ref 135–145)

## 2024-06-22 LAB — CBC
HCT: 27.9 % — ABNORMAL LOW (ref 36.0–46.0)
HCT: 27.8 % — ABNORMAL LOW (ref 36.0–46.0)
Hemoglobin: 9.3 g/dL — ABNORMAL LOW (ref 12.0–15.0)
Hemoglobin: 9.5 g/dL — ABNORMAL LOW (ref 12.0–15.0)
MCH: 31.8 pg (ref 26.0–34.0)
MCH: 33 pg (ref 26.0–34.0)
MCHC: 33.3 g/dL (ref 30.0–36.0)
MCHC: 34.2 g/dL (ref 30.0–36.0)
MCV: 95.5 fL (ref 80.0–100.0)
MCV: 96.5 fL (ref 80.0–100.0)
Platelets: 256 K/uL (ref 150–400)
Platelets: 232 K/uL (ref 150–400)
RBC: 2.92 MIL/uL — ABNORMAL LOW (ref 3.87–5.11)
RBC: 2.88 MIL/uL — ABNORMAL LOW (ref 3.87–5.11)
RDW: 11.9 % (ref 11.5–15.5)
RDW: 11.9 % (ref 11.5–15.5)
WBC: 11 K/uL — ABNORMAL HIGH (ref 4.0–10.5)
WBC: 9.5 K/uL (ref 4.0–10.5)
nRBC: 0 % (ref 0.0–0.2)
nRBC: 0 % (ref 0.0–0.2)

## 2024-06-22 MED ORDER — ONDANSETRON HCL 4 MG PO TABS: 4.0000 mg | ORAL_TABLET | Freq: Three times a day (TID) | ORAL | 0 refills | Status: AC | PRN

## 2024-06-22 MED ORDER — METHOCARBAMOL 750 MG PO TABS: 750.0000 mg | ORAL_TABLET | Freq: Four times a day (QID) | ORAL | 0 refills | Status: AC

## 2024-06-22 MED ORDER — HYDROCODONE-ACETAMINOPHEN 5-325 MG PO TABS: 1.0000 | ORAL_TABLET | Freq: Four times a day (QID) | ORAL | 0 refills | Status: AC | PRN

## 2024-06-22 MED ORDER — POLYETHYLENE GLYCOL 3350 17 G PO PACK: 17.0000 g | PACK | Freq: Every day | ORAL | 0 refills | Status: AC

## 2024-06-22 NOTE — Plan of Care (Signed)
 Pain and nausea management Dressing change IVF infusing   Problem: Bowel/Gastric: Goal: Gastrointestinal status for postoperative course will improve Outcome: Progressing   Problem: Cardiac: Goal: Will show no evidence of cardiac arrhythmias Outcome: Progressing

## 2024-06-22 NOTE — Discharge Instructions (Signed)
 Discharge instructions following PCNL  Call your doctor for: Fevers greater than 100.5 Severe nausea or vomiting Increasing pain not controlled by pain medication Increasing redness or drainage from incisions Decreased urine output or a catheter is no longer draining  The number for questions is [phone].  Activity: Gradually increase activity with short frequent walks, 3-4 times a day.  Avoid strenuous activities, like sports, lawn-mowing, or heavy lifting (more than 10-15 pounds) for 3 weeks after sugery.  Wear loose, comfortable clothing that pull or kink the tube or tubes.  Do not drive while taking pain medication, or until your doctor permitts it.  Bathing and dressing changes: You should not shower for 48 hours after surgery.  Do not soak your back in a bathtub or submerge the incision in water  for 2 weeks.    Diet: It is extremely important to drink plenty of fluids after surgery, especially water .  You may resume your regular diet, unless otherwise instructed.  Medications: May take Tylenol  (acetaminophen ) or ibuprofen  (Advil , Motrin ) as directed over-the-counter. Take any prescriptions as directed.  Follow-up appointments: Follow-up appointment will be scheduled with Dr. Cam in 10-14 days for hospital check and stent removal.

## 2024-06-22 NOTE — Progress Notes (Signed)
 1 Day Post-Op Subjective: Pt having pain R side, had emesis per patient 2x overnight. Complained of rough night.   Objective: Vital signs in last 24 hours: Temp:  [98 F (36.7 C)-98.7 F (37.1 C)] 98.6 F (37 C) (12/31 0451) Pulse Rate:  [60-94] 60 (12/31 0451) Resp:  [12-22] 16 (12/31 0451) BP: (94-166)/(51-100) 95/56 (12/31 0451) SpO2:  [96 %-100 %] 100 % (12/31 0451) Weight:  [52.2 kg-55.2 kg] 55.2 kg (12/30 1955)  Intake/Output from previous day: 12/30 0701 - 12/31 0700 In: 2405.3 [P.O.:25; I.V.:2273.8; IV Piggyback:106.5] Out: 850 [Urine:825; Blood:25] Intake/Output this shift: No intake/output data recorded.  Physical Exam:  General: Alert and oriented CV: RRR Lungs: Clear Abdomen: Soft, some R sided tenderness not distended GU: foley with clear yellow urine Back: no bleeding or drainage from back incision   Lab Results: Recent Labs    06/21/24 1125 06/21/24 1912 06/22/24 0419  HGB 11.5* 11.0* 9.3*  HCT 35.1* 32.9* 27.9*   BMET Recent Labs    06/21/24 1632 06/22/24 0419  NA 140 139  K 4.1 4.1  CL 106 106  CO2 23 23  GLUCOSE 86 108*  BUN 20 18  CREATININE 0.72 0.59  CALCIUM  9.1 8.8*     Studies/Results: DG Chest Port 1 View Result Date: 06/21/2024 EXAM: 1 VIEW(S) XRAY OF THE CHEST 06/21/2024 04:57:00 PM COMPARISON: Chest x-ray dated 02/13/2021. CLINICAL HISTORY: Kidney stone. FINDINGS: LUNGS AND PLEURA: No focal pulmonary opacity. No pleural effusion. No pneumothorax. HEART AND MEDIASTINUM: No acute abnormality of the cardiac and mediastinal silhouettes. BONES AND SOFT TISSUES: No acute osseous abnormality. IMPRESSION: 1. No acute process. Electronically signed by: Greig Pique MD 06/21/2024 05:57 PM EST RP Workstation: [id number]   DG C-Arm 1-60 Min-No Report Result Date: 06/21/2024 Fluoroscopy was utilized by the requesting physician.  No radiographic interpretation.   IR URETERAL STENT LEFT NEW ACCESS W/O SEP NEPHROSTOMY CATH Result Date:  06/21/2024 INDICATION: 33 year old female with history of right staghorn nephrolithiasis with planned PCNL. EXAM: 1. Ultrasound and FLUOROSCOPIC GUIDANCE FOR PUNCTURE OF THE right SIDED RENAL COLLECTING SYSTEM. 2. Ultrasound and FLUOROSCOPIC GUIDED PLACEMENT OF A right SIDED NEPHROURETERAL CATHETER. COMPARISON:  CT abdomen pelvis from 04/20/2024 MEDICATIONS: Rocephin  1 gm IV; The antibiotic was administered in an appropriate time frame prior to skin puncture. ANESTHESIA/SEDATION: Moderate (conscious) sedation was employed during this procedure. A total of Versed  2 mg and Fentanyl  125 mcg was administered intravenously. Moderate Sedation Time: 17 minutes. The patient's level of consciousness and vital signs were monitored continuously by radiology nursing throughout the procedure under my direct supervision. CONTRAST:  20mL OMNIPAQUE  IOHEXOL  300 MG/ML SOLN - Administered into the renal collecting system. FLUOROSCOPY TIME:  Nine mGy reference air kerma COMPLICATIONS: None immediate. PROCEDURE: Informed written consent was obtained from the patient after a discussion of the risks, benefits, and alternatives to treatment. The right flank region was prepped with Betadine in a sterile fashion, and a sterile drape was applied covering the operative field. A sterile gown and sterile gloves were used for the procedure. A timeout was performed prior to the initiation of the procedure. A pre procedural spot fluoroscopic image was obtained of the upper abdomen. Ultrasound scanning performed of the kidney was negative for significant hydronephrosis. The stone within right superior pole was targeted sonographically with a 22 gauge Chiba needle. Access to the collecting system was confirmed with advancement of a Nitrex wire into the collecting system. The needle was exchanged for the inner 3 French catheter from an Designer, jewellery  and contrast injection confirmed access. An Accustick set was utilized to dilate the tract and was  subsequently exchanged for a Kumpe catheter over a Bentson wire. The Kumpe catheter was advanced down the ureter and into the urinary bladder. Postprocedural spot radiographs were obtained in various obliquities and the catheter was sutured to the skin. The catheter was capped and a dressing was placed. The patient tolerated the procedure well without immediate post procedural complication. FINDINGS: Pre procedural spot radiographic images demonstrates right upper pole nephrolithiasis and similar appearance of indwelling double-J nephroureteral stent. With the collecting system now opacified, a posterior, superior calyx was targeted sonographically allowing placement of a Kumpe catheter through the calyx with tip advanced into the urinary bladder. IMPRESSION: Successful ultrasound fluoroscopic guided placement of a right sided 5 French Kumpe catheter to the level of the urinary bladder to be utilized during impending nephrolithotomy procedure. Ester Sides, MD [facility] Radiology Electronically Signed   By: Ester Sides M.D.   On: 06/21/2024 13:55    Assessment/Plan: 33 F w/ retained R renal stent from several years ago attempted PCNL on 12/30 unable to maintain access.   # Post PCNL - will need eventual repeat PCNL to attempt to remove stnet - Hct dropped to 27 this AM also having pain  - will repeat CBC at 1PM to see if needs blood - if doing ok this afternoon will DC.    LOS: 0 days   Jackey Pea MD 06/22/2024, 7:01 AM [facility]

## 2024-06-22 NOTE — Progress Notes (Signed)
 PT still having nausea. Labs stable ok to DC from vitals and labs standpoint. Keep for one additional night and DC in AM if nausea improving.

## 2024-06-23 DIAGNOSIS — T83192A Other mechanical complication of urinary stent, initial encounter: Secondary | ICD-10-CM | POA: Diagnosis not present

## 2024-06-23 LAB — CBC
HCT: 29.9 % — ABNORMAL LOW (ref 36.0–46.0)
Hemoglobin: 9.9 g/dL — ABNORMAL LOW (ref 12.0–15.0)
MCH: 31.8 pg (ref 26.0–34.0)
MCHC: 33.1 g/dL (ref 30.0–36.0)
MCV: 96.1 fL (ref 80.0–100.0)
Platelets: 263 K/uL (ref 150–400)
RBC: 3.11 MIL/uL — ABNORMAL LOW (ref 3.87–5.11)
RDW: 11.9 % (ref 11.5–15.5)
WBC: 6.7 K/uL (ref 4.0–10.5)
nRBC: 0 % (ref 0.0–0.2)

## 2024-06-23 LAB — BASIC METABOLIC PANEL WITH GFR
Anion gap: 10 (ref 5–15)
BUN: 14 mg/dL (ref 6–20)
CO2: 23 mmol/L (ref 22–32)
Calcium: 9 mg/dL (ref 8.9–10.3)
Chloride: 104 mmol/L (ref 98–111)
Creatinine, Ser: 0.69 mg/dL (ref 0.44–1.00)
GFR, Estimated: >60 mL/min
Glucose, Bld: 85 mg/dL (ref 70–99)
Potassium: 3.7 mmol/L (ref 3.5–5.1)
Sodium: 137 mmol/L (ref 135–145)

## 2024-06-23 NOTE — Discharge Summary (Signed)
 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 Briefly, Colleen Oneill is a 34 y.o. year old patient with retained right ureteral stent.   Hospital Course:  -She underwent attempted right PCNL for retained right ureteral stent however unable to access kidney -  She was then transferred to the floor after an uneventful PACU stay. -  She had persistent nausea/emesis following procedure -On POD#2 she had met discharge criteria: was tolerating PO, was up and ambulating independently,  pain was well controlled, was voiding without a catheter, and was ready to for discharge.   NAD Vitals:   06/22/24 1537 06/22/24 2105 06/22/24 2342 06/23/24 0356  BP: 109/76 (!) 107/59 (!) 100/56 103/63  Pulse: 60 64 62 74  Resp: 18 16 16 16   Temp: 98.3 F (36.8 C) 98.6 F (37 C) 98.8 F (37.1 C) 98.7 F (37.1 C)  TempSrc: Oral Oral Oral Oral  SpO2: 100% 98% 100% 100%  Weight:      Height:         Intake/Output Summary (Last 24 hours) at 06/23/2024 0859 Last data filed at 06/22/2024 2200 Gross per 24 hour  Intake 820 ml  Output 379 ml  Net 441 ml     Non-labored breathing Abdomen appropriately tender Incision c/d/I with tegaderm on right back Extremity symmetric    Laboratory values:  Recent Labs    06/22/24 0419 06/22/24 1247 06/23/24 0422  WBC 11.0* 9.5 6.7  HGB 9.3* 9.5* 9.9*  HCT 27.9* 27.8* 29.9*   Recent Labs    06/21/24 1632 06/22/24 0419 06/23/24 0422  NA 140 139 137  K 4.1 4.1 3.7  CL 106 106 104  CO2 23 23 23   GLUCOSE 86 108* 85  BUN 20 18 14   CREATININE 0.72 0.59 0.69  CALCIUM  9.1 8.8* 9.0   Recent Labs    06/21/24 1125  INR 1.0   No results for input(s): LABURIN in the last 72 hours. Results for orders placed or performed during the hospital encounter of 04/20/24  Urine Culture     Status: Abnormal   Collection Time: 04/20/24  8:33 AM   Specimen: Urine, Clean Catch  Result Value Ref Range Status   Specimen Description   Final    URINE, CLEAN CATCH Performed at [facility], [address], [place], [place] [address]    Special Requests   Final    NONE Performed at [facility], [address] [place], [place] [address]    Culture MULTIPLE SPECIES PRESENT, SUGGEST RECOLLECTION (A)  Final   Report Status 04/22/2024 FINAL  Final    Disposition: Home  Discharge instruction: The patient was instructed to be ambulatory but told to refrain from heavy lifting, strenuous activity,  or driving.   Discharge medications:  Allergies as of 06/23/2024   No Known Allergies      Medication List     TAKE these medications    acetaminophen  325 MG tablet Commonly known as: TYLENOL  Take 650 mg by mouth every 6 (six) hours as needed (pain.).   bisacodyl  5 MG EC tablet Generic drug: bisacodyl  Take 1 tablet (5 mg total) by mouth daily as needed for moderate constipation.   HYDROcodone -acetaminophen  5-325 MG tablet Commonly known as: NORCO/VICODIN Take 1 tablet by mouth every 6 (six) hours as needed for up to 10 doses.   methocarbamol  750 MG  tablet Commonly known as: ROBAXIN  Take 1 tablet (750 mg total) by mouth 4 (four) times daily for 20 doses.   ondansetron  4 MG tablet Commonly known as: ZOFRAN  Take 1 tablet (4 mg total) by mouth every 8 (eight) hours as needed for up to 20 doses for nausea or vomiting.   oxyCODONE -acetaminophen  5-325 MG tablet Commonly known as: Percocet Take 1 tablet by mouth every 4 (four) hours as needed for severe pain (pain score 7-10).   polyethylene glycol powder 17 GM/SCOOP powder Commonly known as: GLYCOLAX /MIRALAX  Dissolve 1 capful (17g) in 4-8 ounces of liquid and take by mouth daily. What changed: Another medication with the same name was added. Make sure you understand how and when to take each.   polyethylene glycol 17 g packet Commonly known as: MiraLax  Take 17 g by mouth daily. What changed: You were already taking a medication with the same name, and this prescription was added. Make sure you understand how and when to take each.   sulfamethoxazole -trimethoprim  800-160 MG tablet Commonly known as: Bactrim  DS Take 1 tablet by mouth 2 (two) times daily.               Discharge Care Instructions  (From admission, onward)           Start     Ordered   06/23/24 0000  Discharge wound care:       Comments: The dressing on your back can be changed every 24 hours.  Once it stopped leaking the dressing can be removed.   06/23/24 0859            Followup:   Follow-up Information     Shane Steffan BROCKS, MD Follow up.   Specialty: Urology Why: you will be called for repeat PCNL with Dr. Shane and Dr. Cam Pass information: [address] [place] [place] [address] [phone]

## 2024-06-23 NOTE — Progress Notes (Signed)
 Discharge instructions reviewed with patient, verbalized understanding. All questions answered. All belongings accounted for. Patient to follow up with MD in  1-2 weeks. PIV removed.   Patient getting dressed and will be transported home by S/o via personal transportation.

## 2024-06-23 NOTE — Progress Notes (Signed)
 This RN was told by urology to wait until around lunch time to ensure that patient has no emesis before discharging patient. At this time, patient has had no emesis this morning or overnight.

## 2024-06-23 NOTE — Progress Notes (Signed)
" °   06/23/24 0908  TOC Brief Assessment  Insurance and Status Reviewed  Patient has primary care physician No (Patient is assigned to [facility] through [facility])  Home environment has been reviewed Resides in an apartment  Prior level of function: Independent with ADLs at baseline  Prior/Current Home Services No current home services  Social Drivers of Health Review SDOH reviewed no interventions necessary  Readmission risk has been reviewed Yes  Transition of care needs no transition of care needs at this time    "

## 2024-06-28 ENCOUNTER — Other Ambulatory Visit: Payer: Self-pay | Admitting: Urology

## 2024-06-28 MED ORDER — TRANEXAMIC ACID 1000 MG/10ML IV SOLN: 1000.0000 mg | INTRAVENOUS | Status: AC

## 2024-08-04 ENCOUNTER — Encounter (HOSPITAL_COMMUNITY): Admission: RE | Admit: 2024-08-04

## 2024-08-11 ENCOUNTER — Ambulatory Visit (HOSPITAL_COMMUNITY): Admit: 2024-08-11 | Admitting: Urology

## 2024-08-11 SURGERY — NEPHROLITHOTOMY
Anesthesia: General | Laterality: Right
